# Patient Record
Sex: Male | Born: 1955
Health system: Southern US, Community
[De-identification: ages and names within clinical notes are randomized; demographics above are authoritative.]

## PROBLEM LIST (undated history)

## (undated) DIAGNOSIS — K05329 Chronic periodontitis, generalized, unspecified severity: Secondary | ICD-10-CM

## (undated) DIAGNOSIS — Z7289 Other problems related to lifestyle: Secondary | ICD-10-CM

## (undated) DIAGNOSIS — K089 Disorder of teeth and supporting structures, unspecified: Secondary | ICD-10-CM

## (undated) DIAGNOSIS — M545 Other chronic pain: Secondary | ICD-10-CM

## (undated) DIAGNOSIS — Z789 Other specified health status: Secondary | ICD-10-CM

## (undated) DIAGNOSIS — F1090 Alcohol use, unspecified, uncomplicated: Secondary | ICD-10-CM

## (undated) DIAGNOSIS — K769 Liver disease, unspecified: Secondary | ICD-10-CM

## (undated) DIAGNOSIS — F1410 Cocaine abuse, uncomplicated: Secondary | ICD-10-CM

## (undated) DIAGNOSIS — M1990 Unspecified osteoarthritis, unspecified site: Secondary | ICD-10-CM

## (undated) DIAGNOSIS — M2042 Other hammer toe(s) (acquired), left foot: Secondary | ICD-10-CM

## (undated) DIAGNOSIS — M4307 Spondylolysis, lumbosacral region: Secondary | ICD-10-CM

## (undated) DIAGNOSIS — K219 Gastro-esophageal reflux disease without esophagitis: Secondary | ICD-10-CM

## (undated) DIAGNOSIS — E785 Hyperlipidemia, unspecified: Secondary | ICD-10-CM

## (undated) DIAGNOSIS — F419 Anxiety disorder, unspecified: Secondary | ICD-10-CM

## (undated) DIAGNOSIS — K08109 Complete loss of teeth, unspecified cause, unspecified class: Secondary | ICD-10-CM

## (undated) DIAGNOSIS — G629 Polyneuropathy, unspecified: Secondary | ICD-10-CM

## (undated) DIAGNOSIS — F1210 Cannabis abuse, uncomplicated: Secondary | ICD-10-CM

## (undated) DIAGNOSIS — I700 Atherosclerosis of aorta: Secondary | ICD-10-CM

## (undated) DIAGNOSIS — Z720 Tobacco use: Secondary | ICD-10-CM

## (undated) DIAGNOSIS — M4722 Other spondylosis with radiculopathy, cervical region: Secondary | ICD-10-CM

## (undated) DIAGNOSIS — F1910 Other psychoactive substance abuse, uncomplicated: Secondary | ICD-10-CM

## (undated) DIAGNOSIS — M4712 Other spondylosis with myelopathy, cervical region: Secondary | ICD-10-CM

## (undated) DIAGNOSIS — G8929 Other chronic pain: Secondary | ICD-10-CM

## (undated) DIAGNOSIS — N529 Male erectile dysfunction, unspecified: Secondary | ICD-10-CM

## (undated) DIAGNOSIS — F32A Depression, unspecified: Secondary | ICD-10-CM

## (undated) HISTORY — DX: Liver disease, unspecified: K76.9

## (undated) HISTORY — DX: Hyperlipidemia, unspecified: E78.5

## (undated) HISTORY — DX: Tobacco use: Z72.0

## (undated) HISTORY — DX: Other problems related to lifestyle: Z72.89

## (undated) HISTORY — DX: Polyneuropathy, unspecified: G62.9

## (undated) HISTORY — PX: NO PAST SURGERIES: SHX2092

## (undated) HISTORY — DX: Anxiety disorder, unspecified: F41.9

## (undated) HISTORY — DX: Alcohol use, unspecified, uncomplicated: F10.90

## (undated) HISTORY — DX: Other specified health status: Z78.9

---

## 2002-01-12 ENCOUNTER — Emergency Department (HOSPITAL_COMMUNITY): Admission: EM | Admit: 2002-01-12 | Discharge: 2002-01-12 | Payer: Self-pay | Admitting: Emergency Medicine

## 2002-01-12 ENCOUNTER — Encounter: Payer: Self-pay | Admitting: Emergency Medicine

## 2003-11-16 ENCOUNTER — Emergency Department (HOSPITAL_COMMUNITY): Admission: EM | Admit: 2003-11-16 | Discharge: 2003-11-16 | Payer: Self-pay | Admitting: Emergency Medicine

## 2006-05-22 ENCOUNTER — Emergency Department (HOSPITAL_COMMUNITY): Admission: EM | Admit: 2006-05-22 | Discharge: 2006-05-22 | Payer: Self-pay | Admitting: Emergency Medicine

## 2007-07-08 ENCOUNTER — Emergency Department (HOSPITAL_COMMUNITY): Admission: EM | Admit: 2007-07-08 | Discharge: 2007-07-08 | Payer: Self-pay | Admitting: Emergency Medicine

## 2010-06-22 ENCOUNTER — Other Ambulatory Visit: Payer: Self-pay | Admitting: Emergency Medicine

## 2010-06-22 ENCOUNTER — Ambulatory Visit (HOSPITAL_COMMUNITY)
Admission: RE | Admit: 2010-06-22 | Discharge: 2010-06-22 | Disposition: A | Discharge status: Home / Self Care | Payer: Self-pay | Source: Ambulatory Visit | Attending: Emergency Medicine | Admitting: Emergency Medicine

## 2010-06-22 ENCOUNTER — Other Ambulatory Visit: Payer: Self-pay

## 2010-06-22 ENCOUNTER — Inpatient Hospital Stay (INDEPENDENT_AMBULATORY_CARE_PROVIDER_SITE_OTHER)
Admission: RE | Admit: 2010-06-22 | Discharge: 2010-06-22 | Disposition: A | Discharge status: Home / Self Care | Payer: Self-pay | Source: Ambulatory Visit | Attending: Emergency Medicine | Admitting: Emergency Medicine

## 2010-06-22 ENCOUNTER — Other Ambulatory Visit (HOSPITAL_COMMUNITY): Payer: Self-pay | Admitting: Emergency Medicine

## 2010-06-22 DIAGNOSIS — K2990 Gastroduodenitis, unspecified, without bleeding: Secondary | ICD-10-CM

## 2010-06-22 DIAGNOSIS — R1013 Epigastric pain: Secondary | ICD-10-CM | POA: Insufficient documentation

## 2010-06-22 LAB — POCT URINALYSIS DIPSTICK
Hgb urine dipstick: NEGATIVE
Ketones, ur: NEGATIVE mg/dL
Protein, ur: NEGATIVE mg/dL
Specific Gravity, Urine: 1.02 (ref 1.005–1.030)
Urobilinogen, UA: 0.2 mg/dL (ref 0.0–1.0)
pH: 5 (ref 5.0–8.0)

## 2010-06-22 LAB — LIPASE, BLOOD: Lipase: 28 U/L (ref 11–59)

## 2010-06-26 LAB — POCT I-STAT, CHEM 8
BUN: 9 mg/dL (ref 6–23)
Calcium, Ion: 1.1 mmol/L — ABNORMAL LOW (ref 1.12–1.32)
Chloride: 105 mEq/L (ref 96–112)
Creatinine, Ser: 1.4 mg/dL (ref 0.4–1.5)
Glucose, Bld: 117 mg/dL — ABNORMAL HIGH (ref 70–99)
HCT: 52 % (ref 39.0–52.0)
Potassium: 3.7 mEq/L (ref 3.5–5.1)

## 2010-11-11 ENCOUNTER — Emergency Department (HOSPITAL_COMMUNITY): Payer: Self-pay

## 2010-11-11 ENCOUNTER — Emergency Department (HOSPITAL_COMMUNITY)
Admission: EM | Admit: 2010-11-11 | Discharge: 2010-11-11 | Disposition: A | Discharge status: Home / Self Care | Payer: Self-pay | Attending: Emergency Medicine | Admitting: Emergency Medicine

## 2010-11-11 DIAGNOSIS — S40019A Contusion of unspecified shoulder, initial encounter: Secondary | ICD-10-CM | POA: Insufficient documentation

## 2010-11-11 DIAGNOSIS — S8010XA Contusion of unspecified lower leg, initial encounter: Secondary | ICD-10-CM | POA: Insufficient documentation

## 2010-11-11 DIAGNOSIS — Y92009 Unspecified place in unspecified non-institutional (private) residence as the place of occurrence of the external cause: Secondary | ICD-10-CM | POA: Insufficient documentation

## 2010-11-11 DIAGNOSIS — M549 Dorsalgia, unspecified: Secondary | ICD-10-CM | POA: Insufficient documentation

## 2010-11-11 DIAGNOSIS — W1789XA Other fall from one level to another, initial encounter: Secondary | ICD-10-CM | POA: Insufficient documentation

## 2010-11-11 DIAGNOSIS — M79609 Pain in unspecified limb: Secondary | ICD-10-CM | POA: Insufficient documentation

## 2010-11-11 DIAGNOSIS — M25519 Pain in unspecified shoulder: Secondary | ICD-10-CM | POA: Insufficient documentation

## 2015-02-16 ENCOUNTER — Emergency Department (HOSPITAL_COMMUNITY): Payer: Self-pay

## 2015-02-16 ENCOUNTER — Other Ambulatory Visit: Payer: Self-pay

## 2015-02-16 ENCOUNTER — Emergency Department (HOSPITAL_COMMUNITY)
Admission: EM | Admit: 2015-02-16 | Discharge: 2015-02-16 | Disposition: A | Discharge status: Home / Self Care | Payer: Self-pay | Attending: Emergency Medicine | Admitting: Emergency Medicine

## 2015-02-16 ENCOUNTER — Encounter (HOSPITAL_COMMUNITY): Payer: Self-pay | Admitting: *Deleted

## 2015-02-16 DIAGNOSIS — R200 Anesthesia of skin: Secondary | ICD-10-CM

## 2015-02-16 DIAGNOSIS — M5415 Radiculopathy, thoracolumbar region: Secondary | ICD-10-CM | POA: Insufficient documentation

## 2015-02-16 DIAGNOSIS — Z7982 Long term (current) use of aspirin: Secondary | ICD-10-CM | POA: Insufficient documentation

## 2015-02-16 DIAGNOSIS — G629 Polyneuropathy, unspecified: Secondary | ICD-10-CM | POA: Insufficient documentation

## 2015-02-16 DIAGNOSIS — G930 Cerebral cysts: Secondary | ICD-10-CM | POA: Insufficient documentation

## 2015-02-16 LAB — COMPREHENSIVE METABOLIC PANEL
ALBUMIN: 3.6 g/dL (ref 3.5–5.0)
ALK PHOS: 60 U/L (ref 38–126)
ALT: 67 U/L — ABNORMAL HIGH (ref 17–63)
ANION GAP: 10 (ref 5–15)
AST: 71 U/L — ABNORMAL HIGH (ref 15–41)
BUN: 10 mg/dL (ref 6–20)
CHLORIDE: 108 mmol/L (ref 101–111)
CO2: 23 mmol/L (ref 22–32)
Calcium: 8.8 mg/dL — ABNORMAL LOW (ref 8.9–10.3)
Creatinine, Ser: 1.01 mg/dL (ref 0.61–1.24)
GFR calc non Af Amer: >60 mL/min (ref >60)
GLUCOSE: 104 mg/dL — AB (ref 65–99)
POTASSIUM: 4.1 mmol/L (ref 3.5–5.1)
SODIUM: 141 mmol/L (ref 135–145)
Total Bilirubin: 0.5 mg/dL (ref 0.3–1.2)
Total Protein: 6.2 g/dL — ABNORMAL LOW (ref 6.5–8.1)

## 2015-02-16 LAB — CBC
HCT: 45.4 % (ref 39.0–52.0)
Hemoglobin: 15.6 g/dL (ref 13.0–17.0)
MCH: 31.2 pg (ref 26.0–34.0)
MCHC: 34.4 g/dL (ref 30.0–36.0)
MCV: 90.8 fL (ref 78.0–100.0)
PLATELETS: 199 10*3/uL (ref 150–400)
RBC: 5 MIL/uL (ref 4.22–5.81)
RDW: 15.4 % (ref 11.5–15.5)
WBC: 5.5 10*3/uL (ref 4.0–10.5)

## 2015-02-16 LAB — DIFFERENTIAL
BASOS PCT: 0 %
Basophils Absolute: 0 10*3/uL (ref 0.0–0.1)
EOS PCT: 2 %
Eosinophils Absolute: 0.1 10*3/uL (ref 0.0–0.7)
LYMPHS PCT: 23 %
Lymphs Abs: 1.3 10*3/uL (ref 0.7–4.0)
MONO ABS: 0.7 10*3/uL (ref 0.1–1.0)
Monocytes Relative: 13 %
NEUTROS PCT: 61 %
Neutro Abs: 3.4 10*3/uL (ref 1.7–7.7)

## 2015-02-16 LAB — APTT: aPTT: 25 seconds (ref 24–37)

## 2015-02-16 LAB — I-STAT TROPONIN, ED: Troponin i, poc: 0.01 ng/mL (ref 0.00–0.08)

## 2015-02-16 LAB — PROTIME-INR
INR: 1 (ref 0.00–1.49)
PROTHROMBIN TIME: 13.4 s (ref 11.6–15.2)

## 2015-02-16 MED ORDER — IBUPROFEN 800 MG PO TABS: 800.0000 mg | ORAL_TABLET | Freq: Three times a day (TID) | ORAL | Status: DC

## 2015-02-16 MED ORDER — LORAZEPAM 2 MG/ML IJ SOLN
1.0000 mg | Freq: Once | INTRAMUSCULAR | Status: AC
  Administered 2015-02-16: 1 mg via INTRAVENOUS
  Filled 2015-02-16: qty 1

## 2015-02-16 NOTE — ED Notes (Addendum)
Patient reports left sided weakness and numbness x1 year. Patient reports he decided to come to the ED today because he had someone to keep his children so he was able to get here. Patient ambulated to room, speech clear, resp e/u. Patient alert,oriented x4.

## 2015-02-16 NOTE — ED Notes (Signed)
Pt reports feeling like left arm and leg have been weaker x 1 YEAR. No other complaints, no acute distress noted at triage.

## 2015-02-16 NOTE — ED Notes (Signed)
Patient transported to MRI 

## 2015-02-16 NOTE — Discharge Instructions (Signed)

## 2015-02-16 NOTE — ED Provider Notes (Signed)
CSN: [id number]     Arrival date & time 02/16/15  7829 History   First MD Initiated Contact with Patient 02/16/15 1010     Chief Complaint  Patient presents with  . Weakness     (Consider location/radiation/quality/duration/timing/severity/associated sxs/prior Treatment) HPI   The patient is a 59 year old male, current smoker and daily EtOH user, otherwise denies any past medical history or daily medication, reports emergency department for evaluation of left-sided weakness and numbness which she has had for 1 year. He also complains of incontinence and intermittent numbness and paresthesias shooting from his left leg to his left hip and throughout his pelvis and groin. He also does admit to fecal incontinence. He has some low back pain which she says has been there for some time. He complains of leg weakness and pain which is exacerbated with walking. He is also complaining of pins and needle sensation to his left hand. He denies any trauma or strain to his back. He denies any IV drug use, fever, rash, lightheadedness, slurred speech, facial asymmetry.  History reviewed. No pertinent past medical history. History reviewed. No pertinent past surgical history. History reviewed. No pertinent family history. Social History  Substance Use Topics  . Smoking status: Unknown If Ever Smoked  . Smokeless tobacco: None  . Alcohol Use: No    Review of Systems  10 Systems reviewed and are negative for acute change except as noted in the HPI.     Allergies  Review of patient's allergies indicates no known allergies.  Home Medications   Prior to Admission medications   Medication Sig Start Date End Date Taking? Authorizing Kale Dols  aspirin 81 MG tablet Take 243-324 mg by mouth daily as needed for pain.   Yes Historical Lizzy Hamre, MD  Multiple Vitamin (MULTIVITAMIN WITH MINERALS) TABS tablet Take 1 tablet by mouth daily.   Yes Historical Jovi Alvizo, MD   BP 138/89 mmHg  Pulse 79  Temp(Src)  98 F (36.7 C)  Resp 16  Ht 5' 11.5" (1.816 m)  Wt 168 lb (76.204 kg)  BMI 23.11 kg/m2  SpO2 94% Physical Exam  Constitutional: He is oriented to person, place, and time. He appears well-developed and well-nourished. No distress.  Thin male, appears older than stated age, appears chronically ill but nontoxic-appearing  HENT:  Head: Normocephalic and atraumatic.  Nose: Nose normal.  Mouth/Throat: Oropharynx is clear and moist. No oropharyngeal exudate.  Eyes: Conjunctivae and EOM are normal. Pupils are equal, round, and reactive to light. Right eye exhibits no discharge. Left eye exhibits no discharge. No scleral icterus.  Neck: Normal range of motion. No JVD present. No tracheal deviation present. No thyromegaly present.  Cardiovascular: Normal rate, regular rhythm, normal heart sounds and intact distal pulses.  Exam reveals no gallop and no friction rub.   No murmur heard. 2+ pulses palpated in all extremities, femoral pulses palpated 2+, no bruits auscultated. No carotid bruit.  No pallor  Pulmonary/Chest: Effort normal and breath sounds normal. No respiratory distress. He has no wheezes. He has no rales. He exhibits no tenderness.  Clubbing, no cyanosis  Abdominal: Soft. Bowel sounds are normal. He exhibits no distension and no mass. There is no tenderness. There is no rebound and no guarding.  Musculoskeletal: Normal range of motion. He exhibits no edema or tenderness.  No tenderness. Patient along spinal processes from cervical to lumbar spine. No step-off palpated, no paraspinal tenderness. Normal range of motion range of motion of the back, left and right hip.  No tenderness to the left buttocks, SI joint, IT band.  Lymphadenopathy:    He has no cervical adenopathy.  Neurological: He is alert and oriented to person, place, and time. He has normal reflexes. No cranial nerve deficit. He exhibits normal muscle tone. Coordination normal.  Negative Romberg, normal tone, normal strength  and bilateral lower extremities with dorsiflexion and plantarflexion. Bilateral lower and upper extremity sensation intact to sharp/dull. Patient ambulated without difficulty. Speech is clear and goal oriented.  Cranial nerves II through XII grossly intact.  Skin: Skin is warm and dry. No rash noted. He is not diaphoretic. No erythema. No pallor.  Psychiatric: He has a normal mood and affect. His behavior is normal. Judgment and thought content normal.  Nursing note and vitals reviewed.   ED Course  Procedures (including critical care time) Labs Review Labs Reviewed  COMPREHENSIVE METABOLIC PANEL - Abnormal; Notable for the following:    Glucose, Bld 104 (*)    Calcium 8.8 (*)    Total Protein 6.2 (*)    AST 71 (*)    ALT 67 (*)    All other components within normal limits  PROTIME-INR  APTT  CBC  DIFFERENTIAL  I-STAT TROPOININ, ED    Imaging Review Ct Head Wo Contrast  02/16/2015   CLINICAL DATA:  LEFT leg numbness for 1 year. No known injury. Back pain.  EXAM: CT HEAD WITHOUT CONTRAST  TECHNIQUE: Contiguous axial images were obtained from the base of the skull through the vertex without intravenous contrast.  COMPARISON:  None.  FINDINGS: No acute intracranial hemorrhage. No CT evidence of acute infarction. No midline shift or mass effect. No hydrocephalus. Basilar cisterns are patent.  Fluid density lesion expands the sella turcica measuring 20 mm x 20 mm. This lesion extends superiorly into the suprasellar space slightly RIGHT of midline with the superior aspect of lesion measuring 21 mm x 15 mm on image 13. No evidence of edema or mass effect associated with lesion. No evidence of internal lipid or or calcification.  Paranasal sinuses and  mastoid air cells are clear.  IMPRESSION: 1. No acute scrotal findings. 2. 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 As lesion is indeterminate by CT imaging and no comparison available,  recommend MRI with contrast in nonemergent setting for further evaluation.   Electronically Signed   By: Genevive Bi M.D.   On: 02/16/2015 13:23   I have personally reviewed and evaluated these images and lab results as part of my medical decision-making.   EKG Interpretation   Date/Time:  Wednesday February 16 2015 09:43:39 EDT Ventricular Rate:  96 PR Interval:  142 QRS Duration: 86 QT Interval:  368 QTC Calculation: 464 R Axis:   34 Text Interpretation:  Normal sinus rhythm Minimal voltage criteria for  LVH, may be normal variant Borderline ECG Confirmed by Lincoln Brigham [id number])  on 02/16/2015 11:19:14 AM      MDM   Final diagnoses:  Numbness    Patient with complaint of weakness on his left side which has been unchanged for over a year, associated with left low back pain, some sciatica features, also has intermittent urinary incontinence without any sensation of his loss of bowel or bladder. He is also complaining of paresthesias and peripheral neuropathy symptoms. He does not appear to be healthy male, patient of his neuropathy symptoms are from an occult diabetes or more likely due to frequent alcohol use.  The patient's findings and  history were reviewed with Dr. Madilyn Hook. Given the chronic nature of his complaint, she was consulted to advise on need for MRI to evaluate for cauda equina.  After exam she advised to do head CT and lumbar spine MRI. Her findings were that his weakness and numbness were limited to his left side.  Stroke workup labs were initiated with his presentation.  The patient's lab work as large unremarkable, normal blood counts, normal chemistry. Mildly elevated AST/ALT. Patient has no gap, no course of hyperglycemia, normal kidney function.  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 His head CT is significant for a cystic lesion,  with recommended nonemergent MRI with contrast for follow-up and further evaluation. The patient will be given neurosurgery referral, which may need to [facility] for further management of his chronic back pain and numbness. 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  Patient was notified of all of his findings, return precautions given. Patient verbalized understanding.   Filed Vitals:   02/16/15 1611 02/16/15 1615 02/16/15 1630 02/16/15 1645  BP:  149/96 149/99 150/98  Pulse:  75 71 67  Temp: 98 F (36.7 C)     Resp:      Height:      Weight:      SpO2:  96% 100% 97%     Pt discharged with outpt neurosurgery and neurology follow up arranged with assistance from CM (much appreciated).  Pt given NSAID for back pain.  He has a initial follow-up appointment with internal medicine.      Danelle Berry, PA-C 02/18/15 0319  Tilden Fossa, MD 02/19/15 [phone]

## 2015-02-16 NOTE — ED Notes (Signed)
MRI called stating they were getting patient ready for his scan and patient stated that he was claustrophobic. PA Danelle Berry made aware, advised patient can get  of ativan.

## 2015-02-16 NOTE — Care Management Note (Signed)
Case Management Note  Patient Details  Name: Blaire Hodsdon MRN: [id number] Date of Birth: 03-17-1956  Subjective/Objective:                  59 yo male Pt reports feeling like left arm and leg have been weaker x 1 YEAR. //Home with mother.  Action/Plan: Follow for disposition needs.   Expected Discharge Date:       02/16/15           Expected Discharge Plan:  Home/Self Care  In-House Referral:  PCP / [facility]  Discharge planning Services  CM Consult, Follow-up appt scheduled, [facility] / [facility] (established/new)  Post Acute Care Choice:  NA Choice offered to:  NA  DME Arranged:  N/A DME Agency:     HH Arranged:  NA HH Agency:  NA  Status of Service:  Completed, signed off  Medicare Important Message Given:    Date Medicare IM Given:    Medicare IM give by:    Date Additional Medicare IM Given:    Additional Medicare Important Message give by:     If discussed at [place] of Stay Meetings, dates discussed:    Additional Comments: Baylyn Sickles J. Clydene Laming, RN, BSN, [place] [phone] ED CM consulted regarding PCP establishment and insurance enrollment. Pt presented to [facility] ED today with weakness. NCM met with pt at bedside; pt confirms not having access to f/u care with PCP or insurance coverage. Discussed with patient importance and benefits of establishing PCP, and not utilizing the ED for primary care needs. Pt verbalized understanding and is in agreement. Discussed other options, provided list of local  affordable PCPs.  Pt voiced interest in the [facility] and [place].  NCM advised that [facility]  Internal Medicine providers are seeing pts at [facility]. Pt verbalized understanding. NCM set up appointment with Sharon Seller, NP 03/03/15 at 3:30.  Fuller Mandril, RN 02/16/2015, 1:24 PM

## 2015-03-03 ENCOUNTER — Ambulatory Visit: Payer: Self-pay | Admitting: Family Medicine

## 2015-03-08 ENCOUNTER — Encounter: Payer: Self-pay | Admitting: Family Medicine

## 2015-03-08 ENCOUNTER — Ambulatory Visit (INDEPENDENT_AMBULATORY_CARE_PROVIDER_SITE_OTHER): Payer: Self-pay | Admitting: Family Medicine

## 2015-03-08 VITALS — BP 128/91 | HR 83 | Temp 98.0°F | Resp 16 | Ht 71.0 in | Wt 159.0 lb

## 2015-03-08 DIAGNOSIS — R32 Unspecified urinary incontinence: Secondary | ICD-10-CM | POA: Insufficient documentation

## 2015-03-08 DIAGNOSIS — Z0000 Encounter for general adult medical examination without abnormal findings: Secondary | ICD-10-CM

## 2015-03-08 DIAGNOSIS — R159 Full incontinence of feces: Secondary | ICD-10-CM

## 2015-03-08 DIAGNOSIS — R531 Weakness: Secondary | ICD-10-CM | POA: Insufficient documentation

## 2015-03-08 DIAGNOSIS — G629 Polyneuropathy, unspecified: Secondary | ICD-10-CM

## 2015-03-08 DIAGNOSIS — R748 Abnormal levels of other serum enzymes: Secondary | ICD-10-CM

## 2015-03-08 DIAGNOSIS — M6289 Other specified disorders of muscle: Secondary | ICD-10-CM

## 2015-03-08 DIAGNOSIS — G252 Other specified forms of tremor: Secondary | ICD-10-CM

## 2015-03-08 DIAGNOSIS — R200 Anesthesia of skin: Secondary | ICD-10-CM | POA: Insufficient documentation

## 2015-03-08 DIAGNOSIS — F1010 Alcohol abuse, uncomplicated: Secondary | ICD-10-CM

## 2015-03-08 DIAGNOSIS — Z23 Encounter for immunization: Secondary | ICD-10-CM

## 2015-03-08 DIAGNOSIS — R258 Other abnormal involuntary movements: Secondary | ICD-10-CM

## 2015-03-08 DIAGNOSIS — F17200 Nicotine dependence, unspecified, uncomplicated: Secondary | ICD-10-CM

## 2015-03-08 MED ORDER — VITAMIN B-1 100 MG PO TABS: 100.0000 mg | ORAL_TABLET | Freq: Every day | ORAL | Status: DC

## 2015-03-08 MED ORDER — GABAPENTIN 100 MG PO CAPS: 100.0000 mg | ORAL_CAPSULE | Freq: Three times a day (TID) | ORAL | Status: DC

## 2015-03-08 NOTE — Patient Instructions (Addendum)
[facility] has a walk in clinic on Tuesdays from 9-12. The [place] is [address] [place]. If you cannot make it on Tuesday. Please Call and schedule an appointment with Rebeca Alert at [phone].   Sent medications to [facility] as discussed.   Sent referral to neurology. Recommend that he continue to apply for insurance/charity care. Will call with laboratory results. Peripheral Neuropathy Peripheral neuropathy is a type of nerve damage. It affects nerves that carry signals between the spinal cord and other parts of the body. These are called peripheral nerves. With peripheral neuropathy, one nerve or a group of nerves may be damaged.  CAUSES  Many things can damage peripheral nerves. For some people with peripheral neuropathy, the cause is unknown. Some causes include:  Diabetes. This is the most common cause of peripheral neuropathy.  Injury to a nerve.  Pressure or stress on a nerve that lasts a long time.  Too little vitamin B. Alcoholism can lead to this.  Infections.  Autoimmune diseases, such as multiple sclerosis and systemic lupus erythematosus.  Inherited nerve diseases.  Some medicines, such as cancer drugs.  Toxic substances, such as lead and mercury.  Too little blood flowing to the legs.  Kidney disease.  Thyroid disease. SIGNS AND SYMPTOMS  Different people have different symptoms. The symptoms you have will depend on which of your nerves is damaged. Common symptoms include:  Loss of feeling (numbness) in the feet and hands.  Tingling in the feet and hands.  Pain that burns.  Very sensitive skin.  Weakness.  Not being able to move a part of the body (paralysis).  Muscle twitching.  Clumsiness or poor coordination.  Loss of balance.  Not being able to control your bladder.  Feeling dizzy.  Sexual problems. DIAGNOSIS  Peripheral neuropathy is a symptom, not a  disease. Finding the cause of peripheral neuropathy can be hard. To figure that out, your health care provider will take a medical history and do a physical exam. A neurological exam will also be done. This involves checking things affected by your brain, spinal cord, and nerves (nervous system). For example, your health care provider will check your reflexes, how you move, and what you can feel.  Other types of tests may also be ordered, such as:  Blood tests.  A test of the fluid in your spinal cord.  Imaging tests, such as CT scans or an MRI.  Electromyography (EMG). This test checks the nerves that control muscles.  Nerve conduction velocity tests. These tests check how fast messages pass through your nerves.  Nerve biopsy. A small piece of nerve is removed. It is then checked under a microscope. TREATMENT   Medicine is often used to treat peripheral neuropathy. Medicines may include:  Pain-relieving medicines. Prescription or over-the-counter medicine may be suggested.  Antiseizure medicine. This may be used for pain.  Antidepressants. These also may help ease pain from neuropathy.  Lidocaine. This is a numbing medicine. You might wear a patch or be given a shot.  Mexiletine. This medicine is typically used to help control irregular heart rhythms.  Surgery. Surgery may be needed to relieve pressure on a nerve or to destroy a nerve that is causing pain.  Physical therapy to help movement.  Assistive devices to help movement. HOME CARE INSTRUCTIONS   Only take over-the-counter or prescription medicines as directed by your health care provider. Follow the instructions carefully for any  given medicines. Do not take any other medicines without first getting approval from your health care provider.  If you have diabetes, work closely with your health care provider to keep your blood sugar under control.  If you have numbness in your feet:  Check every day for signs of injury  or infection. Watch for redness, warmth, and swelling.  Wear padded socks and comfortable shoes. These help protect your feet.  Do not do things that put pressure on your damaged nerve.  Do not smoke. Smoking keeps blood from getting to damaged nerves.  Avoid or limit alcohol. Too much alcohol can cause a lack of B vitamins. These vitamins are needed for healthy nerves.  Develop a good support system. Coping with peripheral neuropathy can be stressful. Talk to a mental health specialist or join a support group if you are struggling.  Follow up with your health care provider as directed. SEEK MEDICAL CARE IF:   You have new signs or symptoms of peripheral neuropathy.  You are struggling emotionally from dealing with peripheral neuropathy.  You have a fever. SEEK IMMEDIATE MEDICAL CARE IF:   You have an injury or infection that is not healing.  You feel very dizzy or begin vomiting.  You have chest pain.  You have trouble breathing.   This information is not intended to replace advice given to you by your health care provider. Make sure you discuss any questions you have with your health care provider.   Document Released: 04/27/2002 Document Revised: 01/17/2011 Document Reviewed: 01/12/2013 Elsevier Interactive Patient Education 2016 [employer] Alcohol Use Disorder Alcohol use disorder is a mental disorder. It is not a one-time incident of heavy drinking. Alcohol use disorder is the excessive and uncontrollable use of alcohol over time that leads to problems with functioning in one or more areas of daily living. People with this disorder risk harming themselves and others when they drink to excess. Alcohol use disorder also can cause other mental disorders, such as mood and anxiety disorders, and serious physical problems. People with alcohol use disorder often misuse other drugs.  Alcohol use disorder is common and widespread. Some people with this disorder drink alcohol to  cope with or escape from negative life events. Others drink to relieve chronic pain or symptoms of mental illness. People with a family history of alcohol use disorder are at higher risk of losing control and using alcohol to excess.  Drinking too much alcohol can cause injury, accidents, and health problems. One drink can be too much when you are:  Working.  Pregnant or breastfeeding.  Taking medicines. Ask your doctor.  Driving or planning to drive. SYMPTOMS  Signs and symptoms of alcohol use disorder may include the following:   Consumption ofalcohol inlarger amounts or over a longer period of time than intended.  Multiple unsuccessful attempts to cutdown or control alcohol use.   A great deal of time spent obtaining alcohol, using alcohol, or recovering from the effects of alcohol (hangover).  A strong desire or urge to use alcohol (cravings).   Continued use of alcohol despite problems at work, school, or home because of alcohol use.   Continued use of alcohol despite problems in relationships because of alcohol use.  Continued use of alcohol in situations when it is physically hazardous, such as driving a car.  Continued use of alcohol despite awareness of a physical or psychological problem that is likely related to alcohol use. Physical problems related to alcohol use can involve the  brain, heart, liver, stomach, and intestines. Psychological problems related to alcohol use include intoxication, depression, anxiety, psychosis, delirium, and dementia.   The need for increased amounts of alcohol to achieve the same desired effect, or a decreased effect from the consumption of the same amount of alcohol (tolerance).  Withdrawal symptoms upon reducing or stopping alcohol use, or alcohol use to reduce or avoid withdrawal symptoms. Withdrawal symptoms include:  Racing heart.  Hand tremor.  Difficulty  sleeping.  Nausea.  Vomiting.  Hallucinations.  Restlessness.  Seizures. DIAGNOSIS Alcohol use disorder is diagnosed through an assessment by your health care provider. Your health care provider may start by asking three or four questions to screen for excessive or problematic alcohol use. To confirm a diagnosis of alcohol use disorder, at least two symptoms must be present within a 35-month period. The severity of alcohol use disorder depends on the number of symptoms:  Mild--two or three.  Moderate--four or five.  Severe--six or more. Your health care provider may perform a physical exam or use results from lab tests to see if you have physical problems resulting from alcohol use. Your health care provider may refer you to a mental health professional for evaluation. TREATMENT  Some people with alcohol use disorder are able to reduce their alcohol use to low-risk levels. Some people with alcohol use disorder need to quit drinking alcohol. When necessary, mental health professionals with specialized training in substance use treatment can help. Your health care provider can help you decide how severe your alcohol use disorder is and what type of treatment you need. The following forms of treatment are available:   Detoxification. Detoxification involves the use of prescription medicines to prevent alcohol withdrawal symptoms in the first week after quitting. This is important for people with a history of symptoms of withdrawal and for heavy drinkers who are likely to have withdrawal symptoms. Alcohol withdrawal can be dangerous and, in severe cases, cause death. Detoxification is usually provided in a hospital or in-patient substance use treatment facility.  Counseling or talk therapy. Talk therapy is provided by substance use treatment counselors. It addresses the reasons people use alcohol and ways to keep them from drinking again. The goals of talk therapy are to help people with alcohol  use disorder find healthy activities and ways to cope with life stress, to identify and avoid triggers for alcohol use, and to handle cravings, which can cause relapse.  Medicines.Different medicines can help treat alcohol use disorder through the following actions:  Decrease alcohol cravings.  Decrease the positive reward response felt from alcohol use.  Produce an uncomfortable physical reaction when alcohol is used (aversion therapy).  Support groups. Support groups are run by people who have quit drinking. They provide emotional support, advice, and guidance. These forms of treatment are often combined. Some people with alcohol use disorder benefit from intensive combination treatment provided by specialized substance use treatment centers. Both inpatient and outpatient treatment programs are available.   This information is not intended to replace advice given to you by your health care provider. Make sure you discuss any questions you have with your health care provider.   Document Released: 06/14/2004 Document Revised: 05/28/2014 Document Reviewed: 08/14/2012 Elsevier Interactive Patient Education 2016 Elsevier Inc. Tobacco Use Disorder Tobacco use disorder (TUD) is a mental disorder. It is the long-term use of tobacco in spite of related health problems or difficulty with normal life activities. Tobacco is most commonly smoked as cigarettes and less commonly as cigars or  pipes. Smokeless chewing tobacco and snuff are also popular. People with TUD get a feeling of extreme pleasure (euphoria) from using tobacco and have a desire to use it again and again. Repeated use of tobacco can cause problems. The addictive effects of tobacco are due mainly tothe ingredient nicotine. Nicotine also causes a rush of adrenaline (epinephrine) in the body. This leads to increased blood pressure, heart rate, and breathing rate. These changes may cause problems for people with high blood pressure, weak  hearts, or lung disease. High doses of nicotine in children and pets can lead to seizures and death.  Tobacco contains a number of other unsafe chemicals. These chemicals are especially harmful when inhaled as smoke and can damage almost every organ in the body. Smokers live shorter lives than nonsmokers and are at risk of dying from a number of diseases and cancers. Tobacco smoke can also cause health problems for nonsmokers (due to inhaling secondhand smoke). Smoking is also a fire hazard.  TUD usually starts in the late teenage years and is most common in young adults between the ages of 49 and 25 years. People who start smoking earlier in life are more likely to continue smoking as adults. TUD is somewhat more common in men than women. People with TUD are at higher risk for using alcohol and other drugs of abuse. RISK FACTORS Risk factors for TUD include:   Having family members with the disorder.  Being around people who use tobacco.  Having an existing mental health issue such as schizophrenia, depression, bipolar disorder, ADHD, or posttraumatic stress disorder (PTSD). SIGNS AND SYMPTOMS  People with tobacco use disorder have two or more of the following signs and symptoms within 12 months:   Use of more tobacco over a longer period than intended.   Not able to cut down or control tobacco use.   A lot of time spent obtaining or using tobacco.   Strong desire or urge to use tobacco (craving). Cravings may last for 6 months or longer after quitting.  Use of tobacco even when use leads to major problems at work, school, or home.   Use of tobacco even when use leads to relationship problems.   Giving up or cutting down on important life activities because of tobacco use.   Repeatedly using tobacco in situations where it puts you or others in physical danger, like smoking in bed.   Use of tobacco even when it is known that a physical or mental problem is likely related to  tobacco use.   Physical problems are numerous and may include chronic bronchitis, emphysema, lung and other cancers, gum disease, high blood pressure, heart disease, and stroke.   Mental problems caused by tobacco may include difficulty sleeping and anxiety.  Need to use greater amounts of tobacco to get the same effect. This means you have developed a tolerance.   Withdrawal symptoms as a result of stopping or rapidly cutting back use. These symptoms may last a month or more after quitting and include the following:   Depressed, anxious, or irritable mood.   Difficulty concentrating.   Increased appetite.  Restlessness or trouble sleeping.   Use of tobacco to avoid withdrawal symptoms. DIAGNOSIS  Tobacco use disorder is diagnosed by your health care provider. A diagnosis may be made by:  Your health care provider asking questions about your tobacco use and any problems it may be causing.  A physical exam.  Lab tests.  You may be referred to a  mental health professional or addiction specialist. The severity of tobacco use disorder depends on the number of signs and symptoms you have:   Mild--Two or three symptoms.  Moderate--Four or five symptoms.   Severe--Six or more symptoms.  TREATMENT  Many people with tobacco use disorder are unable to quit on their own and need help. Treatment options include the following:  Nicotine replacement therapy (NRT). NRT provides nicotine without the other harmful chemicals in tobacco. NRT gradually lowers the dosage of nicotine in the body and reduces withdrawal symptoms. NRT is available in over-the-counter forms (gum, lozenges, and skin patches) as well as prescription forms (mouth inhaler and nasal spray).  Medicines.This may include:  Antidepressant medicine that may reduce nicotine cravings.  A medicine that acts on nicotine receptors in the brain to reduce cravings and withdrawal symptoms. It may also block the effects  of tobacco in people with TUD who relapse.  Counseling or talk therapy. A form of talk therapy called behavioral therapy is commonly used to treat people with TUD. Behavioral therapy looks at triggers for tobacco use, how to avoid them, and how to cope with cravings. It is most effective in person or by phone but is also available in self-help forms (books and Internet websites).  Support groups. These provide emotional support, advice, and guidance for quitting tobacco. The most effective treatment for TUD is usually a combination of medicine, talk therapy, and support groups. HOME CARE INSTRUCTIONS  Keep all follow-up visits as directed by your health care provider. This is important.  Take medicines only as directed by your health care provider.  Check with your health care provider before starting new prescription or over-the-counter medicines. SEEK MEDICAL CARE IF:  You are not able to take your medicines as prescribed.  Treatment is not helping your TUD and your symptoms get worse. SEEK IMMEDIATE MEDICAL CARE IF:  You have serious thoughts about hurting yourself or others.  You have trouble breathing, chest pain, sudden weakness, or sudden numbness in part of your body.   This information is not intended to replace advice given to you by your health care provider. Make sure you discuss any questions you have with your health care provider.   Document Released: 01/11/2004 Document Revised: 05/28/2014 Document Reviewed: 07/03/2013 Elsevier Interactive Patient Education [employer]

## 2015-03-08 NOTE — Progress Notes (Signed)
Subjective:    Patient ID: Leonard Ramos, male    DOB: 1955-12-09, 59 y.o.   MRN: [id number]  HPI  [name], a 59 year old male presents to establish care. [name] states that he has never had a primary provider. Patient is currently complaining of left sided numbness and weakness. He was evaluated in the emergency department on 02/16/2015. He has had numbness and weakness for 1 year. [name] is also complaining of burning pain to left side. He states that he is experiencing incontinence of urine and stool. Patient states that he has weakness to upper and lower extremities, which is exacerbated by increased activity. He states that he was prescribed Ibuprofen in the emergency department without relief. He describes pain as burning, with pins and needle sensations to left upper and lower extremities. Patient reports fatigue, denies fever, headache, rash, slurred speech, memory loss, or IV drug use.  [name] reports everyday alcohol use. He states that he drinks two 40 ounce beers per day. He states that he has been drinking every day for many years. He feels guilty about his drinking. He also requires an early morning "eye opener" several times per week. His family has been trying to encourage him to quit. He has not attempted any treatment programs in the past. He states that alcohol intake has increased since being out of work.   Patient also smoke 1 pack of cigarettes per day. He reports that he has tried to quit. He is not interested in smoking cessation at this time.  History reviewed. No pertinent past medical history. Immunization History  Administered Date(s) Administered  . Influenza,inj,Quad PF,36+ Mos 03/08/2015  . Pneumococcal Polysaccharide-23 03/08/2015  .soc Review of Systems  Constitutional: Negative for fatigue.  HENT: Negative.   Eyes: Negative.  Negative for photophobia, pain and redness.  Respiratory: Negative.  Negative for shortness of breath.    Cardiovascular: Negative for chest pain, palpitations and leg swelling.  Gastrointestinal: Negative.   Endocrine: Negative.  Negative for cold intolerance, heat intolerance, polydipsia, polyphagia and polyuria.  Genitourinary: Negative.   Musculoskeletal: Positive for gait problem (left leg "gives out").  Allergic/Immunologic: Negative.  Negative for immunocompromised state.  Neurological: Positive for dizziness, tremors, weakness and numbness. Negative for seizures, syncope, facial asymmetry, speech difficulty and headaches.  Hematological: Negative.   Psychiatric/Behavioral: Positive for suicidal ideas (thoughts of hurting himself in the past, no plan) and decreased concentration. Negative for sleep disturbance. The patient is not nervous/anxious.        Situational depression due to job loss. He reports that he has not worked since June       Objective:   Physical Exam  Constitutional: He appears well-developed.  Non-toxic appearance. He does not have a sickly appearance. No distress.  HENT:  Head: Normocephalic and atraumatic.  Right Ear: Hearing, tympanic membrane, external ear and ear canal normal.  Left Ear: Hearing, tympanic membrane, external ear and ear canal normal.  Mouth/Throat: Uvula is midline and oropharynx is clear and moist. Abnormal dentition. Dental caries (partially edentulous) present.  Eyes: Conjunctivae, EOM and lids are normal. Pupils are equal, round, and reactive to light.  Neck: Trachea normal, normal range of motion and full passive range of motion without pain. Neck supple.  Cardiovascular: Normal rate, regular rhythm and normal pulses.   Pulmonary/Chest: Effort normal and breath sounds normal.  Abdominal: Soft. Normal appearance. There is tenderness in the right upper quadrant.  Musculoskeletal:       Left  shoulder: He exhibits decreased range of motion and decreased strength. He exhibits no spasm and normal pulse.       Left elbow: He exhibits decreased  range of motion. He exhibits no swelling.       Left wrist: He exhibits decreased range of motion. He exhibits no swelling.       Left knee: He exhibits decreased range of motion. He exhibits no swelling and no erythema.  Lymphadenopathy:       Head (right side): No submental and no submandibular adenopathy present.       Head (left side): No submental and no submandibular adenopathy present.  Neurological: He is alert. He is not disoriented. He displays tremor (Bilateral resting tremor). No cranial nerve deficit or sensory deficit. He exhibits abnormal muscle tone (upper extremities). Coordination abnormal.  Skin: Skin is warm, dry and intact.  Psychiatric: He exhibits a depressed mood.      BP 128/91 mmHg  Pulse 83  Temp(Src) 98 F (36.7 C) (Oral)  Resp 16  Ht 5\' 11"  (1.803 m)  Wt 159 lb (72.122 kg)  BMI 22.19 kg/m2 Assessment & Plan:  1. Left sided numbness [name] reports that he has been experiencing left side numbness for greater than 1 year. He was recently evaluated in the emergency department on 02/16/2015. Reviewed CT of head, no acute intercranial hemorrhage noted. However, a lesion was visualized, patient has some new neurological findings including numbness, weakness, imbalance and incontinence. Will send a referral to neurology   - Ambulatory referral to Neurology  2. Left-sided weakness Refer to #1 3. Urinary incontinence, unspecified incontinence type - Ambulatory referral to Neurology - Urinalysis, Complete  4. Bowel incontinence Refer to #1 - Ambulatory referral to Neurology  5. ETOH abuse [facility]. Patient call walk-in on Tuesday, 01/13/2015 from 9am to 12.  - thiamine (VITAMIN B-1) 100 MG tablet; Take 1 tablet (100 mg total) by mouth daily.  Dispense: 30 tablet; Refill: 11 - Ethanol  6. Elevated liver enzymes - Hepatitis panel, acute  7. Routine health maintenance  - TSH - Lipid Panel - COMPLETE METABOLIC PANEL  WITH GFR  8. Neuropathy (HCC) Patient describes numbness, tingling, and periodic burning pain to left side. Will start a trial of neurontin and follow up in office in 1 month.  - gabapentin (NEURONTIN) 100 MG capsule; Take 1 capsule (100 mg total) by mouth 3 (three) times daily.  Dispense: 90 capsule; Refill: 0  9. Fine tremor Will follow up in neurology. Encouraged patient to begin an alcohol treatment program. Referred to [facility].   10. Tobacco dependence Smoking cessation instruction/counseling given:  counseled patient on the dangers of tobacco use, advised patient to stop smoking, and reviewed strategies to maximize success  11. Immunization due  - Pneumococcal polysaccharide vaccine 23-valent greater than or equal to 2yo subcutaneous/IM

## 2015-03-09 ENCOUNTER — Encounter: Payer: Self-pay | Admitting: Family Medicine

## 2015-03-09 DIAGNOSIS — F1010 Alcohol abuse, uncomplicated: Secondary | ICD-10-CM | POA: Insufficient documentation

## 2015-03-09 DIAGNOSIS — R159 Full incontinence of feces: Secondary | ICD-10-CM | POA: Insufficient documentation

## 2015-03-09 LAB — LIPID PANEL
CHOL/HDL RATIO: 2.3 ratio (ref <=5.0)
Cholesterol: 201 mg/dL — ABNORMAL HIGH (ref 125–200)
HDL: 89 mg/dL (ref >=40)
LDL CALC: 99 mg/dL (ref <130)
TRIGLYCERIDES: 67 mg/dL (ref <150)
VLDL: 13 mg/dL (ref <30)

## 2015-03-09 LAB — COMPLETE METABOLIC PANEL WITH GFR
ALT: 36 U/L (ref 9–46)
AST: 48 U/L — ABNORMAL HIGH (ref 10–35)
Albumin: 4.7 g/dL (ref 3.6–5.1)
Alkaline Phosphatase: 56 U/L (ref 40–115)
BUN: 14 mg/dL (ref 7–25)
CALCIUM: 9.6 mg/dL (ref 8.6–10.3)
CO2: 24 mmol/L (ref 20–31)
CREATININE: 1.25 mg/dL (ref 0.70–1.33)
Chloride: 106 mmol/L (ref 98–110)
GFR, EST AFRICAN AMERICAN: 73 mL/min (ref >=60)
GFR, Est Non African American: 64 mL/min (ref >=60)
Glucose, Bld: 102 mg/dL — ABNORMAL HIGH (ref 65–99)
POTASSIUM: 5 mmol/L (ref 3.5–5.3)
Sodium: 141 mmol/L (ref 135–146)
Total Bilirubin: 0.4 mg/dL (ref 0.2–1.2)
Total Protein: 7.6 g/dL (ref 6.1–8.1)

## 2015-03-09 LAB — URINALYSIS, COMPLETE
Bacteria, UA: NONE SEEN [HPF]
Bilirubin Urine: NEGATIVE
Casts: NONE SEEN [LPF]
Crystals: NONE SEEN [HPF]
GLUCOSE, UA: NEGATIVE
Hgb urine dipstick: NEGATIVE
LEUKOCYTES UA: NEGATIVE
NITRITE: NEGATIVE
PH: 5 (ref 5.0–8.0)
Protein, ur: NEGATIVE
RBC / HPF: NONE SEEN RBC/HPF (ref <=2)
SPECIFIC GRAVITY, URINE: 1.015 (ref 1.001–1.035)
Squamous Epithelial / LPF: NONE SEEN [HPF] (ref <=5)
WBC, UA: NONE SEEN WBC/HPF (ref <=5)
YEAST: NONE SEEN [HPF]

## 2015-03-09 LAB — ETHANOL: Alcohol, Ethyl (B): <10 mg/dL (ref 0–10)

## 2015-03-09 LAB — HEPATITIS PANEL, ACUTE
HCV AB: NEGATIVE
HEP B S AG: NEGATIVE
Hep A IgM: NONREACTIVE
Hep B C IgM: NONREACTIVE

## 2015-03-09 LAB — TSH: TSH: 1.097 u[IU]/mL (ref 0.350–4.500)

## 2015-04-08 ENCOUNTER — Encounter: Payer: Self-pay | Admitting: Family Medicine

## 2015-04-09 NOTE — Progress Notes (Signed)
This encounter was created in error - please disregard.

## 2015-04-11 ENCOUNTER — Ambulatory Visit: Payer: Self-pay | Admitting: Neurology

## 2015-05-05 ENCOUNTER — Other Ambulatory Visit: Payer: Self-pay

## 2015-05-05 DIAGNOSIS — F1010 Alcohol abuse, uncomplicated: Secondary | ICD-10-CM

## 2015-05-05 DIAGNOSIS — G629 Polyneuropathy, unspecified: Secondary | ICD-10-CM

## 2015-05-05 MED ORDER — VITAMIN B-1 100 MG PO TABS: 100.0000 mg | ORAL_TABLET | Freq: Every day | ORAL | Status: DC

## 2015-05-05 MED ORDER — GABAPENTIN 100 MG PO CAPS: 100.0000 mg | ORAL_CAPSULE | Freq: Three times a day (TID) | ORAL | Status: DC

## 2015-06-04 ENCOUNTER — Emergency Department (HOSPITAL_COMMUNITY)
Admission: EM | Admit: 2015-06-04 | Discharge: 2015-06-04 | Disposition: A | Discharge status: Home / Self Care | Payer: Self-pay | Attending: Emergency Medicine | Admitting: Emergency Medicine

## 2015-06-04 ENCOUNTER — Encounter (HOSPITAL_COMMUNITY): Payer: Self-pay | Admitting: *Deleted

## 2015-06-04 DIAGNOSIS — F17210 Nicotine dependence, cigarettes, uncomplicated: Secondary | ICD-10-CM | POA: Insufficient documentation

## 2015-06-04 DIAGNOSIS — H5789 Other specified disorders of eye and adnexa: Secondary | ICD-10-CM

## 2015-06-04 DIAGNOSIS — Z79899 Other long term (current) drug therapy: Secondary | ICD-10-CM | POA: Insufficient documentation

## 2015-06-04 DIAGNOSIS — Z7982 Long term (current) use of aspirin: Secondary | ICD-10-CM | POA: Insufficient documentation

## 2015-06-04 DIAGNOSIS — H02846 Edema of left eye, unspecified eyelid: Secondary | ICD-10-CM | POA: Insufficient documentation

## 2015-06-04 MED ORDER — CETIRIZINE HCL 10 MG PO TABS: 10.0000 mg | ORAL_TABLET | Freq: Every day | ORAL | Status: DC

## 2015-06-04 NOTE — ED Provider Notes (Signed)
CSN: [id number]     Arrival date & time 06/04/15  0913 History  By signing my name below, I, Elon SpannerGarrett Cook, attest that this documentation has been prepared under the direction and in the presence of General MillsBenjamin Dedria Endres, PA-C. Electronically Signed: Elon SpannerGarrett Cook ED Scribe. 06/04/2015. 9:18 AM.    Chief Complaint  Patient presents with  . Facial Swelling   The history is provided by the patient. No language interpreter was used.   HPI Comments: Leonard Ramos is a 60 y.o. Ramos who presents to the Emergency Department complaining of non-painful, non-draining, minimally itching swelling around the left eye onset last night without injury or known insect bite.  He does not use any corrective eyewear.  Patient is not followed by ophthalmologist.  Nothing makes the problem better or worse, no interventions tried. He denies vision changes, dizziness, headache, n/v.  NKA.    History reviewed. No pertinent past medical history. History reviewed. No pertinent past surgical history. History reviewed. No pertinent family history. Social History  Substance Use Topics  . Smoking status: Current Every Day Smoker -- 1.00 packs/day    Types: Cigarettes  . Smokeless tobacco: Never Used  . Alcohol Use: 24.0 oz/week    40 Standard drinks or equivalent per week    Review of Systems  A complete 10 system review of systems was obtained and all systems are negative except as noted in the HPI and PMH.   Allergies  Review of patient's allergies indicates no known allergies.  Home Medications   Prior to Admission medications   Medication Sig Start Date End Date Taking? Authorizing Provider  aspirin 81 MG tablet Take 243-324 mg by mouth daily as needed for pain.    Historical Provider, MD  cetirizine (ZYRTEC ALLERGY) 10 MG tablet Take 1 tablet (10 mg total) by mouth daily. 06/04/15   Joycie PeekBenjamin Teddrick Mallari, PA-C  gabapentin (NEURONTIN) 100 MG capsule Take 1 capsule (100 mg total) by mouth 3 (three) times daily.  05/05/15   Massie MaroonLachina M Hollis, FNP  ibuprofen (ADVIL,MOTRIN) 800 MG tablet Take 1 tablet (800 mg total) by mouth 3 (three) times daily. 02/16/15   Danelle BerryLeisa Tapia, PA-C  Multiple Vitamin (MULTIVITAMIN WITH MINERALS) TABS tablet Take 1 tablet by mouth daily.    Historical Provider, MD  thiamine (VITAMIN B-1) 100 MG tablet Take 1 tablet (100 mg total) by mouth daily. 05/05/15   Massie MaroonLachina M Hollis, FNP   BP 143/87 mmHg  Pulse 80  Temp(Src) 97.9 F (36.6 C) (Oral)  Resp 20  SpO2 98% Physical Exam  Constitutional: He is oriented to person, place, and time. He appears well-developed and well-nourished. No distress.  Awake, alert, nontoxic appearance.  HENT:  Head: Normocephalic and atraumatic.  Eyes: Conjunctivae and EOM are normal. Pupils are equal, round, and reactive to light. Right eye exhibits no discharge. Left eye exhibits no discharge.  Very mild, left infraorbital edema. No erythema, tenderness, fluctuance, drainage.  Neck: Neck supple. No tracheal deviation present.  Cardiovascular: Normal rate.   Pulmonary/Chest: Effort normal. No respiratory distress. He exhibits no tenderness.  Abdominal: Soft. There is no tenderness. There is no rebound.  Musculoskeletal: Normal range of motion. He exhibits no tenderness.  Baseline ROM, no obvious new focal weakness.  Neurological: He is alert and oriented to person, place, and time.  Mental status and motor strength appears baseline for patient and situation.  Skin: Skin is warm and dry. No rash noted.  Psychiatric: He has a normal mood and affect. His behavior is  normal.  Nursing note and vitals reviewed.   Visual Acuity  Right Eye Distance: 20/50 Left Eye Distance: 20/50 Bilateral Distance: 20/40  Right Eye Near:   Left Eye Near:    Bilateral Near:     ED Course  Procedures (including critical care time)  DIAGNOSTIC STUDIES: Oxygen Saturation is 98% on RA, normal by my interpretation.    COORDINATION OF CARE:  9:23 AM Discussed plan  to prescribe anti-histamine.  Return precautions advised.  Patient acknowledges and agrees with plan.    Labs Review Labs Reviewed - No data to display  Imaging Review No results found. I have personally reviewed and evaluated these images and lab results as part of my medical decision-making.   EKG Interpretation None       Visual Acuity  Right Eye Distance: 20/50 Left Eye Distance: 20/50 Bilateral Distance: 20/40  Right Eye Near:   Left Eye Near:    Bilateral Near:     MDM  Leonard Ramos is a 60 y.o. Ramos presents for evaluation of left infraorbital swelling. Edema is very mild on exam, nontender. He reports it is mildly itchy. There is no drainage. No concern for ocular infection, or other ocular emergency. Suspect allergy mediated. Will initiate antihistamine and encourage follow-up with PCP next week. No evidence of other acute or emergent pathology. Final diagnoses:  Eye swelling, left   Filed Vitals:   06/04/15 0920  BP: 143/87  Pulse: 80  Temp: 97.9 F (36.6 C)  TempSrc: Oral  Resp: 20  SpO2: 98%   Meds given in ED:  Medications - No data to display  New Prescriptions   CETIRIZINE (ZYRTEC ALLERGY) 10 MG TABLET    Take 1 tablet (10 mg total) by mouth daily.     I personally performed the services described in this documentation, which was scribed in my presence. The recorded information has been reviewed and is accurate.   Joycie Peek, PA-C 06/04/15 5621  Raeford Razor, MD 06/15/15 [phone]

## 2015-06-04 NOTE — Discharge Instructions (Signed)
There does not appear to be an emergent cause for her symptoms at this time. Your symptoms are likely due to allergies. We will start on an antihistamine, and allergy medication. Please follow-up with your doctor as needed. Return to ED for any new or worsening symptoms as we discussed.

## 2015-06-04 NOTE — ED Notes (Signed)
Pt reports swelling to LT eye since last night.  Pt does not wear glasses or contacts.

## 2015-06-04 NOTE — ED Notes (Signed)
Declined W/C at D/C and was escorted to lobby by RN. 

## 2015-06-04 NOTE — ED Notes (Signed)
See PA assessment 

## 2015-06-08 ENCOUNTER — Emergency Department (HOSPITAL_COMMUNITY)
Admission: EM | Admit: 2015-06-08 | Discharge: 2015-06-08 | Disposition: A | Discharge status: Home / Self Care | Payer: Self-pay | Attending: Emergency Medicine | Admitting: Emergency Medicine

## 2015-06-08 ENCOUNTER — Encounter (HOSPITAL_COMMUNITY): Payer: Self-pay | Admitting: Emergency Medicine

## 2015-06-08 DIAGNOSIS — H02846 Edema of left eye, unspecified eyelid: Secondary | ICD-10-CM | POA: Insufficient documentation

## 2015-06-08 DIAGNOSIS — R220 Localized swelling, mass and lump, head: Secondary | ICD-10-CM | POA: Insufficient documentation

## 2015-06-08 DIAGNOSIS — F17210 Nicotine dependence, cigarettes, uncomplicated: Secondary | ICD-10-CM | POA: Insufficient documentation

## 2015-06-08 DIAGNOSIS — Z791 Long term (current) use of non-steroidal anti-inflammatories (NSAID): Secondary | ICD-10-CM | POA: Insufficient documentation

## 2015-06-08 DIAGNOSIS — Z7982 Long term (current) use of aspirin: Secondary | ICD-10-CM | POA: Insufficient documentation

## 2015-06-08 DIAGNOSIS — L299 Pruritus, unspecified: Secondary | ICD-10-CM | POA: Insufficient documentation

## 2015-06-08 DIAGNOSIS — Z79899 Other long term (current) drug therapy: Secondary | ICD-10-CM | POA: Insufficient documentation

## 2015-06-08 NOTE — Discharge Instructions (Signed)
Schedule an appointment with [facility] as soon as possible. Continue taking medications prescribed at last visit. Return to ED with any new, worsening or concerning symptoms.

## 2015-06-08 NOTE — ED Provider Notes (Signed)
CSN: [id number]     Arrival date & time 06/08/15  7829 History  By signing my name below, I, Leonard Ramos, attest that this documentation has been prepared under the direction and in the presence of Leonard Heimlich, PA-C Electronically Signed: Charline Bills, ED Scribe 06/08/2015 at 10:34 AM.   No chief complaint on file.  The history is provided by the patient. No language interpreter was used.   HPI Comments: Leonard Ramos is a 60 y.o. male who presents to the Emergency Department complaining of a persistent, non-draining, non-tender swelling under left eye first noticed 5 days ago. Pt reports associated left infraorbital itching. Symptoms are present upon waking but resolve as the day progresses. Pt was seen in the ED 4 days ago for the same and advised to try Zyrtec which has not provided relief. He denies any change in his symptoms. He did not follow up with his PCP as suggested at last visit. He denies changes in vision, blurred/double vision, eye pain, eye redness, eye drainage, headache, dizziness, nausea, vomiting or any other complaints. He does note that he has had a mass growing on the left cheek for the past year. He has not had this evaluated by a medical provider. It has slowly been growing in size and is slightly tender. He denies drainage or erythema from the mass. Pt does not currently have a PCP but has been seen by [facility] last year.  History reviewed. No pertinent past medical history. History reviewed. No pertinent past surgical history. No family history on file. Social History  Substance Use Topics  . Smoking status: Current Every Day Smoker -- 1.00 packs/day    Types: Cigarettes  . Smokeless tobacco: Never Used  . Alcohol Use: 24.0 oz/week    40 Standard drinks or equivalent per week    Review of Systems  HENT: Positive for facial swelling.   Eyes: Positive for itching. Negative for photophobia, pain, discharge, redness and visual disturbance.   All other systems reviewed and are negative.  Allergies  Review of patient's allergies indicates no known allergies.  Home Medications   Prior to Admission medications   Medication Sig Start Date End Date Taking? Authorizing Provider  aspirin 81 MG tablet Take 243-324 mg by mouth daily as needed for pain.    Historical Provider, MD  cetirizine (ZYRTEC ALLERGY) 10 MG tablet Take 1 tablet (10 mg total) by mouth daily. 06/04/15   Joycie Peek, PA-C  gabapentin (NEURONTIN) 100 MG capsule Take 1 capsule (100 mg total) by mouth 3 (three) times daily. 05/05/15   Massie Maroon, FNP  ibuprofen (ADVIL,MOTRIN) 800 MG tablet Take 1 tablet (800 mg total) by mouth 3 (three) times daily. 02/16/15   Danelle Berry, PA-C  Multiple Vitamin (MULTIVITAMIN WITH MINERALS) TABS tablet Take 1 tablet by mouth daily.    Historical Provider, MD  thiamine (VITAMIN B-1) 100 MG tablet Take 1 tablet (100 mg total) by mouth daily. 05/05/15   Massie Maroon, FNP   BP 131/88 mmHg  Pulse 101  Temp(Src) 97.9 F (36.6 C) (Oral)  Resp 18  Ht  (1.803 m)  Wt 168 lb (76.204 kg)  BMI 23.44 kg/m2  SpO2 96% Physical Exam  Constitutional: He appears well-developed and well-nourished. No distress.  HENT:  Head: Normocephalic and atraumatic. Head is without right periorbital erythema and without left periorbital erythema.    Right Ear: External ear normal.  Left Ear: External ear normal.  2-3 cm cystic appearing mass  noted to left face as indicated in diagram. Mass is slightly fluctuant with no overlying erythema or drainage. Mass is slightly TTP.  Eyes: Conjunctivae, EOM and lids are normal. Pupils are equal, round, and reactive to light. Right eye exhibits no discharge and no hordeolum. Left eye exhibits no discharge and no hordeolum. No scleral icterus.  Very mild left infraorbital swelling. No overlying erythema, fluctuance, induration, drainage, tenderness or other signs of superficial skin infection. Full  painless EOM intact. PERRL. No conjunctival injection. No swelling or erythema of lids. Visual acuity unchanged since previous visit.     Visual Acuity  Right Eye Distance: 20/40 without corrective lens Left Eye Distance: 20/40 without corrective lens Bilateral Distance: 20/40 without corrective lens      Neck: Normal range of motion.  Cardiovascular: Normal rate.   Pulmonary/Chest: Effort normal.  Musculoskeletal: Normal range of motion.  Moves all extremities spontaneously. Walks with a steady gait.   Neurological: He is alert. Coordination normal.  Skin: Skin is warm and dry.  Psychiatric: He has a normal mood and affect. His behavior is normal.  Nursing note and vitals reviewed.  ED Course  Procedures (including critical care time) DIAGNOSTIC STUDIES: Oxygen Saturation is 96% on RA, normal by my interpretation.    COORDINATION OF CARE: 10:30 AM-Discussed treatment plan which includes f/u with [facility] with pt at bedside and pt agreed to plan.   Labs Review Labs Reviewed - No data to display  Imaging Review No results found. I have personally reviewed and evaluated these images and lab results as part of my medical decision-making.   EKG Interpretation None      MDM   Final diagnoses:  Swelling, mass, or lump on face   60 year old male presenting with left infraorbital pruritus and swelling x 5 days. No acute changes in vision. Also noted to have mass to left cheek x 1 year. Seen by ED 4 days ago for same without change. VSS. Very mild swelling to left infraorbital area without signs of infection. No painful EOM. PERRL. Large cystic mass to left cheek without signs of infection. Pt will need to be seen by PCP or possibly dermatology for evaluation of this mass. No signs of infection or need for urgent I&D. This may be contributing to infraorbital symptoms. At this time there does not appear to be any evidence of an acute emergency medical condition  and the patient appears stable for discharge with appropriate outpatient follow up. Encouraged to continue zyrtec for symptom control. Diagnosis was discussed with patient who verbalizes understanding and is agreeable to discharge. Return precautions given in discharge paperwork and discussed with pt at bedside. Pt is stable for discharge.   I personally performed the services described in this documentation, which was scribed in my presence. The recorded information has been reviewed and is accurate.    Leonard Heimlich, PA-C 06/08/15 1119  Blane Ohara, MD 06/08/15 [phone]

## 2015-06-08 NOTE — ED Notes (Signed)
Pt to ED for c/o swelling under left eye. Pt has large abscess-type lesion below left eye. States has been there ~ 1 year and has NOT seen a doctor for it.

## 2015-07-04 ENCOUNTER — Ambulatory Visit: Payer: Self-pay | Admitting: Family Medicine

## 2015-09-05 ENCOUNTER — Encounter: Payer: Self-pay | Admitting: Family Medicine

## 2015-09-05 ENCOUNTER — Ambulatory Visit (INDEPENDENT_AMBULATORY_CARE_PROVIDER_SITE_OTHER): Payer: Self-pay | Admitting: Family Medicine

## 2015-09-05 VITALS — BP 158/96 | HR 88 | Ht 71.0 in | Wt 162.0 lb

## 2015-09-05 DIAGNOSIS — G629 Polyneuropathy, unspecified: Secondary | ICD-10-CM

## 2015-09-05 DIAGNOSIS — F1010 Alcohol abuse, uncomplicated: Secondary | ICD-10-CM

## 2015-09-05 DIAGNOSIS — F17200 Nicotine dependence, unspecified, uncomplicated: Secondary | ICD-10-CM

## 2015-09-05 DIAGNOSIS — R634 Abnormal weight loss: Secondary | ICD-10-CM

## 2015-09-05 DIAGNOSIS — K029 Dental caries, unspecified: Secondary | ICD-10-CM

## 2015-09-05 LAB — COMPLETE METABOLIC PANEL WITH GFR
ALBUMIN: 4.4 g/dL (ref 3.6–5.1)
ALT: 34 U/L (ref 9–46)
AST: 57 U/L — AB (ref 10–35)
Alkaline Phosphatase: 59 U/L (ref 40–115)
BUN: 12 mg/dL (ref 7–25)
CALCIUM: 9.2 mg/dL (ref 8.6–10.3)
CHLORIDE: 104 mmol/L (ref 98–110)
CO2: 22 mmol/L (ref 20–31)
CREATININE: 0.99 mg/dL (ref 0.70–1.33)
GFR, Est African American: >89 mL/min (ref >=60)
GFR, Est Non African American: 84 mL/min (ref >=60)
GLUCOSE: 83 mg/dL (ref 65–99)
Potassium: 4.2 mmol/L (ref 3.5–5.3)
Sodium: 141 mmol/L (ref 135–146)
Total Bilirubin: 0.3 mg/dL (ref 0.2–1.2)
Total Protein: 7.1 g/dL (ref 6.1–8.1)

## 2015-09-05 LAB — HIV ANTIBODY (ROUTINE TESTING W REFLEX): HIV 1&2 Ab, 4th Generation: NONREACTIVE

## 2015-09-05 LAB — CBC WITH DIFFERENTIAL/PLATELET
BASOS PCT: 1 %
Basophils Absolute: 66 cells/uL (ref 0–200)
EOS ABS: 0 {cells}/uL — AB (ref 15–500)
Eosinophils Relative: 0 %
HEMATOCRIT: 46.5 % (ref 38.5–50.0)
Hemoglobin: 15.4 g/dL (ref 13.2–17.1)
LYMPHS ABS: 1782 {cells}/uL (ref 850–3900)
Lymphocytes Relative: 27 %
MCH: 31.1 pg (ref 27.0–33.0)
MCHC: 33.1 g/dL (ref 32.0–36.0)
MCV: 93.9 fL (ref 80.0–100.0)
MONO ABS: 726 {cells}/uL (ref 200–950)
MPV: 10.3 fL (ref 7.5–12.5)
Monocytes Relative: 11 %
NEUTROS ABS: 4026 {cells}/uL (ref 1500–7800)
Neutrophils Relative %: 61 %
Platelets: 265 10*3/uL (ref 140–400)
RBC: 4.95 MIL/uL (ref 4.20–5.80)
RDW: 14.8 % (ref 11.0–15.0)
WBC: 6.6 10*3/uL (ref 3.8–10.8)

## 2015-09-05 MED ORDER — FOLIC ACID 1 MG PO TABS: 1.0000 mg | ORAL_TABLET | Freq: Every day | ORAL | Status: DC

## 2015-09-05 MED ORDER — VITAMIN B-1 100 MG PO TABS: 100.0000 mg | ORAL_TABLET | Freq: Every day | ORAL | Status: DC

## 2015-09-05 MED ORDER — GABAPENTIN 300 MG PO CAPS: 300.0000 mg | ORAL_CAPSULE | Freq: Three times a day (TID) | ORAL | Status: DC

## 2015-09-05 MED FILL — FOLIC ACID 1 MG TABLET: 1 | 30 days supply | Qty: 30 | Fill #0

## 2015-09-05 MED FILL — GABAPENTIN 300 MG CAPSULE: 300 | 30 days supply | Qty: 90 | Fill #0

## 2015-09-05 NOTE — Patient Instructions (Addendum)
[address] [place], [place], [phone]. ADS Triage is M-F 9am-11:30 am   Alcohol Abuse and Nutrition Alcohol abuse is any pattern of alcohol consumption that harms your health, relationships, or work. Alcohol abuse can affect how your body breaks down and absorbs nutrients from food by causing your liver to work abnormally. Additionally, many people who abuse alcohol do not eat enough carbohydrates, protein, fat, vitamins, and minerals. This can cause poor nutrition (malnutrition) and a lack of nutrients (nutrient deficiencies), which can lead to further complications. Nutrients that are commonly lacking (deficient) among people who abuse alcohol include:  Vitamins.  Vitamin A. This is stored in your liver. It is important for your vision, metabolism, and ability to fight off infections (immunity).  B vitamins. These include vitamins such as folate, thiamin, and niacin. These are important in new cell growth and maintenance.  Vitamin C. This plays an important role in iron absorption, wound healing, and immunity.  Vitamin D. This is produced by your liver, but you can also get vitamin D from food. Vitamin D is necessary for your body to absorb and use calcium.  Minerals.  Calcium. This is important for your bones and your heart and blood vessel (cardiovascular) function.  Iron. This is important for blood, muscle, and nervous system functioning.  Magnesium. This plays an important role in muscle and nerve function, and it helps to control blood sugar and blood pressure.  Zinc. This is important for the normal function of your nervous system and digestive system (gastrointestinal tract). Nutrition is an essential component of therapy for alcohol abuse. Your health care provider or dietitian will work with you to design a plan that can help restore nutrients to your body and prevent potential complications. WHAT IS MY PLAN? Your dietitian may develop a specific diet plan  that is based on your condition and any other complications you may have. A diet plan will commonly include:  A balanced diet.  Grains: 6-8 oz per day.  Vegetables: 2-3 cups per day.  Fruits: 1-2 cups per day.  Meat and other protein: 5-6 oz per day.  Dairy: 2-3 cups per day.  Vitamin and mineral supplements. WHAT DO I NEED TO KNOW ABOUT ALCOHOL AND NUTRITION?  Consume foods that are high in antioxidants, such as grapes, berries, nuts, green tea, and dark green and orange vegetables. This can help to counteract some of the stress that is placed on your liver by consuming alcohol.  Avoid food and drinks that are high in fat and sugar. Foods such as sugared soft drinks, salty snack foods, and candy contain empty calories. This means that they lack important nutrients such as protein, fiber, and vitamins.  Eat frequent meals and snacks. Try to eat 5-6 small meals each day.  Eat a variety of fresh fruits and vegetables each day. This will help you get plenty of water, fiber, and vitamins in your diet.  Drink plenty of water and other clear fluids. Try to drink at least 48-64 oz (1.5-2 L) of water per day.  If you are a vegetarian, eat a variety of protein-rich foods. Pair whole grains with plant-based proteins at meals and snacks to obtain the greatest nutrient benefit from your food. For example, eat rice with beans, put peanut butter on whole-grain toast, or eat oatmeal with sunflower seeds.  Soak beans and whole grains overnight before cooking. This can help your body to absorb the nutrients more easily.  Include foods fortified with vitamins and minerals in  your diet. Commonly fortified foods include milk, orange juice, cereal, and bread.  If you are malnourished, your dietitian may recommend a high-protein, high-calorie diet. This may include:  2,000-3,000 calories (kilocalories) per day.  70-100 grams of protein per day.  Your health care provider may recommend a complete  nutritional supplement beverage. This can help to restore calories, protein, and vitamins to your body. Depending on your condition, you may be advised to consume this instead of or in addition to meals.  Limit your intake of caffeine. Replace drinks like coffee and black tea with decaffeinated coffee and herbal tea.  Eat a variety of foods that are high in omega fatty acids. These include fish, nuts and seeds, and soybeans. These foods may help your liver to recover and may also stabilize your mood.  Certain medicines may cause changes in your appetite, taste, and weight. Work with your health care provider and dietitian to make any adjustments to your medicines and diet plan.  Include other healthy lifestyle choices in your daily routine.  Be physically active.  Get enough sleep.  Spend time doing activities that you enjoy.  If you are unable to take in enough food and calories by mouth, your health care provider may recommend a feeding tube. This is a tube that passes through your nose and throat, directly into your stomach. Nutritional supplement beverages can be given to you through the feeding tube to help you get the nutrients you need.  Take vitamin or mineral supplements as recommended by your health care provider. WHAT FOODS CAN I EAT? Grains Enriched pasta. Enriched rice. Fortified whole-grain bread. Fortified whole-grain cereal. Barley. Brown rice. Quinoa. Millet. Vegetables All fresh, frozen, and canned vegetables. Spinach. Kale. Artichoke. Carrots. Winter squash and pumpkin. Sweet potatoes. Broccoli. Cabbage. Cucumbers. Tomatoes. Sweet peppers. Green beans. Peas. Corn. Fruits All fresh and frozen fruits. Berries. Grapes. Mango. Papaya. Guava. Cherries. Apples. Bananas. Peaches. Plums. Pineapple. Watermelon. Cantaloupe. Oranges. Avocado. Meats and Other Protein Sources Beef liver. Lean beef. Pork. Fresh and canned chicken. Fresh fish. Oysters. Sardines. Canned tuna. Shrimp.  Eggs with yolks. Nuts and seeds. Peanut butter. Beans and lentils. Soybeans. Tofu. Dairy Whole, low-fat, and nonfat milk. Whole, low-fat, and nonfat yogurt. Cottage cheese. Sour cream. Hard and soft cheeses. Beverages Water. Herbal tea. Decaffeinated coffee. Decaffeinated green tea. 100% fruit juice. 100% vegetable juice. Instant breakfast shakes. Condiments Ketchup. Mayonnaise. Mustard. Salad dressing. Barbecue sauce. Sweets and Desserts Sugar-free ice cream. Sugar-free pudding. Sugar-free gelatin. Fats and Oils Butter. Vegetable oil, flaxseed oil, olive oil, and walnut oil. Other Complete nutrition shakes. Protein bars. Sugar-free gum. The items listed above may not be a complete list of recommended foods or beverages. Contact your dietitian for more options. WHAT FOODS ARE NOT RECOMMENDED? Grains Sugar-sweetened breakfast cereals. Flavored instant oatmeal. Fried breads. Vegetables Breaded or deep-fried vegetables. Fruits Dried fruit with added sugar. Candied fruit. Canned fruit in syrup. Meats and Other Protein Sources Breaded or deep-fried meats. Dairy Flavored milks. Fried cheese curds or fried cheese sticks. Beverages Alcohol. Sugar-sweetened soft drinks. Sugar-sweetened tea. Caffeinated coffee and tea. Condiments Sugar. Honey. Agave nectar. Molasses. Sweets and Desserts Chocolate. Cake. Cookies. Candy. Other Potato chips. Pretzels. Salted nuts. Candied nuts. The items listed above may not be a complete list of foods and beverages to avoid. Contact your dietitian for more information.   This information is not intended to replace advice given to you by your health care provider. Make sure you discuss any questions you have with your health care  provider.   Document Released: 03/01/2005 Document Revised: 05/28/2014 Document Reviewed: 12/08/2013 Elsevier Interactive Patient Education 2016 [employer]. Alcohol Use Disorder Alcohol use disorder is a mental disorder. It is  not a one-time incident of heavy drinking. Alcohol use disorder is the excessive and uncontrollable use of alcohol over time that leads to problems with functioning in one or more areas of daily living. People with this disorder risk harming themselves and others when they drink to excess. Alcohol use disorder also can cause other mental disorders, such as mood and anxiety disorders, and serious physical problems. People with alcohol use disorder often misuse other drugs.  Alcohol use disorder is common and widespread. Some people with this disorder drink alcohol to cope with or escape from negative life events. Others drink to relieve chronic pain or symptoms of mental illness. People with a family history of alcohol use disorder are at higher risk of losing control and using alcohol to excess.  Drinking too much alcohol can cause injury, accidents, and health problems. One drink can be too much when you are:  Working.  Pregnant or breastfeeding.  Taking medicines. Ask your doctor.  Driving or planning to drive. SYMPTOMS  Signs and symptoms of alcohol use disorder may include the following:   Consumption ofalcohol inlarger amounts or over a longer period of time than intended.  Multiple unsuccessful attempts to cutdown or control alcohol use.   A great deal of time spent obtaining alcohol, using alcohol, or recovering from the effects of alcohol (hangover).  A strong desire or urge to use alcohol (cravings).   Continued use of alcohol despite problems at work, school, or home because of alcohol use.   Continued use of alcohol despite problems in relationships because of alcohol use.  Continued use of alcohol in situations when it is physically hazardous, such as driving a car.  Continued use of alcohol despite awareness of a physical or psychological problem that is likely related to alcohol use. Physical problems related to alcohol use can involve the brain, heart, liver, stomach,  and intestines. Psychological problems related to alcohol use include intoxication, depression, anxiety, psychosis, delirium, and dementia.   The need for increased amounts of alcohol to achieve the same desired effect, or a decreased effect from the consumption of the same amount of alcohol (tolerance).  Withdrawal symptoms upon reducing or stopping alcohol use, or alcohol use to reduce or avoid withdrawal symptoms. Withdrawal symptoms include:  Racing heart.  Hand tremor.  Difficulty sleeping.  Nausea.  Vomiting.  Hallucinations.  Restlessness.  Seizures. DIAGNOSIS Alcohol use disorder is diagnosed through an assessment by your health care provider. Your health care provider may start by asking three or four questions to screen for excessive or problematic alcohol use. To confirm a diagnosis of alcohol use disorder, at least two symptoms must be present within a 68-month period. The severity of alcohol use disorder depends on the number of symptoms:  Mild--two or three.  Moderate--four or five.  Severe--six or more. Your health care provider may perform a physical exam or use results from lab tests to see if you have physical problems resulting from alcohol use. Your health care provider may refer you to a mental health professional for evaluation. TREATMENT  Some people with alcohol use disorder are able to reduce their alcohol use to low-risk levels. Some people with alcohol use disorder need to quit drinking alcohol. When necessary, mental health professionals with specialized training in substance use treatment can help. Your health  care provider can help you decide how severe your alcohol use disorder is and what type of treatment you need. The following forms of treatment are available:   Detoxification. Detoxification involves the use of prescription medicines to prevent alcohol withdrawal symptoms in the first week after quitting. This is important for people with a  history of symptoms of withdrawal and for heavy drinkers who are likely to have withdrawal symptoms. Alcohol withdrawal can be dangerous and, in severe cases, cause death. Detoxification is usually provided in a hospital or in-patient substance use treatment facility.  Counseling or talk therapy. Talk therapy is provided by substance use treatment counselors. It addresses the reasons people use alcohol and ways to keep them from drinking again. The goals of talk therapy are to help people with alcohol use disorder find healthy activities and ways to cope with life stress, to identify and avoid triggers for alcohol use, and to handle cravings, which can cause relapse.  Medicines.Different medicines can help treat alcohol use disorder through the following actions:  Decrease alcohol cravings.  Decrease the positive reward response felt from alcohol use.  Produce an uncomfortable physical reaction when alcohol is used (aversion therapy).  Support groups. Support groups are run by people who have quit drinking. They provide emotional support, advice, and guidance. These forms of treatment are often combined. Some people with alcohol use disorder benefit from intensive combination treatment provided by specialized substance use treatment centers. Both inpatient and outpatient treatment programs are available.   This information is not intended to replace advice given to you by your health care provider. Make sure you discuss any questions you have with your health care provider.   Document Released: 06/14/2004 Document Revised: 05/28/2014 Document Reviewed: 08/14/2012 Elsevier Interactive Patient Education [employer]2016 Elsevier Inc.

## 2015-09-05 NOTE — Progress Notes (Signed)
Subjective:    Patient ID: Leonard Ramos, male    DOB: [birth year], 60 y.o.   MRN: [id number]  HPI  Leonard Ramos, a 60 year old male presents accompanied by children for follow-up. He has missed his last several appointments. He states that it was due to lack of transportation. He has continued to have neuropathy and daily alcohol use.  Patient is currently complaining of left sided numbness and weakness. He was previously  evaluated in the emergency department on 02/16/2015. He has had numbness and weakness for greater 1 year. Leonard Ramos is also complaining of burning pain to left side. Patient states that he has weakness to upper and lower extremities, which is exacerbated by increased activity. He states that he was prescribed Ibuprofen in the emergency department without relief. He describes pain as burning, with pins and needle sensations to left upper and lower extremities. Patient reports fatigue, denies fever, headache, rash, slurred speech, memory loss, or IV drug use.  Leonard Ramos reports everyday alcohol use. He states that he drinks two 40 ounce beers per day. He states that he has been drinking every day for many years. He feels guilty about his drinking. He also requires an early morning "eye opener" several times per week. His family has been trying to encourage him to quit. He has not attempted any treatment programs in the past. He states that alcohol intake has increased since being out of work.   Patient also smoke 1 pack of cigarettes per day. He reports that he has tried to quit. He is not interested in smoking cessation at this time.  Past Medical History  Diagnosis Date  . Alcohol use (HCC)   . Neuropathy (HCC)    Immunization History  Administered Date(s) Administered  . Influenza,inj,Quad PF,36+ Mos 03/08/2015  . Pneumococcal Polysaccharide-23 03/08/2015   Social History   Social History  . Marital Status: Single    Spouse Name: N/A  . Number of Children: N/A   . Years of Education: N/A   Occupational History  . Not on file.   Social History Main Topics  . Smoking status: Current Every Day Smoker -- 1.00 packs/day    Types: Cigarettes  . Smokeless tobacco: Never Used  . Alcohol Use: 24.0 oz/week    40 Standard drinks or equivalent per week  . Drug Use: No  . Sexual Activity:    Partners: Female   Other Topics Concern  . Not on file   Social History Narrative   Review of Systems  Constitutional: Negative for fatigue.  HENT: Negative.   Eyes: Negative.  Negative for photophobia, pain and redness.  Respiratory: Negative.  Negative for shortness of breath.   Cardiovascular: Negative for chest pain, palpitations and leg swelling.  Gastrointestinal: Negative.   Endocrine: Negative.  Negative for cold intolerance, heat intolerance, polydipsia, polyphagia and polyuria.  Genitourinary: Negative.   Musculoskeletal: Positive for gait problem (left leg "gives out").  Allergic/Immunologic: Negative.  Negative for immunocompromised state.  Neurological: Positive for dizziness, tremors, weakness and numbness. Negative for seizures, syncope, facial asymmetry, speech difficulty and headaches.  Hematological: Negative.   Psychiatric/Behavioral: Positive for decreased concentration. Negative for sleep disturbance. The patient is not nervous/anxious.        Objective:   Physical Exam  Constitutional: He appears well-developed.  Non-toxic appearance. He does not have a sickly appearance. No distress.  HENT:  Head: Normocephalic and atraumatic.  Right Ear: Hearing, tympanic membrane, external ear and ear canal normal.  Left Ear: Hearing, tympanic membrane, external ear and ear canal normal.  Mouth/Throat: Uvula is midline and oropharynx is clear and moist. Abnormal dentition. Dental caries (partially edentulous) present.  Eyes: Conjunctivae, EOM and lids are normal. Pupils are equal, round, and reactive to light.  Neck: Trachea normal, normal  range of motion and full passive range of motion without pain. Neck supple.  Cardiovascular: Normal rate, regular rhythm and normal pulses.   Pulmonary/Chest: Effort normal and breath sounds normal.  Abdominal: Soft. Normal appearance. There is tenderness in the right upper quadrant.  Musculoskeletal:       Left shoulder: He exhibits decreased range of motion and decreased strength. He exhibits no spasm and normal pulse.       Left elbow: He exhibits decreased range of motion. He exhibits no swelling.       Left wrist: He exhibits decreased range of motion. He exhibits no swelling.       Left knee: He exhibits decreased range of motion. He exhibits no swelling and no erythema.  Lymphadenopathy:       Head (right side): No submental and no submandibular adenopathy present.       Head (left side): No submental and no submandibular adenopathy present.  Neurological: He is alert. He is not disoriented. He displays tremor (Bilateral resting tremor). No cranial nerve deficit or sensory deficit. He exhibits abnormal muscle tone (upper extremities). Coordination abnormal.  Skin: Skin is warm, dry and intact.      BP 158/96 mmHg  Pulse 88  Ht  (1.803 m)  Wt 162 lb (73.483 kg)  BMI 22.60 kg/m2 Assessment & Plan:   1. ETOH abuse Leonard Ramos continues to drink alcohol daily. Patient is not interested in Alcohol and Drug Services at this time. Patient given information. [address] [place], [place], [phone]. ADS Triage is M-F 9am-11:30 am.  - thiamine (VITAMIN B-1) 100 MG tablet; Take 1 tablet (100 mg total) by mouth daily.  Dispense: 30 tablet; Refill: 6 - folic acid (FOLVITE) 1 MG tablet; Take 1 tablet (1 mg total) by mouth daily.  Dispense: 30 tablet; Refill: 6  2. Neuropathy [facility]) Leonard Ramos reports that he has been experiencing left side numbness for greater than 1 year. He was recently evaluated in the emergency department on 02/16/2015. Reviewed CT of head, no acute  intercranial hemorrhage noted. However, a lesion was visualized, patient has some new neurological findings including numbness, weakness, imbalance and incontinence.  I will increase gabapentin and we will follow up in office in 1 month. Patient was previously referred to neurology for further evaluation, he as been unable to follow due to lack of payer source. He is in the process of applying for Medicaid.   - gabapentin (NEURONTIN) 300 MG capsule; Take 1 capsule (300 mg total) by mouth 3 (three) times daily.  Dispense: 90 capsule; Refill: 0  3. Weight loss - CBC with Differential - HIV antibody (with reflex) - COMPLETE METABOLIC PANEL WITH GFR  4. Tobacco dependence Smoking cessation instruction/counseling given:  counseled patient on the dangers of tobacco use, advised patient to stop smoking, and reviewed strategies to maximize success   5. Dental caries  - Ambulatory referral to Dentistry  RTC: 1 month for neuropathy and ETOH use     Kerrick Miler M, FNP    Massie Maroon, FNP

## 2015-09-06 ENCOUNTER — Encounter: Payer: Self-pay | Admitting: Family Medicine

## 2015-09-06 DIAGNOSIS — G629 Polyneuropathy, unspecified: Secondary | ICD-10-CM | POA: Insufficient documentation

## 2015-09-06 DIAGNOSIS — R634 Abnormal weight loss: Secondary | ICD-10-CM | POA: Insufficient documentation

## 2015-09-06 DIAGNOSIS — F17200 Nicotine dependence, unspecified, uncomplicated: Secondary | ICD-10-CM | POA: Insufficient documentation

## 2015-09-14 ENCOUNTER — Emergency Department (HOSPITAL_COMMUNITY)
Admission: EM | Admit: 2015-09-14 | Discharge: 2015-09-14 | Disposition: A | Discharge status: Home / Self Care | Payer: Self-pay | Attending: Emergency Medicine | Admitting: Emergency Medicine

## 2015-09-14 DIAGNOSIS — W1839XA Other fall on same level, initial encounter: Secondary | ICD-10-CM | POA: Insufficient documentation

## 2015-09-14 DIAGNOSIS — F1010 Alcohol abuse, uncomplicated: Secondary | ICD-10-CM | POA: Insufficient documentation

## 2015-09-14 DIAGNOSIS — Y998 Other external cause status: Secondary | ICD-10-CM | POA: Insufficient documentation

## 2015-09-14 DIAGNOSIS — Z043 Encounter for examination and observation following other accident: Secondary | ICD-10-CM | POA: Insufficient documentation

## 2015-09-14 DIAGNOSIS — Z7982 Long term (current) use of aspirin: Secondary | ICD-10-CM | POA: Insufficient documentation

## 2015-09-14 DIAGNOSIS — W19XXXA Unspecified fall, initial encounter: Secondary | ICD-10-CM

## 2015-09-14 DIAGNOSIS — Z79899 Other long term (current) drug therapy: Secondary | ICD-10-CM | POA: Insufficient documentation

## 2015-09-14 DIAGNOSIS — G629 Polyneuropathy, unspecified: Secondary | ICD-10-CM | POA: Insufficient documentation

## 2015-09-14 DIAGNOSIS — Y9289 Other specified places as the place of occurrence of the external cause: Secondary | ICD-10-CM | POA: Insufficient documentation

## 2015-09-14 DIAGNOSIS — R4182 Altered mental status, unspecified: Secondary | ICD-10-CM | POA: Insufficient documentation

## 2015-09-14 DIAGNOSIS — F17210 Nicotine dependence, cigarettes, uncomplicated: Secondary | ICD-10-CM | POA: Insufficient documentation

## 2015-09-14 DIAGNOSIS — Y9301 Activity, walking, marching and hiking: Secondary | ICD-10-CM | POA: Insufficient documentation

## 2015-09-14 NOTE — ED Notes (Signed)
[facility] Expected date:  Expected time:  Means of arrival:  Comments: EMS- 60yo M, ETOH intoxication/fall

## 2015-09-14 NOTE — ED Notes (Signed)
[facility]. Local construction workers called EMS when patient was seen falling and acting disoriented while walking in his neighborhood. Patient smells of alcohol and is unable to answer questions appropriately. Patient has recently been diagnosed with neuropathy and daughter tells EMS this has complicated his ambulation. He arrives in a C collar. Pt able to tell me his name, says year is 2016, says Trump is president, last drink was light night, says he still feels drunk, endorses that he was walking around outside because it was a beautiful day and tripped. Says he 'is fine' and 'can I go.'

## 2015-09-14 NOTE — Discharge Instructions (Signed)
Eat regular meals and drink fluids. Stop drinking alcohol. Consider AA meetings

## 2015-09-14 NOTE — ED Provider Notes (Signed)
CSN: [id number]     Arrival date & time 09/14/15  1149 History   First MD Initiated Contact with Patient 09/14/15 1251     Chief Complaint  Patient presents with  . Fall  . Alcohol Intoxication  . Altered Mental Status     (Consider location/radiation/quality/duration/timing/severity/associated sxs/prior Treatment) HPI...Marland Kitchen.Marland Kitchen.Patient admits to being intoxicated last night.   He apparently fell in the yard early this morning and EMS was called. He has no specific complaints. He is alert, not confused, ambulatory, no neurological deficits. He admits to having a drinking problem. He has no specific somatic complaints  Past Medical History  Diagnosis Date  . Alcohol use (HCC)   . Neuropathy (HCC)    No past surgical history on file. No family history on file. Social History  Substance Use Topics  . Smoking status: Current Every Day Smoker -- 1.00 packs/day    Types: Cigarettes  . Smokeless tobacco: Never Used  . Alcohol Use: 24.0 oz/week    40 Standard drinks or equivalent per week    Review of Systems  All other systems reviewed and are negative.     Allergies  Review of patient's allergies indicates no known allergies.  Home Medications   Prior to Admission medications   Medication Sig Start Date End Date Taking? Authorizing Provider  aspirin 81 MG tablet Take 243-324 mg by mouth daily as needed for pain.    Historical Provider, MD  cetirizine (ZYRTEC ALLERGY) 10 MG tablet Take 1 tablet (10 mg total) by mouth daily. 06/04/15   Joycie PeekBenjamin Cartner, PA-C  folic acid (FOLVITE) 1 MG tablet Take 1 tablet (1 mg total) by mouth daily. 09/05/15   Massie MaroonLachina M Hollis, FNP  gabapentin (NEURONTIN) 300 MG capsule Take 1 capsule (300 mg total) by mouth 3 (three) times daily. 09/05/15   Massie MaroonLachina M Hollis, FNP  ibuprofen (ADVIL,MOTRIN) 800 MG tablet Take 1 tablet (800 mg total) by mouth 3 (three) times daily. 02/16/15   Danelle BerryLeisa Tapia, PA-C  Multiple Vitamin (MULTIVITAMIN WITH MINERALS) TABS tablet  Take 1 tablet by mouth daily.    Historical Provider, MD  thiamine (VITAMIN B-1) 100 MG tablet Take 1 tablet (100 mg total) by mouth daily. 09/05/15   Massie MaroonLachina M Hollis, FNP   BP 99/67 mmHg  Pulse 99  Temp(Src) 98.2 F (36.8 C) (Oral)  Resp 18  SpO2 93% Physical Exam  Constitutional: He is oriented to person, place, and time.  Ambulatory without neurological deficits  HENT:  Head: Normocephalic and atraumatic.  Eyes: Conjunctivae and EOM are normal. Pupils are equal, round, and reactive to light.  Neck: Normal range of motion. Neck supple.  Cardiovascular: Normal rate and regular rhythm.   Pulmonary/Chest: Effort normal and breath sounds normal.  Abdominal: Soft. Bowel sounds are normal.  Musculoskeletal: Normal range of motion.  Neurological: He is alert and oriented to person, place, and time.  Skin: Skin is warm and dry.  Psychiatric: He has a normal mood and affect. His behavior is normal.  Nursing note and vitals reviewed.   ED Course  Procedures (including critical care time) Labs Review Labs Reviewed - No data to display  Imaging Review No results found. I have personally reviewed and evaluated these images and lab results as part of my medical decision-making.   EKG Interpretation None      MDM   Final diagnoses:  Fall, initial encounter  ETOH abuse    I discussed his drinking problem. He is alert and oriented 3 without neurological  deficits. He does not when any testing done.    Donnetta Hutching, MD 09/14/15 1329

## 2015-10-06 ENCOUNTER — Ambulatory Visit (INDEPENDENT_AMBULATORY_CARE_PROVIDER_SITE_OTHER): Payer: Self-pay | Admitting: Family Medicine

## 2015-10-06 VITALS — BP 121/94 | HR 91 | Temp 98.0°F | Resp 14 | Ht 71.0 in | Wt 160.0 lb

## 2015-10-06 DIAGNOSIS — F411 Generalized anxiety disorder: Secondary | ICD-10-CM

## 2015-10-06 DIAGNOSIS — F17200 Nicotine dependence, unspecified, uncomplicated: Secondary | ICD-10-CM

## 2015-10-06 DIAGNOSIS — G629 Polyneuropathy, unspecified: Secondary | ICD-10-CM

## 2015-10-06 DIAGNOSIS — F1010 Alcohol abuse, uncomplicated: Secondary | ICD-10-CM

## 2015-10-06 DIAGNOSIS — F32A Depression, unspecified: Secondary | ICD-10-CM

## 2015-10-06 DIAGNOSIS — F329 Major depressive disorder, single episode, unspecified: Secondary | ICD-10-CM

## 2015-10-06 MED ORDER — BUSPIRONE HCL 5 MG PO TABS: 5.0000 mg | ORAL_TABLET | Freq: Three times a day (TID) | ORAL | Status: DC

## 2015-10-06 MED ORDER — GABAPENTIN 300 MG PO CAPS: 300.0000 mg | ORAL_CAPSULE | Freq: Three times a day (TID) | ORAL | Status: DC

## 2015-10-06 NOTE — Patient Instructions (Addendum)
[facility] [address]  [place], [place] Walk-in clinic M-F 8am -3pmMajor Depressive Disorder Major depressive disorder is a mental illness. It also may be called clinical depression or unipolar depression. Major depressive disorder usually causes feelings of sadness, hopelessness, or helplessness. Some people with this disorder do not feel particularly sad but lose interest in doing things they used to enjoy (anhedonia). Major depressive disorder also can cause physical symptoms. It can interfere with work, school, relationships, and other normal everyday activities. The disorder varies in severity but is longer lasting and more serious than the sadness we all feel from time to time in our lives. Major depressive disorder often is triggered by stressful life events or major life changes. Examples of these triggers include divorce, loss of your job or home, a move, and the death of a family member or close friend. Sometimes this disorder occurs for no obvious reason at all. People who have family members with major depressive disorder or bipolar disorder are at higher risk for developing this disorder, with or without life stressors. Major depressive disorder can occur at any age. It may occur just once in your life (single episode major depressive disorder). It may occur multiple times (recurrent major depressive disorder). SYMPTOMS People with major depressive disorder have either anhedonia or depressed mood on nearly a daily basis for at least 2 weeks or longer. Symptoms of depressed mood include:  Feelings of sadness (blue or down in the dumps) or emptiness.  Feelings of hopelessness or helplessness.  Tearfulness or episodes of crying (may be observed by others).  Irritability (children and adolescents). In addition to depressed mood or anhedonia or both, people with this disorder have at least four of the following symptoms:  Difficulty sleeping or sleeping too much.    Significant change (increase or decrease) in appetite or weight.   Lack of energy or motivation.  Feelings of guilt and worthlessness.   Difficulty concentrating, remembering, or making decisions.  Unusually slow movement (psychomotor retardation) or restlessness (as observed by others).   Recurrent wishes for death, recurrent thoughts of self-harm (suicide), or a suicide attempt. People with major depressive disorder commonly have persistent negative thoughts about themselves, other people, and the world. People with severe major depressive disorder may experiencedistorted beliefs or perceptions about the world (psychotic delusions). They also may see or hear things that are not real (psychotic hallucinations). DIAGNOSIS Major depressive disorder is diagnosed through an assessment by your health care provider. Your health care provider will ask aboutaspects of your daily life, such as mood,sleep, and appetite, to see if you have the diagnostic symptoms of major depressive disorder. Your health care provider may ask about your medical history and use of alcohol or drugs, including prescription medicines. Your health care provider also may do a physical exam and blood work. This is because certain medical conditions and the use of certain substances can cause major depressive disorder-like symptoms (secondary depression). Your health care provider also may refer you to a mental health specialist for further evaluation and treatment. TREATMENT It is important to recognize the symptoms of major depressive disorder and seek treatment. The following treatments can be prescribed for this disorder:   Medicine. Antidepressant medicines usually are prescribed. Antidepressant medicines are thought to correct chemical imbalances in the brain that are commonly associated with major depressive disorder. Other types of medicine may be added if the symptoms do not respond to antidepressant medicines  alone or if psychotic delusions or hallucinations occur.  Talk therapy. Talk therapy can be helpful in treating major depressive disorder by providing support, education, and guidance. Certain types of talk therapy also can help with negative thinking (cognitive behavioral therapy) and with relationship issues that trigger this disorder (interpersonal therapy). A mental health specialist can help determine which treatment is best for you. Most people with major depressive disorder do well with a combination of medicine and talk therapy. Treatments involving electrical stimulation of the brain can be used in situations with extremely severe symptoms or when medicine and talk therapy do not work over time. These treatments include electroconvulsive therapy, transcranial magnetic stimulation, and vagal nerve stimulation.   This information is not intended to replace advice given to you by your health care provider. Make sure you discuss any questions you have with your health care provider.   Document Released: 09/01/2012 Document Revised: 05/28/2014 Document Reviewed: 09/01/2012 Elsevier Interactive Patient Education 2016 [employer] Generalized Anxiety Disorder Generalized anxiety disorder (GAD) is a mental disorder. It interferes with life functions, including relationships, work, and school. GAD is different from normal anxiety, which everyone experiences at some point in their lives in response to specific life events and activities. Normal anxiety actually helps [place] prepare for and get through these life events and activities. Normal anxiety goes away after the event or activity is over.  GAD causes anxiety that is not necessarily related to specific events or activities. It also causes excess anxiety in proportion to specific events or activities. The anxiety associated with GAD is also difficult to control. GAD can vary from mild to severe. People with severe GAD can have intense waves of anxiety  with physical symptoms (panic attacks).  SYMPTOMS The anxiety and worry associated with GAD are difficult to control. This anxiety and worry are related to many life events and activities and also occur more days than not for 6 months or longer. People with GAD also have three or more of the following symptoms (one or more in children):  Restlessness.   Fatigue.  Difficulty concentrating.   Irritability.  Muscle tension.  Difficulty sleeping or unsatisfying sleep. DIAGNOSIS GAD is diagnosed through an assessment by your health care provider. Your health care provider will ask you questions aboutyour mood,physical symptoms, and events in your life. Your health care provider may ask you about your medical history and use of alcohol or drugs, including prescription medicines. Your health care provider may also do a physical exam and blood tests. Certain medical conditions and the use of certain substances can cause symptoms similar to those associated with GAD. Your health care provider may refer you to a mental health specialist for further evaluation. TREATMENT The following therapies are usually used to treat GAD:   Medication. Antidepressant medication usually is prescribed for long-term daily control. Antianxiety medicines may be added in severe cases, especially when panic attacks occur.   Talk therapy (psychotherapy). Certain types of talk therapy can be helpful in treating GAD by providing support, education, and guidance. A form of talk therapy called cognitive behavioral therapy can teach you healthy ways to think about and react to daily life events and activities.  Stress managementtechniques. These include yoga, meditation, and exercise and can be very helpful when they are practiced regularly. A mental health specialist can help determine which treatment is best for you. Some people see improvement with one therapy. However, other people require a combination of therapies.    This information is not intended to replace advice given to you by  your health care provider. Make sure you discuss any questions you have with your health care provider.   Document Released: 09/01/2012 Document Revised: 05/28/2014 Document Reviewed: 09/01/2012 Elsevier Interactive Patient Education [employer]

## 2015-10-06 NOTE — Progress Notes (Signed)
Subjective:    Patient ID: Leonard Ramos, male    DOB: [birth year], 60 y.o.   MRN: [id number]  HPI  [name], a 60 year old male presents accompanied by daughter Karel Jarvis for follow-up of ETOH abuse and neuropathy.  He has continued to have neuropathy and daily alcohol use.  Patient is currently complaining of left sided numbness and weakness.  He has had numbness and weakness for greater 1 year. [name] is also complaining of burning pain to left side. Patient states that he has weakness to upper and lower extremities, which is exacerbated by increased activity. He states that he was prescribed Ibuprofen in the emergency department without relief. He describes pain as burning, with pins and needle sensations to left upper and lower extremities. Patient denies fever, headache, rash, slurred speech, memory loss, or IV drug use.  [name] reports everyday alcohol use. He states that he drinks two 40 ounce beers per day. He states that he has been drinking every day for many years. He feels guilty about his drinking. He also requires an early morning "eye opener" several times per week. His family has been trying to encourage him to quit. He has not attempted any treatment programs in the past. He states that alcohol intake has increased since being out of work.   Patient is also complaining of anxiety and depression  He has the following symptoms: difficulty concentrating, fatigue, feelings of losing control, racing thoughts, anhedonia. Onset of symptoms was approximately several months ago, and clinical course has worsened.  He denies current suicidal and homicidal ideation.  Patient also smoke 1 pack of cigarettes per day. He reports that he has tried to quit. He is not interested in smoking cessation at this time.  Past Medical History  Diagnosis Date  . Alcohol use (HCC)   . Neuropathy (HCC)    Immunization History  Administered Date(s) Administered  . Influenza,inj,Quad PF,36+  Mos 03/08/2015  . Pneumococcal Polysaccharide-23 03/08/2015   Social History   Social History  . Marital Status: Single    Spouse Name: N/A  . Number of Children: N/A  . Years of Education: N/A   Occupational History  . Not on file.   Social History Main Topics  . Smoking status: Current Every Day Smoker -- 1.00 packs/day    Types: Cigarettes  . Smokeless tobacco: Never Used  . Alcohol Use: 24.0 oz/week    40 Standard drinks or equivalent per week  . Drug Use: No  . Sexual Activity:    Partners: Female   Other Topics Concern  . Not on file   Social History Narrative   Review of Systems  Constitutional: Negative for fatigue.  HENT: Negative.   Eyes: Negative.  Negative for photophobia, pain and redness.  Respiratory: Negative.  Negative for shortness of breath.   Cardiovascular: Negative for chest pain, palpitations and leg swelling.  Gastrointestinal: Negative.   Endocrine: Negative.  Negative for cold intolerance, heat intolerance, polydipsia, polyphagia and polyuria.  Genitourinary: Negative.   Musculoskeletal: Positive for gait problem (left leg "gives out").  Skin: Negative.   Allergic/Immunologic: Negative.  Negative for immunocompromised state.  Neurological: Positive for dizziness, tremors, weakness and numbness. Negative for seizures, syncope, facial asymmetry, speech difficulty and headaches.  Hematological: Negative.   Psychiatric/Behavioral: Positive for decreased concentration. Negative for sleep disturbance. The patient is nervous/anxious.        Objective:   Physical Exam  Constitutional: He appears well-developed.  Non-toxic appearance. He does  not have a sickly appearance. No distress.  HENT:  Head: Normocephalic and atraumatic.  Right Ear: Hearing, tympanic membrane, external ear and ear canal normal.  Left Ear: Hearing, tympanic membrane, external ear and ear canal normal.  Mouth/Throat: Uvula is midline and oropharynx is clear and moist.  Abnormal dentition. Dental caries (partially edentulous) present.  Eyes: Conjunctivae, EOM and lids are normal. Pupils are equal, round, and reactive to light.  Neck: Trachea normal, normal range of motion and full passive range of motion without pain. Neck supple.  Cardiovascular: Normal rate, regular rhythm and normal pulses.   Pulmonary/Chest: Effort normal and breath sounds normal.  Abdominal: Soft. Normal appearance. There is tenderness in the right upper quadrant.  Musculoskeletal:       Left shoulder: He exhibits decreased strength. He exhibits no spasm and normal pulse.       Left elbow: He exhibits no swelling.       Left wrist: He exhibits decreased range of motion. He exhibits no swelling.       Left knee: He exhibits no swelling and no erythema.  Lymphadenopathy:       Head (right side): No submental and no submandibular adenopathy present.       Head (left side): No submental and no submandibular adenopathy present.  Neurological: He is alert. He is not disoriented. He displays tremor (Bilateral resting tremor). No cranial nerve deficit or sensory deficit. He exhibits abnormal muscle tone (upper extremities). Coordination abnormal.  Skin: Skin is warm, dry and intact.  Psychiatric: His mood appears anxious. He exhibits a depressed mood (tearful).      BP 121/94 mmHg  Pulse 91  Temp(Src) 98 F (36.7 C) (Oral)  Resp 14  Ht  (1.803 m)  Wt 160 lb (72.576 kg)  BMI 22.33 kg/m2  SpO2 97% Assessment & Plan:   1. ETOH abuse Mr. [name] continues to drink alcohol daily. Patient is not interested in Alcohol and Drug Services at this time. Patient given information. [address] [place], [place], [phone]. [facility] Triage is M-F 9am-11:30 am.  - thiamine (VITAMIN B-1) 100 MG tablet; Take 1 tablet (100 mg total) by mouth daily.  Dispense: 30 tablet; Refill: 6 - folic acid (FOLVITE) 1 MG tablet; Take 1 tablet (1 mg total) by mouth daily.  Dispense: 30 tablet; Refill:  6  2. Neuropathy [facility]) [name] reports that he has been experiencing left side numbness for greater than 1 year. He was recently evaluated in the emergency department on 02/16/2015. Reviewed CT of head, no acute intercranial hemorrhage noted. However, a lesion was visualized, patient has some new neurological findings including numbness, weakness, imbalance and incontinence.  I will increase gabapentin and we will follow up in office in 1 month. Patient was previously referred to neurology for further evaluation, he as been unable to follow due to lack of payer source. He is in the process of applying for Medicaid.   - gabapentin (NEURONTIN) 300 MG capsule; Take 1 capsule (300 mg total) by mouth 3 (three) times daily.  Dispense: 90 capsule; Refill: 2  3. Generalized anxiety disorder GAD 7 : Generalized Anxiety Score 10/07/2015  Nervous, Anxious, on Edge 3  Control/stop worrying 2  Worry too much - different things 2  Trouble relaxing 2  Restless 2  Easily annoyed or irritable 2  Afraid - awful might happen 2  Total GAD 7 Score 15  Anxiety Difficulty Somewhat difficult     - busPIRone (BUSPAR) 5 MG tablet;  Take 1 tablet (5 mg total) by mouth 3 (three) times daily.  Dispense: 60 tablet; Refill: 1  4. Depression [facility] screen [facility] 2[facility]9 10/06/2015 10/06/2015 03/08/2015 03/08/2015  Decreased Interest 0 2 1 0  Down, Depressed, Hopeless 0 2 - 0  PHQ - 2 Score 0 4 1 0  Altered sleeping - 1 - -  Tired, decreased energy - 1 - -  Change in appetite - 2 - -  Feeling bad or failure about yourself  - 2 - -  Trouble concentrating - 1 - -  PHQ-9 Score - 11 - -  Difficult doing work/chores - Somewhat difficult - -    - busPIRone (BUSPAR) 5 MG tablet; Take 1 tablet (5 mg total) by mouth 3 (three) times daily.  Dispense: 60 tablet; Refill: 1  5. Tobacco dependence Smoking cessation instruction/counseling given:  counseled patient on the dangers of tobacco use, advised patient to stop smoking,  and reviewed strategies to maximize success     RTC: 1 month for neuropathy, ETOH abuse, and depression   Karriem Muench M, FNP        Massie MaroonHollis,Javarius Tsosie M, FNP

## 2015-10-07 ENCOUNTER — Encounter: Payer: Self-pay | Admitting: Family Medicine

## 2015-10-07 DIAGNOSIS — F32A Depression, unspecified: Secondary | ICD-10-CM | POA: Insufficient documentation

## 2015-10-07 DIAGNOSIS — F329 Major depressive disorder, single episode, unspecified: Secondary | ICD-10-CM | POA: Insufficient documentation

## 2015-10-07 DIAGNOSIS — F411 Generalized anxiety disorder: Secondary | ICD-10-CM | POA: Insufficient documentation

## 2015-11-07 ENCOUNTER — Ambulatory Visit: Payer: Self-pay | Admitting: Family Medicine

## 2015-12-07 ENCOUNTER — Encounter: Payer: Self-pay | Admitting: Family Medicine

## 2015-12-07 ENCOUNTER — Ambulatory Visit (INDEPENDENT_AMBULATORY_CARE_PROVIDER_SITE_OTHER): Payer: Self-pay | Admitting: Family Medicine

## 2015-12-07 VITALS — BP 122/87 | HR 94 | Temp 98.6°F | Resp 14 | Ht 71.0 in | Wt 158.0 lb

## 2015-12-07 DIAGNOSIS — R200 Anesthesia of skin: Secondary | ICD-10-CM

## 2015-12-07 DIAGNOSIS — M6289 Other specified disorders of muscle: Secondary | ICD-10-CM

## 2015-12-07 DIAGNOSIS — F17200 Nicotine dependence, unspecified, uncomplicated: Secondary | ICD-10-CM

## 2015-12-07 DIAGNOSIS — F1010 Alcohol abuse, uncomplicated: Secondary | ICD-10-CM

## 2015-12-07 DIAGNOSIS — R531 Weakness: Secondary | ICD-10-CM

## 2015-12-07 DIAGNOSIS — G629 Polyneuropathy, unspecified: Secondary | ICD-10-CM

## 2015-12-07 MED ORDER — GABAPENTIN 300 MG PO CAPS: 300.0000 mg | ORAL_CAPSULE | Freq: Three times a day (TID) | ORAL | Status: DC

## 2015-12-07 MED FILL — GABAPENTIN 300 MG CAPSULE: 300 | 30 days supply | Qty: 90 | Fill #0

## 2015-12-07 NOTE — Patient Instructions (Signed)
[facility] except for gabapentin. I have sent that in. Check with social services about medicaid. We would like to send you to a neurologist. Continue to work on cutting down on alcohol and tobacco. Try to get your Buspar for anxiety.

## 2015-12-07 NOTE — Progress Notes (Signed)
Patient ID: Leonard Ramos, male   DOB: 1955/09/30, 59 y.o.   MRN: [id number]   Leonard Ramos, is a 60 y.o. male  [id number]  [id number]  DOB - Oct 18, 1955  CC:  Chief Complaint  Patient presents with  . Follow-up    neuropathy , feeling worse          HPI: Leonard Ramos is a 60 y.o. male here for  follow-up of numbness and weakness of his left side. This has been present for some time. He was seen by Mrs. Hart Rochester about a month ago and was prescribed gabapentin. He was asked to follow-up in one month. He was also prescribed Buspar for anxiety but has not filled that. He is unclear as to how much the gabapentin is helping but would like a refill. He continues to take folic acid, thiamine and MVIs. He continues to drink at least 2 large beers daily and smokes a pack of cigarettes every two days.  He is not interested in quitting. He has been provide with information about [facility]. He does continue to smoke marijuana.   No Known Allergies Past Medical History  Diagnosis Date  . Alcohol use (HCC)   . Neuropathy [facility])    Current Outpatient Prescriptions on File Prior to Visit  Medication Sig Dispense Refill  . aspirin 81 MG tablet Take 243-324 mg by mouth daily as needed for pain.    . busPIRone (BUSPAR) 5 MG tablet Take 1 tablet (5 mg total) by mouth 3 (three) times daily. 60 tablet 1  . cetirizine (ZYRTEC ALLERGY) 10 MG tablet Take 1 tablet (10 mg total) by mouth daily. 30 tablet 1  . folic acid (FOLVITE) 1 MG tablet Take 1 tablet (1 mg total) by mouth daily. 30 tablet 6  . ibuprofen (ADVIL,MOTRIN) 800 MG tablet Take 1 tablet (800 mg total) by mouth 3 (three) times daily. 60 tablet 0  . Multiple Vitamin (MULTIVITAMIN WITH MINERALS) TABS tablet Take 1 tablet by mouth daily.    Marland Kitchen thiamine (VITAMIN B-1) 100 MG tablet Take 1 tablet (100 mg total) by mouth daily. 30 tablet 6   No current facility-administered medications on file prior to visit.   History  reviewed. No pertinent family history. Social History   Social History  . Marital Status: Single    Spouse Name: N/A  . Number of Children: N/A  . Years of Education: N/A   Occupational History  . Not on file.   Social History Main Topics  . Smoking status: Current Every Day Smoker -- 0.50 packs/day    Types: Cigarettes  . Smokeless tobacco: Never Used  . Alcohol Use: 24.0 oz/week    40 Standard drinks or equivalent per week     Comment: 1-2 dsily  . Drug Use: No  . Sexual Activity:    Partners: Female   Other Topics Concern  . Not on file   Social History Narrative    Review of Systems: Constitutional: Negative for weight change, appetite change. Positive for fatigue Skin: Negative for ulcerations. Eyes: Negative pain, visual disturbance Respiratory: Negative for cough, shortness of breath,   Cardiovascular: Negative for chest pain, palpitations, pedal edema  Abdominal:Negative for nausea, vomiting, diarhea,constipation Genitourinary: Negative for frequency, urgency, polyuria Neurological: Positive for numbness, tingling, tremors, weakness on left. Positive for gait disturbance Psychiatric/Behavioral: Negative for depression, anxiety, confusion   Objective:   Filed Vitals:   12/07/15 0808  BP: 122/87  Pulse: 94  Temp: 98.6 F (37 C)  Resp: 14    Physical Exam: Constitutional: Patient appears well-developed and well-nourished. No distress. HENT: Normocephalic, atraumatic. Oropharynx is clear and moist. Abnormal dentition Eyes: Conjunctivae and EOM are normal. PERRLA, no scleral icterus. Neck: Normal ROM. Neck supple. No lymphadenopathy, No thyromegaly. CVS: RRR, S1/S2 +, no murmurs, no gallops, no rubs Pulmonary: Effort and breath sounds normal, no stridor, rhonchi, wheezes, rales.  Abdominal: Soft. Normoactive BS,, no distension, tenderness, rebound or guarding.  Musculoskeletal: Positive for decreased ROM and strength in left upper extremity. No edema and  no tenderness.  Neuro: Alert.Normal muscle tone coordination. Non-focal Skin: Skin is warm and dry. No rash noted. Not diaphoretic. No erythema. No pallor. Psychiatric: Normal mood and affect. Behavior, judgment, thought content normal.  Lab Results  Component Value Date   WBC 6.6 09/05/2015   HGB 15.4 09/05/2015   HCT 46.5 09/05/2015   MCV 93.9 09/05/2015   PLT 265 09/05/2015   Lab Results  Component Value Date   CREATININE 0.99 09/05/2015   BUN 12 09/05/2015   NA 141 09/05/2015   K 4.2 09/05/2015   CL 104 09/05/2015   CO2 22 09/05/2015    No results found for: HGBA1C Lipid Panel     Component Value Date/Time   CHOL 201* 03/08/2015 1423   TRIG 67 03/08/2015 1423   HDL 89 03/08/2015 1423   CHOLHDL 2.3 03/08/2015 1423   VLDL 13 03/08/2015 1423   LDLCALC 99 03/08/2015 1423       Assessment and plan:   1. Neuropathy (HCC)  - gabapentin (NEURONTIN) 300 MG capsule; Take 1 capsule (300 mg total) by mouth 3 (three) times daily.  Dispense: 90 capsule; Refill: 2  2. ETOH abuse -urged to decrease use  3. Tobacco dependence -urged to work on stopping.  4. Left sided numbness  - gabapentin (NEURONTIN) 300 MG capsule; Take 1 capsule (300 mg total) by mouth 3 (three) times daily.  Dispense: 90 capsule; Refill: 2  5. Left-sided weakness  - gabapentin (NEURONTIN) 300 MG capsule; Take 1 capsule (300 mg total) by mouth 3 (three) times daily.  Dispense: 90 capsule; Refill: 2  6. Anxiety -encouraged to pick up and start Buspar.  7. Lack of financial resources. -Have encouraged him to follow-up with social services about medicaid.   Return in about 2 months (around 02/07/2016).  The patient was given clear instructions to go to ER or return to medical center if symptoms don't improve, worsen or new problems develop. The patient verbalized understanding       12/07/2015, 8:50 AM

## 2016-03-08 ENCOUNTER — Ambulatory Visit (INDEPENDENT_AMBULATORY_CARE_PROVIDER_SITE_OTHER): Payer: Self-pay | Admitting: Family Medicine

## 2016-03-08 ENCOUNTER — Encounter: Payer: Self-pay | Admitting: Family Medicine

## 2016-03-08 VITALS — BP 111/87 | HR 98 | Temp 97.8°F | Resp 16 | Ht 71.0 in | Wt 158.0 lb

## 2016-03-08 DIAGNOSIS — F32A Depression, unspecified: Secondary | ICD-10-CM

## 2016-03-08 DIAGNOSIS — Z23 Encounter for immunization: Secondary | ICD-10-CM

## 2016-03-08 DIAGNOSIS — F411 Generalized anxiety disorder: Secondary | ICD-10-CM

## 2016-03-08 DIAGNOSIS — F329 Major depressive disorder, single episode, unspecified: Secondary | ICD-10-CM

## 2016-03-08 DIAGNOSIS — F1010 Alcohol abuse, uncomplicated: Secondary | ICD-10-CM

## 2016-03-08 DIAGNOSIS — Z0000 Encounter for general adult medical examination without abnormal findings: Secondary | ICD-10-CM

## 2016-03-08 LAB — POCT URINALYSIS DIP (DEVICE)
Bilirubin Urine: NEGATIVE
Glucose, UA: NEGATIVE mg/dL
HGB URINE DIPSTICK: NEGATIVE
Ketones, ur: NEGATIVE mg/dL
LEUKOCYTES UA: NEGATIVE
NITRITE: NEGATIVE
PH: 5 (ref 5.0–8.0)
PROTEIN: NEGATIVE mg/dL
Specific Gravity, Urine: 1.025 (ref 1.005–1.030)
UROBILINOGEN UA: 0.2 mg/dL (ref 0.0–1.0)

## 2016-03-08 LAB — CBC WITH DIFFERENTIAL/PLATELET
BASOS ABS: 0 {cells}/uL (ref 0–200)
Basophils Relative: 0 %
EOS ABS: 55 {cells}/uL (ref 15–500)
Eosinophils Relative: 1 %
HEMATOCRIT: 51.7 % — AB (ref 38.5–50.0)
HEMOGLOBIN: 16.9 g/dL (ref 13.2–17.1)
LYMPHS ABS: 1485 {cells}/uL (ref 850–3900)
Lymphocytes Relative: 27 %
MCH: 30.7 pg (ref 27.0–33.0)
MCHC: 32.7 g/dL (ref 32.0–36.0)
MCV: 93.8 fL (ref 80.0–100.0)
MONO ABS: 605 {cells}/uL (ref 200–950)
MPV: 9.6 fL (ref 7.5–12.5)
Monocytes Relative: 11 %
NEUTROS PCT: 61 %
Neutro Abs: 3355 cells/uL (ref 1500–7800)
Platelets: 216 10*3/uL (ref 140–400)
RBC: 5.51 MIL/uL (ref 4.20–5.80)
RDW: 14.6 % (ref 11.0–15.0)
WBC: 5.5 10*3/uL (ref 3.8–10.8)

## 2016-03-08 LAB — COMPLETE METABOLIC PANEL WITH GFR
ALBUMIN: 4.1 g/dL (ref 3.6–5.1)
ALT: 14 U/L (ref 9–46)
AST: 21 U/L (ref 10–35)
Alkaline Phosphatase: 67 U/L (ref 40–115)
BUN: 13 mg/dL (ref 7–25)
CHLORIDE: 105 mmol/L (ref 98–110)
CO2: 23 mmol/L (ref 20–31)
Calcium: 9.5 mg/dL (ref 8.6–10.3)
Creat: 0.97 mg/dL (ref 0.70–1.33)
GFR, Est African American: >89 mL/min (ref >=60)
GFR, Est Non African American: 86 mL/min (ref >=60)
GLUCOSE: 84 mg/dL (ref 65–99)
POTASSIUM: 4.7 mmol/L (ref 3.5–5.3)
SODIUM: 139 mmol/L (ref 135–146)
Total Bilirubin: 0.4 mg/dL (ref 0.2–1.2)
Total Protein: 7 g/dL (ref 6.1–8.1)

## 2016-03-08 LAB — LIPID PANEL
Cholesterol: 192 mg/dL (ref 125–200)
HDL: 54 mg/dL (ref >=40)
LDL CALC: 114 mg/dL (ref <130)
Total CHOL/HDL Ratio: 3.6 Ratio (ref <=5.0)
Triglycerides: 119 mg/dL (ref <150)
VLDL: 24 mg/dL (ref <30)

## 2016-03-08 MED ORDER — BUSPIRONE HCL 5 MG PO TABS: 5.0000 mg | ORAL_TABLET | Freq: Two times a day (BID) | ORAL | 1 refills | Status: DC

## 2016-03-08 MED ORDER — FOLIC ACID 1 MG PO TABS: 1.0000 mg | ORAL_TABLET | Freq: Every day | ORAL | 6 refills | Status: DC

## 2016-03-08 NOTE — Progress Notes (Signed)
Leonard Ramos, is a 60 y.o. male  [id number]  [id number]  DOB - 1955/08/28  CC:  Chief Complaint  Patient presents with  . Anxiety  . Numbness    right side numbness pain        HPI: Leonard Ramos is a 60 y.o. male here for follow-up. His last visit was with me about 3 months. His health issues are numbness and perceived weakness on the left side. He has been prescribed gabapentin but does not feel this is helping. Today he complains of pain in his right side at the level of the waist. He denies any other symptoms related to that. He has a history of tobacco use and still smokes about 1/2 pack of cigarettes daily and is not ready to quit. He admits to drinking 2 beers a day and occ smoking marijuana.  He has been prescribed Buspar for anxiety but has not filled or started this.   Health Maintenance: Accepts  Tdap and influenza today. We will do prostate and colon cancer screening today.   No Known Allergies Past Medical History:  Diagnosis Date  . Alcohol use   . Neuropathy (HCC)    Current Outpatient Prescriptions on File Prior to Visit  Medication Sig Dispense Refill  . gabapentin (NEURONTIN) 300 MG capsule Take 1 capsule (300 mg total) by mouth 3 (three) times daily. 90 capsule 2  . thiamine (VITAMIN B-1) 100 MG tablet Take 1 tablet (100 mg total) by mouth daily. 30 tablet 6  . aspirin 81 MG tablet Take 243-324 mg by mouth daily as needed for pain.    . cetirizine (ZYRTEC ALLERGY) 10 MG tablet Take 1 tablet (10 mg total) by mouth daily. (Patient not taking: Reported on 03/08/2016) 30 tablet 1  . ibuprofen (ADVIL,MOTRIN) 800 MG tablet Take 1 tablet (800 mg total) by mouth 3 (three) times daily. (Patient not taking: Reported on 03/08/2016) 60 tablet 0  . Multiple Vitamin (MULTIVITAMIN WITH MINERALS) TABS tablet Take 1 tablet by mouth daily.     No current facility-administered medications on file prior to visit.    History reviewed. No pertinent family  history. Social History   Social History  . Marital status: Single    Spouse name: N/A  . Number of children: N/A  . Years of education: N/A   Occupational History  . Not on file.   Social History Main Topics  . Smoking status: Current Every Day Smoker    Packs/day: 0.50    Types: Cigarettes  . Smokeless tobacco: Never Used  . Alcohol use 24.0 oz/week    40 Standard drinks or equivalent per week     Comment: 1-2 dsily  . Drug use: No  . Sexual activity: Yes    Partners: Female   Other Topics Concern  . Not on file   Social History Narrative  . No narrative on file    Review of Systems: Constitutional: Negative Skin: Negative HENT: Negative  Eyes: Negative  Neck: Negative Respiratory: Negative Cardiovascular: Negative Gastrointestinal: + for heartburn Genitourinary: Negative  Musculoskeletal: Negative   Neurological: Negative for Hematological: Negative  Psychiatric/Behavioral: + for depression and anxiety   Objective:   Vitals:   03/08/16 0828  BP: 111/87  Pulse: 98  Resp: 16  Temp: 97.8 F (36.6 C)    Physical Exam: Constitutional: Patient appears well-developed and well-nourished. No distress. HENT: Normocephalic, atraumatic, External right and left ear normal. Oropharynx is clear and moist. Teeth in poor repair Eyes: Conjunctivae and EOM  are normal. PERRLA, no scleral icterus. Neck: Normal ROM. Neck supple. No lymphadenopathy, No thyromegaly. CVS: RRR, S1/S2 +, no murmurs, no gallops, no rubs Pulmonary: Effort and breath sounds normal, no stridor, rhonchi, wheezes, rales.  Abdominal: Soft. Normoactive BS,, no distension, tenderness, rebound or guarding.  Musculoskeletal: Normal range of motion. No edema and no tenderness.  Neuro: Alert.Normal muscle tone coordination. Non-focal Skin: Skin is warm and dry. No rash noted. Not diaphoretic. No erythema. No pallor. Psychiatric: Normal mood and affect. Behavior, judgment, thought content  normal.  Lab Results  Component Value Date   WBC 5.5 03/08/2016   HGB 16.9 03/08/2016   HCT 51.7 (H) 03/08/2016   MCV 93.8 03/08/2016   PLT 216 03/08/2016   Lab Results  Component Value Date   CREATININE 0.97 03/08/2016   BUN 13 03/08/2016   NA 139 03/08/2016   K 4.7 03/08/2016   CL 105 03/08/2016   CO2 23 03/08/2016    No results found for: HGBA1C Lipid Panel     Component Value Date/Time   CHOL 192 03/08/2016 0903   TRIG 119 03/08/2016 0903   HDL 54 03/08/2016 0903   CHOLHDL 3.6 03/08/2016 0903   VLDL 24 03/08/2016 0903   LDLCALC 114 03/08/2016 0903       Assessment and plan:   1. Healthcare maintenance  - Flu Vaccine QUAD 36+ mos PF IM (Fluarix & Fluzone Quad PF) - COMPLETE METABOLIC PANEL WITH GFR - CBC with Differential - Lipid panel - PSA - POC Hemoccult Bld/Stl (3-Cd Home Screen); Future  2. Generalized anxiety disorder  - busPIRone (BUSPAR) 5 MG tablet; Take 1 tablet (5 mg total) by mouth 2 (two) times daily.  Dispense: 60 tablet; Refill: 1  3. Depression, unspecified depression type  - busPIRone (BUSPAR) 5 MG tablet; Take 1 tablet (5 mg total) by mouth 2 (two) times daily.  Dispense: 60 tablet; Refill: 1  4. ETOH abuse  - folic acid (FOLVITE) 1 MG tablet; Take 1 tablet (1 mg total) by mouth daily.  Dispense: 30 tablet; Refill: 6   Return in about 6 months (around 09/06/2016).  The patient was given clear instructions to go to ER or return to medical center if symptoms don't improve, worsen or new problems develop. The patient verbalized understanding.    Henrietta HooverLinda C Bernhardt FNP  03/09/2016, 11:09 AM

## 2016-03-09 LAB — PSA: PSA: 0.1 ng/mL (ref <=4.0)

## 2016-03-26 MED FILL — busPIRone HCL 5 MG TABS: 5 | 30 days supply | Qty: 60 | Fill #0

## 2016-03-26 MED FILL — FOLIC ACID 1 MG TABLET: 1 | 30 days supply | Qty: 30 | Fill #0

## 2016-05-25 ENCOUNTER — Encounter: Payer: Self-pay | Admitting: Family Medicine

## 2016-05-25 ENCOUNTER — Ambulatory Visit (INDEPENDENT_AMBULATORY_CARE_PROVIDER_SITE_OTHER): Payer: Self-pay | Admitting: Family Medicine

## 2016-05-25 VITALS — BP 126/94 | HR 87 | Temp 97.6°F | Resp 16 | Ht 71.0 in | Wt 168.0 lb

## 2016-05-25 DIAGNOSIS — F411 Generalized anxiety disorder: Secondary | ICD-10-CM

## 2016-05-25 DIAGNOSIS — F32A Depression, unspecified: Secondary | ICD-10-CM

## 2016-05-25 DIAGNOSIS — R221 Localized swelling, mass and lump, neck: Secondary | ICD-10-CM

## 2016-05-25 DIAGNOSIS — G629 Polyneuropathy, unspecified: Secondary | ICD-10-CM

## 2016-05-25 DIAGNOSIS — R200 Anesthesia of skin: Secondary | ICD-10-CM

## 2016-05-25 DIAGNOSIS — F329 Major depressive disorder, single episode, unspecified: Secondary | ICD-10-CM

## 2016-05-25 DIAGNOSIS — R531 Weakness: Secondary | ICD-10-CM

## 2016-05-25 DIAGNOSIS — F1010 Alcohol abuse, uncomplicated: Secondary | ICD-10-CM

## 2016-05-25 LAB — TSH: TSH: 1.48 mIU/L (ref 0.40–4.50)

## 2016-05-25 MED ORDER — FLUOXETINE HCL 20 MG PO TABS: 20.0000 mg | ORAL_TABLET | Freq: Every day | ORAL | 3 refills | Status: DC

## 2016-05-25 MED ORDER — GABAPENTIN 300 MG PO CAPS: 300.0000 mg | ORAL_CAPSULE | Freq: Three times a day (TID) | ORAL | 2 refills | Status: DC

## 2016-05-25 MED ORDER — BUSPIRONE HCL 10 MG PO TABS
10.0000 mg | ORAL_TABLET | Freq: Two times a day (BID) | ORAL | 5 refills | Status: DC
Start: 2016-05-25 — End: 2016-08-23

## 2016-05-25 NOTE — Addendum Note (Signed)
Addended by: Massie MaroonHOLLIS, Kyandra Mcclaine M on: 05/25/2016 04:05 PM   Modules accepted: Orders

## 2016-05-25 NOTE — Patient Instructions (Addendum)
Please report to the emergency department if symptoms of depression worsen.  [facility] [address] [place], [place], [phone]. ADS Triage is M-F 9am-11:30 am.  [facility] [address]  [place], [place]  [place] in clinic M-F 8 am-3 pm  Chemical Dependency Chemical dependency is an addiction to drugs or alcohol. It is characterized by the repeated behavior of seeking out and using drugs and alcohol despite harmful consequences to the health and safety of ones self and others.  RISK FACTORS There are certain situations or behaviors that increase a person's risk for chemical dependency. These include:  A family history of chemical dependency.  A history of mental health issues, including depression and anxiety.  A home environment where drugs and alcohol are easily available to you.  Drug or alcohol use at a young age. SYMPTOMS  The following symptoms can indicate chemical dependency:  Inability to limit the use of drugs or alcohol.  Nausea, sweating, shakiness, and anxiety that occurs when alcohol or drugs are not being used.  An increase in amount of drugs or alcohol that is necessary to get drunk or high. People who experience these symptoms can assess their use of drugs and alcohol by asking themselves the following questions:  Have you been told by friends or family that they are worried about your use of alcohol or drugs?  Do friends and family ever tell you about things you did while drinking alcohol or using drugs that you do not remember?  Do you lie about using alcohol or drugs or about the amounts you use?  Do you have difficulty completing daily tasks unless you use alcohol or drugs?  Is the level of your work or school performance lower because of your drug or alcohol use?  Do you get sick from using drugs or alcohol but keep using anyway?  Do you feel uncomfortable in social situations unless you use alcohol or  drugs?  Do you use drugs or alcohol to help forget problems? An answer of yes to any of these questions may indicate chemical dependency. Professional evaluation is suggested. This information is not intended to replace advice given to you by your health care provider. Make sure you discuss any questions you have with your health care provider. Document Released: 05/01/2001 Document Revised: 07/30/2011 Document Reviewed: 01/12/2015 Elsevier Interactive Patient Education  2017 [employer] Inc[employer]  Persistent Depressive Disorder Persistent depressive disorder (PDD) is a mental health condition. PDD causes symptoms of low-level depression for 2 years or longer. It may also be called long-term (chronic) depression or dysthymia. PDD may include episodes of more severe depression that last for about 2 weeks (major depressive disorder or MDD). PDD can affect the way you think, feel, and sleep. This condition may also affect your relationships. You may be more likely to get sick if you have PDD. Symptoms of PDD occur for most of the day and may include:  Feeling tired (fatigue).  Low energy.  Eating too much or too little.  Sleeping too much or too little.  Feeling restless or agitated.  Feeling hopeless.  Feeling worthless or guilty.  Feeling worried or nervous (anxiety).  Trouble concentrating or making decisions.  Low self-esteem.  A negative way of looking at things (outlook).  Not being able to have fun or feel pleasure.  Avoiding interacting with people.  Getting angry or annoyed easily (irritability).  Acting aggressive or angry. Follow these instructions at home: Activity  Go back to  your normal activities as told by your doctor.  Exercise regularly as told by your doctor. General instructions  Take over-the-counter and prescription medicines only as told by your doctor.  Do not drink alcohol. Or, limit how much alcohol you drink to no more than 1 drink a day for  nonpregnant women and 2 drinks a day for men. One drink equals 12 oz of beer, 5 oz of wine, or 1 oz of hard liquor. Alcohol can affect any antidepressant medicines you are taking. Talk with your doctor about your alcohol use.  Eat a healthy diet and get plenty of sleep.  Find activities that you enjoy each day.  Consider joining a support group. Your doctor may be able to suggest a support group.  Keep all follow-up visits as told by your doctor. This is important. Where to find more information: [employer]ness  www.nami.org [employer]  http://www.maynard.net/www.nimh.nih.gov National Suicide Prevention Lifeline  769-585-58851-800-273-TALK 339-587-1787(1-(980)108-7844). This is free, 24-hour help. Contact a doctor if:  Your symptoms get worse.  You have new symptoms.  You have trouble sleeping or doing your daily activities. Get help right away if:  You self-harm.  You have serious thoughts about hurting yourself or others.  You see, hear, taste, smell, or feel things that are not there (hallucinate). This information is not intended to replace advice given to you by your health care provider. Make sure you discuss any questions you have with your health care provider. Document Released: 04/18/2015 Document Revised: 12/30/2015 Document Reviewed: 12/30/2015 Elsevier Interactive Patient Education  2017 [employer]

## 2016-05-25 NOTE — Progress Notes (Signed)
Subjective:    Patient ID: Leonard Ramos, male    DOB: [birth year], 61 y.o.   MRN: [id number]  HPI  Leonard Ramos, a 61 year old male presents  for a follow-up of ETOH abuse and neuropathy.  He has continued to have neuropathy and daily alcohol use.  Patient is currently complaining of left sided numbness and weakness.  He has had numbness and weakness for greater 1 year. Mr. Carey BullocksJarrell is also complaining of burning pain to left side. Patient states that he has weakness to upper and lower extremities, which is exacerbated by increased activity. He describes pain as burning, with pins and needle sensations to left upper and lower extremities. Patient denies fever, headache, rash, slurred speech, memory loss, or IV drug use.  Leonard Ramos reports everyday alcohol use. He states that he drinks one-two 40 ounce beers per day. He states that he has been drinking every day for many years. He feels guilty about his drinking. He also requires an early morning "eye opener" several times per week. His family has been trying to encourage him to quit. He has not attempted any treatment programs in the past. He states that alcohol intake has increased since being out of work.   Patient is also complaining of anxiety and depression  He has the following symptoms: difficulty concentrating, fatigue, feelings of losing control, racing thoughts, anhedonia. Onset of symptoms was approximately several months ago, and clinical course has worsened.  He endorses current suicidal and homicidal ideation. He does not have a plan  Patient also smoke 1 pack of cigarettes per day. He reports that he has tried to quit. He is not interested in smoking cessation at this time.  Past Medical History:  Diagnosis Date  . Alcohol use   . Neuropathy (HCC)    Immunization History  Administered Date(s) Administered  . Influenza,inj,Quad PF,36+ Mos 03/08/2015, 03/08/2016  . Pneumococcal Polysaccharide-23 03/08/2015  . Tdap  03/08/2016   Social History   Social History  . Marital status: Single    Spouse name: N/A  . Number of children: N/A  . Years of education: N/A   Occupational History  . Not on file.   Social History Main Topics  . Smoking status: Current Every Day Smoker    Packs/day: 0.50    Types: Cigarettes  . Smokeless tobacco: Never Used  . Alcohol use 24.0 oz/week    40 Standard drinks or equivalent per week     Comment: 1-2 dsily  . Drug use: No  . Sexual activity: Yes    Partners: Female   Other Topics Concern  . Not on file   Social History Narrative  . No narrative on file   Review of Systems  Constitutional: Negative for fatigue.  HENT: Negative.   Eyes: Negative.  Negative for photophobia, pain and redness.  Respiratory: Negative.  Negative for shortness of breath.   Cardiovascular: Negative for chest pain, palpitations and leg swelling.  Gastrointestinal: Negative.   Endocrine: Negative.  Negative for cold intolerance, heat intolerance, polydipsia, polyphagia and polyuria.  Genitourinary: Negative.   Musculoskeletal: Positive for gait problem.  Skin: Negative.   Allergic/Immunologic: Negative.  Negative for immunocompromised state.  Neurological: Positive for dizziness, tremors, weakness and numbness. Negative for seizures, syncope, facial asymmetry, speech difficulty and headaches.  Hematological: Negative.   Psychiatric/Behavioral: Positive for decreased concentration and depression. Negative for sleep disturbance. The patient is nervous/anxious.        Objective:   Physical Exam  Constitutional: He appears well-developed.  Non-toxic appearance. He does not have a sickly appearance. No distress.  HENT:  Head: Normocephalic and atraumatic.  Right Ear: Hearing, tympanic membrane, external ear and ear canal normal.  Left Ear: Hearing, tympanic membrane, external ear and ear canal normal.  Mouth/Throat: Uvula is midline and oropharynx is clear and moist. Abnormal  dentition. Dental caries (partially edentulous) present.  Eyes: Conjunctivae, EOM and lids are normal. Pupils are equal, round, and reactive to light.  Neck: Trachea normal, normal range of motion and full passive range of motion without pain. Neck supple. Thyromegaly present.  Cardiovascular: Normal rate, regular rhythm and normal pulses.   Pulmonary/Chest: Effort normal and breath sounds normal.  Abdominal: Soft. Normal appearance. There is tenderness in the right upper quadrant.  Musculoskeletal:       Left shoulder: He exhibits decreased strength. He exhibits no spasm and normal pulse.       Left elbow: He exhibits no swelling.       Left wrist: He exhibits decreased range of motion. He exhibits no swelling.       Left knee: He exhibits no swelling and no erythema.  Lymphadenopathy:       Head (right side): No submental and no submandibular adenopathy present.       Head (left side): No submental and no submandibular adenopathy present.  Neurological: He is alert. He is not disoriented. He displays tremor (Bilateral resting tremor). No cranial nerve deficit or sensory deficit. He exhibits abnormal muscle tone (upper extremities). Coordination abnormal.  Skin: Skin is warm, dry and intact.  Psychiatric: His mood appears anxious. He exhibits a depressed mood (tearful).      BP (!) 126/94 (BP Location: Right Arm, Patient Position: Sitting, Cuff Size: Normal)   Pulse 87   Temp 97.6 F (36.4 C) (Oral)   Resp 16   Ht 5\' 11"  (1.803 m)   Wt 168 lb (76.2 kg)   SpO2 97%   BMI 23.43 kg/m  Assessment & Plan:  1. ETOH abuse Mr. Stoffers continues to drink alcohol daily. Patient is not interested in Alcohol and Drug Services at this time. Patient given information. [address] [place], [place], [phone]. ADS Triage is M-F 9am-11:30 am.  - Ethanol  2. Neuropathy [facility]) Mr. Flight reports that he has been experiencing  numbness for greater than 1 year. He was recently evaluated  in the emergency department on 02/16/2015. Reviewed CT of head, no acute intercranial hemorrhage noted. However, a lesion was visualized, patient has some new neurological findings including numbness, weakness, imbalance and incontinence.  I will continue gabapentin. Patient was previously referred to neurology for further evaluation, he as been unable to follow due to lack of payer source. He is in the process of applying for Medicaid.   - gabapentin (NEURONTIN) 300 MG capsule; Take 1 capsule (300 mg total) by mouth 3 (three) times daily.  Dispense: 90 capsule; Refill: 2  3. Generalized anxiety disorder I will increase Buspar 10 mg and add Prozac 20 mg to medication regimen.  - busPIRone (BUSPAR) 10 MG tablet; Take 1 tablet (10 mg total) by mouth 2 (two) times daily.  Dispense: 60 tablet; Refill: 5 - FLUoxetine (PROZAC) 20 MG tablet; Take 1 tablet (20 mg total) by mouth daily.  Dispense: 30 tablet; Refill: 3 GAD 7 : Generalized Anxiety Score 10/07/2015  Nervous, Anxious, on Edge 3  Control/stop worrying 2  Worry too much - different things 2  Trouble relaxing 2  Restless  2  Easily annoyed or irritable 2  Afraid - awful might happen 2  Total GAD 7 Score 15  Anxiety Difficulty Somewhat difficult    4. Depression, unspecified depression type - busPIRone (BUSPAR) 10 MG tablet; Take 1 tablet (10 mg total) by mouth 2 (two) times daily.  Dispense: 60 tablet; Refill: 5 - FLUoxetine (PROZAC) 20 MG tablet; Take 1 tablet (20 mg total) by mouth daily.  Dispense: 30 tablet; Refill: 3 Depression screen [facility] 2/9 05/25/2016 03/08/2016 12/07/2015 10/06/2015 10/06/2015  Decreased Interest 3 1 1  0 2  Down, Depressed, Hopeless 3 0 0 0 2  PHQ - 2 Score 6 1 1  0 4  Altered sleeping 3 - - - 1  Tired, decreased energy 3 - - - 1  Change in appetite 3 - - - 2  Feeling bad or failure about yourself  3 - - - 2  Trouble concentrating 3 - - - 1  Moving slowly or fidgety/restless 3 - - - -  Suicidal thoughts 3 - - - -   PHQ-9 Score 27 - - - 11  Difficult doing work/chores Very difficult - - - Somewhat difficult   5. Left sided numbness - gabapentin (NEURONTIN) 300 MG capsule; Take 1 capsule (300 mg total) by mouth 3 (three) times daily.  Dispense: 90 capsule; Refill: 2  6. Left-sided weakness - gabapentin (NEURONTIN) 300 MG capsule; Take 1 capsule (300 mg total) by mouth 3 (three) times daily.  Dispense: 90 capsule; Refill: 2  7. Neck fullness - TSH  RTC: 3 months for neuropathy, ETOH abuse, and depression   Trilby Way M, FNP

## 2016-05-28 LAB — ETHANOL, CLINICAL
ALCOHOL, ETHYL (B): NOT DETECTED mg/dL
Alcohol, Ethyl (B): NOT DETECTED g/dL(%)

## 2016-06-08 ENCOUNTER — Ambulatory Visit: Payer: Self-pay | Admitting: Family Medicine

## 2016-08-23 ENCOUNTER — Ambulatory Visit (INDEPENDENT_AMBULATORY_CARE_PROVIDER_SITE_OTHER): Payer: Self-pay | Admitting: Family Medicine

## 2016-08-23 ENCOUNTER — Encounter: Payer: Self-pay | Admitting: Family Medicine

## 2016-08-23 VITALS — BP 131/82 | HR 106 | Temp 97.7°F | Resp 16 | Ht 71.0 in | Wt 165.0 lb

## 2016-08-23 DIAGNOSIS — F329 Major depressive disorder, single episode, unspecified: Secondary | ICD-10-CM

## 2016-08-23 DIAGNOSIS — G629 Polyneuropathy, unspecified: Secondary | ICD-10-CM

## 2016-08-23 DIAGNOSIS — R531 Weakness: Secondary | ICD-10-CM

## 2016-08-23 DIAGNOSIS — F1010 Alcohol abuse, uncomplicated: Secondary | ICD-10-CM

## 2016-08-23 DIAGNOSIS — R200 Anesthesia of skin: Secondary | ICD-10-CM

## 2016-08-23 DIAGNOSIS — F32A Depression, unspecified: Secondary | ICD-10-CM

## 2016-08-23 DIAGNOSIS — F411 Generalized anxiety disorder: Secondary | ICD-10-CM

## 2016-08-23 DIAGNOSIS — R45851 Suicidal ideations: Secondary | ICD-10-CM

## 2016-08-23 MED ORDER — BUSPIRONE HCL 10 MG PO TABS: 10.0000 mg | ORAL_TABLET | Freq: Two times a day (BID) | ORAL | 5 refills | Status: DC

## 2016-08-23 MED ORDER — GABAPENTIN 300 MG PO CAPS: 300.0000 mg | ORAL_CAPSULE | Freq: Three times a day (TID) | ORAL | 3 refills | Status: DC

## 2016-08-23 MED ORDER — FLUOXETINE HCL 20 MG PO TABS: 20.0000 mg | ORAL_TABLET | Freq: Every day | ORAL | 5 refills | Status: DC

## 2016-08-23 NOTE — Progress Notes (Signed)
Subjective:    Patient ID: Leonard Ramos, male    DOB: 05-May-1956, 61 y.o.   MRN: [id number]  HPI  Leonard Ramos, a 61 year old male presents accompanied by girlfriend  for a follow-up of ETOH abuse and neuropathy.  He has continued to have neuropathy and daily alcohol use.  Patient is currently complaining of left sided numbness and weakness.  He has had numbness and weakness for greater 1 year. Leonard Ramos is also complaining of burning pain to left side. Patient states that he has weakness to upper and lower extremities, which is exacerbated by increased activity. He describes pain as burning, with pins and needle sensations to left upper and lower extremities. Patient denies fever, headache, rash, slurred speech, memory loss, or IV drug use.   Leonard Ramos reports everyday alcohol use. He states that he drinks two 40 ounce beers per day. He states that he has been drinking every day for many years. He feels guilty about his drinking. He also requires an early morning "eye opener" several times per week. His family has been trying to encourage him to quit. He has not attempted any treatment programs in the past. He states that alcohol intake has increased since being out of work.   Patient is also complaining of anxiety and depression  He has the following symptoms: difficulty concentrating, fatigue, feelings of losing control, racing thoughts, anhedonia. Onset of symptoms was approximately several months ago, and clinical course has worsened.  He endorses current suicidal and homicidal ideation. He does not have a plan today. He is requesting help for suicidal and homicidal evaluation. He has been taking Buspar and Paxil consistently without relief.   Patient also smoke 1 pack of cigarettes per day. He reports that he has tried to quit. He is not interested in smoking cessation at this time.  Past Medical History:  Diagnosis Date  . Alcohol use   . Neuropathy (HCC)    Immunization  History  Administered Date(s) Administered  . Influenza,inj,Quad PF,36+ Mos 03/08/2015, 03/08/2016  . Pneumococcal Polysaccharide-23 03/08/2015  . Tdap 03/08/2016   Social History   Social History  . Marital status: Single    Spouse name: N/A  . Number of children: N/A  . Years of education: N/A   Occupational History  . Not on file.   Social History Main Topics  . Smoking status: Current Every Day Smoker    Packs/day: 0.50    Types: Cigarettes  . Smokeless tobacco: Never Used  . Alcohol use 24.0 oz/week    40 Standard drinks or equivalent per week     Comment: 1-2 dsily  . Drug use: No  . Sexual activity: Yes    Partners: Female   Other Topics Concern  . Not on file   Social History Narrative  . No narrative on file   Review of Systems  Constitutional: Negative for fatigue.  HENT: Negative.   Eyes: Negative.  Negative for photophobia, pain and redness.  Respiratory: Negative.  Negative for shortness of breath.   Cardiovascular: Negative for chest pain, palpitations and leg swelling.  Gastrointestinal: Negative.   Endocrine: Negative.  Negative for cold intolerance, heat intolerance, polydipsia, polyphagia and polyuria.  Genitourinary: Negative.   Musculoskeletal: Positive for myalgias.  Skin: Negative.   Allergic/Immunologic: Negative.  Negative for immunocompromised state.  Neurological: Positive for dizziness, tremors, weakness and numbness. Negative for seizures, syncope, facial asymmetry, speech difficulty and headaches.  Hematological: Negative.   Psychiatric/Behavioral: Positive for decreased  concentration. Negative for sleep disturbance. The patient is nervous/anxious.        Worsening depression       Objective:   Physical Exam  Constitutional: He appears well-developed.  Non-toxic appearance. He does not have a sickly appearance. No distress.  HENT:  Head: Normocephalic and atraumatic.  Right Ear: Hearing, tympanic membrane, external ear and ear  canal normal.  Left Ear: Hearing, tympanic membrane, external ear and ear canal normal.  Mouth/Throat: Uvula is midline and oropharynx is clear and moist. Abnormal dentition. Dental caries (partially edentulous) present.  Eyes: Conjunctivae, EOM and lids are normal. Pupils are equal, round, and reactive to light.  Neck: Trachea normal, normal range of motion and full passive range of motion without pain. Neck supple. Thyromegaly present.  Cardiovascular: Normal rate, regular rhythm and normal pulses.   Pulmonary/Chest: Effort normal and breath sounds normal.  Abdominal: Soft. Normal appearance. There is tenderness in the right upper quadrant.  Musculoskeletal:       Left shoulder: He exhibits decreased strength. He exhibits no spasm and normal pulse.       Left elbow: He exhibits no swelling.       Left wrist: He exhibits decreased range of motion. He exhibits no swelling.       Left knee: He exhibits no swelling and no erythema.  Lymphadenopathy:       Head (right side): No submental and no submandibular adenopathy present.       Head (left side): No submental and no submandibular adenopathy present.  Neurological: He is alert. He is not disoriented. He displays tremor (Bilateral resting tremor). No cranial nerve deficit or sensory deficit. He exhibits abnormal muscle tone (upper extremities). Coordination abnormal.  Skin: Skin is warm, dry and intact.  Psychiatric: His mood appears anxious. He exhibits a depressed mood (tearful).      BP 131/82 (BP Location: Left Arm, Patient Position: Sitting, Cuff Size: Large)   Pulse (!) 106   Temp 97.7 F (36.5 C) (Oral)   Resp 16   Ht  (1.803 m)   Wt 165 lb (74.8 kg)   SpO2 98%   BMI 23.01 kg/m  Assessment & Plan:  1. ETOH abuse ETOH abuse Called security for an escort to [facility] for a psych assessment.   Leonard Ramos continues to drink alcohol daily. His last alcoholic beverage was around 8am.Patient is not interested in  Alcohol and Drug Services at this time. Patient given information. [address] [place], [place], [phone]. ADS Triage is M-F 9am-11:30 am.    2. Verbalizes suicidal thoughts Notified behavorial health and [facility] Emergency department. Called Security to give patient an escort to [facility]. Patient refused to go after security arrived. He denied a plan and left office accompanied by girlfriend.   3. Neuropathy [facility]) Leonard Ramos reports that he has been experiencing  numbness for greater than 1 year.    I will continue gabapentin. Patient was previously referred to neurology for further evaluation, he as been unable to follow due to lack of payer source.   - gabapentin (NEURONTIN) 300 MG capsule; Take 1 capsule (300 mg total) by mouth 3 (three) times daily.  Dispense: 90 capsule; Refill: 3  4. Left sided numbness - gabapentin (NEURONTIN) 300 MG capsule; Take 1 capsule (300 mg total) by mouth 3 (three) times daily.  Dispense: 90 capsule; Refill: 3  5. Left-sided weakness - gabapentin (NEURONTIN) 300 MG capsule; Take 1 capsule (300 mg total) by mouth  3 (three) times daily.  Dispense: 90 capsule; Refill: 3  6. Generalized anxiety disorder GAD 7 : Generalized Anxiety Score 08/23/2016 10/07/2015  Nervous, Anxious, on Edge 3 3  Control/stop worrying 3 2  Worry too much - different things 3 2  Trouble relaxing 2 2  Restless 2 2  Easily annoyed or irritable 2 2  Afraid - awful might happen 2 2  Total GAD 7 Score 17 15  Anxiety Difficulty Not difficult at all Somewhat difficult    - FLUoxetine (PROZAC) 20 MG tablet; Take 1 tablet (20 mg total) by mouth daily.  Dispense: 30 tablet; Refill: 5 - busPIRone (BUSPAR) 10 MG tablet; Take 1 tablet (10 mg total) by mouth 2 (two) times daily.  Dispense: 60 tablet; Refill: 5  7. Depression, unspecified depression type - FLUoxetine (PROZAC) 20 MG tablet; Take 1 tablet (20 mg total) by mouth daily.  Dispense: 30 tablet; Refill:  5 - busPIRone (BUSPAR) 10 MG tablet; Take 1 tablet (10 mg total) by mouth 2 (two) times daily.  Dispense: 60 tablet; Refill: 5  Depression screen [facility] 2/9 08/23/2016 08/23/2016 08/23/2016 05/25/2016 03/08/2016  Decreased Interest Down, Depressed, Hopeless 2 - 3 3 0  PHQ - 2 Score Altered sleeping 3 - 3 3 -  Tired, decreased energy 3 - 3 3 -  Change in appetite 0 - 3 3 -  Feeling bad or failure about yourself  3 - 3 3 -  Trouble concentrating 3 - 0 3 -  Moving slowly or fidgety/restless 0 - 3 3 -  Suicidal thoughts 3 - 3 3 -  PHQ-9 Score 20 - 24 27 -  Difficult doing work/chores - - Very difficult Very difficult -     RTC: RECOMMEND THAT PATIENT FOLLOW UP IN BEHAVORIAL HEALTH AS DISCUSSED.     Nolon Nations  MSN, FNP-C [facility] [address] [place], [place] [address] [phone]

## 2016-08-23 NOTE — Patient Instructions (Addendum)
Please report to the emergency department if symptoms of depression worsen.  [facility] [address] [place], [place], [phone]. ADS Triage is M-F 9am-11:30 am.  [facility] [address]  [place], [place]  Walk in clinic M-F 8 am-3 pm   Patient to [facility] for psychological evaluation and alcohol detox. Patient changed his mind. He says that he is not having suicidal thoughts.    Helping Someone Who is Suicidal Suicide is when someone takes his or her own life. Someone who is thinking about suicide needs immediate help. Although you might not know what to say or do to help, start by letting that person know you care. Listen to him or her. Then talk about how to get help. Help is available through therapy, medicine, and other treatments. What are signs that someone is suicidal? Common signs include:  Signs of depression, such as:  Rage.  Irritability.  Shame.  Excessive worry.  Loss of interest in things the person once enjoyed.  Changes in social behaviors and relationships, including:  Isolating oneself.  Withdrawing from friends and family.  Giving away possessions.  Saying good-bye.  Acting aggressively.  Sleeping more or less than usual.  Having trouble managing school or work.  Talking about feeling hopeless or being a burden.  Engaging in risky behaviors, such as drinking more alcohol or using more drugs. What are the risk factors for suicide? Risk factors for suicide include:  Other suicides in the family.  A history of suicide attempts.  Depression or other mental health issues.  Being in jail or facing jail time.  Having had close friends who have committed suicide.  Alcohol or drug abuse, especially combined with a mental illness. What should I do if someone is suicidal? If you believe a person is in immediate danger of committing suicide, call your local emergency services (911 in  the [place] for help. If a person says he or she wants to commit suicide, take the threat seriously. Help the person get help right away by:  Calling your local emergency services.  Calling a suicide prevention hotline.  Contacting a crisis center or a local suicide prevention center. These are often located at hospitals, clinics, [employer], social service providers, or health departments. If a person confides in you that he or she is considering suicide:  Listen to the person's thoughts and concerns with compassion.  Let the person know you will stay with him or her.  Ask if the person is having thoughts of hurting himself or herself.  Offer to help the person get to a doctor or mental health professional.  Remove all weapons and medicines from the person's living space.  Do not promise to keep his or her thoughts of suicide a secret. This information is not intended to replace advice given to you by your health care provider. Make sure you discuss any questions you have with your health care provider. Document Released: 11/11/2002 Document Revised: 10/13/2015 Document Reviewed: 10/15/2013 Elsevier Interactive Patient Education  2017 Elsevier Inc.  Borderline Personality Disorder Borderline personality disorder (BPD) is a mental health disorder. People with this condition:  Have unstable moods and relationships.  Have trouble controlling emotions.  Often engage in impulsive or reckless behavior.  Often fear being abandoned by friends or family. People with BPD often have other mental health issues, such as depression, anxiety disorder, substance abuse disorder, or an eating disorder. People with the condition may develop suicidal thoughts  or behaviors. CAUSES The exact cause of this condition is not known. Possible contributing factors include:  Genetic factors. These are traits that are passed down from one generation to the next. Many people with BPD  have a family history of the disorder.  Physical factors. The part of the brain that controls emotion may be different in people who have this condition.  Social factors. Traumatic experiences involving other people may play a role in the development of BPD. This may include child abuse or neglect. RISK FACTORS This condition may be more likely to develop in people who:  Have a parent or close relative with the condition.  Experienced physical or sexual abuse as a child.  Experienced neglect or were separated from their parents as a child.  Have unstable family relationships. SYMPTOMS Symptoms of BPD usually start during the teen years or in early adulthood. Symptoms of this condition include:  Extreme overreactions to the possibility of being abandoned by family or friends. This may include explosive responses to seemingly minor events, such as a change of plans.  Volatile relationships with friends and relatives. This may include extreme swings from feelings of love to intense anger.  Distorted or unstable self-image, which can affect mood, relationships, and future goals or plans.  Reckless or impulsive behaviors, such as shopping sprees, risky sexual behavior, substance abuse, or overeating.  Destructive behaviors, such as cutting or other forms of self-harm or thoughts of suicide.  Extreme mood swings that can last for hours or days.  Constant boredom.  Problems controlling anger. This can result in shame or guilt.  Paranoid thoughts.  Feeling disassociated or cut off from oneself.  Losing touch with reality. DIAGNOSIS A diagnosis of BPD requires the presence of at least five common symptoms. Information about the person's symptoms is gathered from family, friends, and medical and legal professionals. A health care provider who specializes in physical and psychological causes of mental conditions (psychiatrist) will do a psychiatric evaluation. TREATMENT This condition  is usually treated by mental health professionals, such as psychologists, psychiatrists, and clinical social workers. More than one type of treatment may be needed. Treatment may include therapy such as:  Psychotherapy. This may also be called talk therapy or counseling.  Cognitive behavioral therapy. This helps the person to recognize and change unhealthy feelings, thoughts, and behaviors. It helps him or her find new, more positive thoughts and actions to replace the old ones.  Dialectical behavioral therapy. Through this type of treatment, a person learns to understand his or her feelings and to regulate them. This may be one-on-one treatment or part of group therapy.  Schema-focused therapy. This form of therapy helps a person with a distorted self-image to see him or herself differently. This may be one-on-one treatment or part of group therapy.  Family therapy. This treatment includes family members. Medicine may be used to help control emotions and behavior and to treat anxiety and depression. In some cases, ahospital stay may be necessary to protect those engaging in dangerous behaviors or those with suicidal thoughts or behaviors. HOME CARE INSTRUCTIONS People with BPD should:  Take over-the-counter and prescription medicines only as told by their health care provider. Medicines can affect different people in different ways. People with BPD should talk to their health care provider about the possible side effects of their medicines.  Maintain a daily routine. The routine should include set times to eat and sleep.  Do regular physical activity to reduce stress. They should ask a  health care provider to recommend activities.  Communicate with close friends and relatives about what triggers symptoms.  Find comforting people and environments.  Keep all follow-up visits as told by their health care provider. This is important. SEEK MEDICAL CARE IF:  Symptoms do not improve or get  worse.  New symptoms develop. SEEK IMMEDIATE MEDICAL CARE IF:  Serious thoughts occur about self-harm or about hurting others. FOR MORE INFORMATION  National Alliance on Mental Illness: www.nami.org  [employer] of Mental Health: http://www.maynard.net/  Borderline Personality Resource Center: http://bpdresourcecenter.org This information is not intended to replace advice given to you by your health care provider. Make sure you discuss any questions you have with your health care provider. Document Released: 08/22/2010 Document Revised: 08/29/2015 Document Reviewed: 02/28/2015 Elsevier Interactive Patient Education  2017 Elsevier Inc.  Alcohol Use Disorder Alcohol use disorder is when your drinking disrupts your daily life. When you have this condition, you drink too much alcohol and you cannot control your drinking. Alcohol use disorder can cause serious problems with your physical health. It can affect your brain, heart, liver, pancreas, immune system, stomach, and intestines. Alcohol use disorder can increase your risk for certain cancers and cause problems with your mental health, such as depression, anxiety, psychosis, delirium, and dementia. People with this disorder risk hurting themselves and others. What are the causes? This condition is caused by drinking too much alcohol over time. It is not caused by drinking too much alcohol only one or two times. Some people with this condition drink alcohol to cope with or escape from negative life events. Others drink to relieve pain or symptoms of mental illness. What increases the risk? You are more likely to develop this condition if:  You have a family history of alcohol use disorder.  Your culture encourages drinking to the point of intoxication, or makes alcohol easy to get.  You had a mood or conduct disorder in childhood.  You have been a victim of abuse.  You are an adolescent and:  You have poor grades or  difficulties in school.  Your caregivers do not talk to you about saying no to alcohol, or supervise your activities.  You are impulsive or you have trouble with self-control. What are the signs or symptoms? Symptoms of this condition include:  Drinkingmore than you want to.  Drinking for longer than you want to.  Trying several times to drink less or to control your drinking.  Spending a lot of time getting alcohol, drinking, or recovering from drinking.  Craving alcohol.  Having problems at work, at school, or at home due to drinking.  Having problems in relationships due to drinking.  Drinking when it is dangerous to drink, such as before driving a car.  Continuing to drink even though you know you might have a physical or mental problem related to drinking.  Needing more and more alcohol to get the same effect you want from the alcohol (building up tolerance).  Having symptoms of withdrawal when you stop drinking. Symptoms of withdrawal include:  Fatigue.  Nightmares.  Trouble sleeping.  Depression.  Anxiety.  Fever.  Seizures.  Severe confusion.  Feeling or seeing things that are not there (hallucinations).  Tremors.  Rapid heart rate.  Rapid breathing.  High blood pressure.  Drinking to avoid symptoms of withdrawal. How is this diagnosed? This condition is diagnosed with an assessment. Your health care provider may start the assessment by asking three or four questions about your drinking. Your  health care provider may perform a physical exam or do lab tests to see if you have physical problems resulting from alcohol use. She or he may refer you to a mental health professional for evaluation. How is this treated? Some people with alcohol use disorder are able to reduce their alcohol use to low-risk levels. Others need to completely quit drinking alcohol. When necessary, mental health professionals with specialized training in substance use treatment  can help. Your health care provider can help you decide how severe your alcohol use disorder is and what type of treatment you need. The following forms of treatment are available:  Detoxification. Detoxification involves quitting drinking and using prescription medicines within the first week to help lessen withdrawal symptoms. This treatment is important for people who have had withdrawal symptoms before and for heavy drinkers who are likely to have withdrawal symptoms. Alcohol withdrawal can be dangerous, and in severe cases, it can cause death. Detoxification may be provided in a home, community, or primary care setting, or in a hospital or substance use treatment facility.  Counseling. This treatment is also called talk therapy. It is provided by substance use treatment counselors. A counselor can address the reasons you use alcohol and suggest ways to keep you from drinking again or to prevent problem drinking. The goals of talk therapy are to:  Find healthy activities and ways for you to cope with stress.  Identify and avoid the things that trigger your alcohol use.  Help you learn how to handle cravings.  Medicines.Medicines can help treat alcohol use disorder by:  Decreasing alcohol cravings.  Decreasing the positive feeling you have when you drink alcohol.  Causing an uncomfortable physical reaction when you drink alcohol (aversion therapy).  Support groups. Support groups are led by people who have quit drinking. They provide emotional support, advice, and guidance. These forms of treatment are often combined. Some people with this condition benefit from a combination of treatments provided by specialized substance use treatment centers. Follow these instructions at home:  Take over-the-counter and prescription medicines only as told by your health care provider.  Check with your health care provider before starting any new medicines.  Ask friends and family members not to  offer you alcohol.  Avoid situations where alcohol is served, including gatherings where others are drinking alcohol.  Create a plan for what to do when you are tempted to use alcohol.  Find hobbies or activities that you enjoy that do not include alcohol.  Keep all follow-up visits as told by your health care provider. This is important. How is this prevented?  If you drink, limit alcohol intake to no more than 1 drink a day for nonpregnant women and 2 drinks a day for men. One drink equals 12 oz of beer, 5 oz of wine, or 1 oz of hard liquor.  If you have a mental health condition, get treatment and support.  Do not give alcohol to adolescents.  If you are an adolescent:  Do not drink alcohol.  Do not be afraid to say no if someone offers you alcohol. Speak up about why you do not want to drink. You can be a positive role model for your friends and set a good example for those around you by not drinking alcohol.  If your friends drink, spend time with others who do not drink alcohol. Make new friends who do not use alcohol.  Find healthy ways to manage stress and emotions, such as meditation or deep  breathing, exercise, spending time in nature, listening to music, or talking with a trusted friend or family member. Contact a health care provider if:  You are not able to take your medicines as told.  Your symptoms get worse.  You return to drinking alcohol (relapse) and your symptoms get worse. Get help right away if:  You have thoughts about hurting yourself or others. If you ever feel like you may hurt yourself or others, or have thoughts about taking your own life, get help right away. You can go to your nearest emergency department or call:  Your local emergency services (911 in the [place]  A suicide crisis helpline, such as the National Suicide Prevention Lifeline at 418-227-6844. This is open 24 hours a day. Summary  Alcohol use disorder is when your drinking  disrupts your daily life. When you have this condition, you drink too much alcohol and you cannot control your drinking.  Treatment may include detoxification, counseling, medicine, and support groups.  Ask friends and family members not to offer you alcohol. Avoid situations where alcohol is served.  Get help right away if you have thoughts about hurting yourself or others. This information is not intended to replace advice given to you by your health care provider. Make sure you discuss any questions you have with your health care provider. Document Released: 06/14/2004 Document Revised: 02/02/2016 Document Reviewed: 02/02/2016 Elsevier Interactive Patient Education  2017 [employer].

## 2016-11-23 ENCOUNTER — Ambulatory Visit: Payer: Self-pay | Admitting: Family Medicine

## 2016-11-28 ENCOUNTER — Ambulatory Visit: Payer: Self-pay | Admitting: Family Medicine

## 2016-12-03 ENCOUNTER — Ambulatory Visit (INDEPENDENT_AMBULATORY_CARE_PROVIDER_SITE_OTHER): Payer: Self-pay | Admitting: Family Medicine

## 2016-12-03 ENCOUNTER — Encounter: Payer: Self-pay | Admitting: Family Medicine

## 2016-12-03 VITALS — BP 128/86 | HR 88 | Temp 98.0°F | Ht 71.0 in | Wt 166.0 lb

## 2016-12-03 DIAGNOSIS — F329 Major depressive disorder, single episode, unspecified: Secondary | ICD-10-CM

## 2016-12-03 DIAGNOSIS — F32A Depression, unspecified: Secondary | ICD-10-CM

## 2016-12-03 DIAGNOSIS — R200 Anesthesia of skin: Secondary | ICD-10-CM

## 2016-12-03 DIAGNOSIS — F411 Generalized anxiety disorder: Secondary | ICD-10-CM

## 2016-12-03 DIAGNOSIS — F1010 Alcohol abuse, uncomplicated: Secondary | ICD-10-CM

## 2016-12-03 DIAGNOSIS — F17200 Nicotine dependence, unspecified, uncomplicated: Secondary | ICD-10-CM

## 2016-12-03 DIAGNOSIS — G629 Polyneuropathy, unspecified: Secondary | ICD-10-CM

## 2016-12-03 MED ORDER — BUSPIRONE HCL 10 MG PO TABS: 10.0000 mg | ORAL_TABLET | Freq: Two times a day (BID) | ORAL | 5 refills | Status: DC

## 2016-12-03 MED ORDER — GABAPENTIN 400 MG PO CAPS: 400.0000 mg | ORAL_CAPSULE | Freq: Three times a day (TID) | ORAL | 5 refills | Status: DC

## 2016-12-03 MED ORDER — FLUOXETINE HCL 20 MG PO TABS: 20.0000 mg | ORAL_TABLET | Freq: Every day | ORAL | 5 refills | Status: DC

## 2016-12-03 MED FILL — GABAPENTIN 400 MG CAP: 400 | 30 days supply | Qty: 90 | Fill #0

## 2016-12-03 MED FILL — busPIRone HCL 10 MG TABS: 10 | 30 days supply | Qty: 60 | Fill #0

## 2016-12-03 MED FILL — FLUoxetine HCL 20 MG CAPS: 20 | 30 days supply | Qty: 30 | Fill #0

## 2016-12-03 NOTE — Patient Instructions (Addendum)
Depression:  [facility] [address] [place], [place] Walk-in clinic M-F 8am- 3pm   Neuropathy:  I will increase Gabapentin 400 mg three times per day.   Alcohol abuse:   [facility] [phone] [address] [place], [place]  Persistent Depressive Disorder Persistent depressive disorder (PDD) is a mental health condition. PDD causes symptoms of low-level depression for 2 years or longer. It may also be called long-term (chronic) depression or dysthymia. PDD may include episodes of more severe depression that last for about 2 weeks (major depressive disorder or MDD). PDD can affect the way you think, feel, and sleep. This condition may also affect your relationships. You may be more likely to get sick if you have PDD. Symptoms of PDD occur for most of the day and may include:  Feeling tired (fatigue).  Low energy.  Eating too much or too little.  Sleeping too much or too little.  Feeling restless or agitated.  Feeling hopeless.  Feeling worthless or guilty.  Feeling worried or nervous (anxiety).  Trouble concentrating or making decisions.  Low self-esteem.  A negative way of looking at things (outlook).  Not being able to have fun or feel pleasure.  Avoiding interacting with people.  Getting angry or annoyed easily (irritability).  Acting aggressive or angry.  Follow these instructions at home: Activity  Go back to your normal activities as told by your doctor.  Exercise regularly as told by your doctor. General instructions  Take over-the-counter and prescription medicines only as told by your doctor.  Do not drink alcohol. Or, limit how much alcohol you drink to no more than 1 drink a day for nonpregnant women and 2 drinks a day for men. One drink equals 12 oz of beer, 5 oz of wine, or 1 oz of hard liquor. Alcohol can affect any antidepressant medicines you are taking. Talk with your doctor about your  alcohol use.  Eat a healthy diet and get plenty of sleep.  Find activities that you enjoy each day.  Consider joining a support group. Your doctor may be able to suggest a support group.  Keep all follow-up visits as told by your doctor. This is important. Where to find more information: The [employer] Alliance on Mental Illness  www.nami.org  [place] of Mental Health  http://www.maynard.net/www.nimh.nih.gov  National Suicide Prevention Lifeline  832-803-10751-800-273-TALK 647-437-5953(1-725-734-7090). This is free, 24-hour help.  Contact a doctor if:  Your symptoms get worse.  You have new symptoms.  You have trouble sleeping or doing your daily activities. Get help right away if:  You self-harm.  You have serious thoughts about hurting yourself or others.  You see, hear, taste, smell, or feel things that are not there (hallucinate). This information is not intended to replace advice given to you by your health care provider. Make sure you discuss any questions you have with your health care provider. Document Released: 04/18/2015 Document Revised: 12/30/2015 Document Reviewed: 12/30/2015 Elsevier Interactive Patient Education  2017 Elsevier Inc.    Peripheral Neuropathy Peripheral neuropathy is a type of nerve damage. It affects nerves that carry signals between the spinal cord and other parts of the body. These are called peripheral nerves. With peripheral neuropathy, one nerve or a group of nerves may be damaged. What are the causes? Many things can damage peripheral nerves. For some people with peripheral neuropathy, the cause is unknown. Some causes include:  Diabetes. This is the most common cause of peripheral neuropathy.  Injury to  a nerve.  Pressure or stress on a nerve that lasts a long time.  Too little vitamin B. Alcoholism can lead to this.  Infections.  Autoimmune diseases, such as multiple sclerosis and systemic lupus erythematosus.  Inherited nerve diseases.  Some medicines,  such as cancer drugs.  Toxic substances, such as lead and mercury.  Too little blood flowing to the legs.  Kidney disease.  Thyroid disease.  What are the signs or symptoms? Different people have different symptoms. The symptoms you have will depend on which of your nerves is damaged. Common symptoms include:  Loss of feeling (numbness) in the feet and hands.  Tingling in the feet and hands.  Pain that burns.  Very sensitive skin.  Weakness.  Not being able to move a part of the body (paralysis).  Muscle twitching.  Clumsiness or poor coordination.  Loss of balance.  Not being able to control your bladder.  Feeling dizzy.  Sexual problems.  How is this diagnosed? Peripheral neuropathy is a symptom, not a disease. Finding the cause of peripheral neuropathy can be hard. To figure that out, your health care provider will take a medical history and do a physical exam. A neurological exam will also be done. This involves checking things affected by your brain, spinal cord, and nerves (nervous system). For example, your health care provider will check your reflexes, how you move, and what you can feel. Other types of tests may also be ordered, such as:  Blood tests.  A test of the fluid in your spinal cord.  Imaging tests, such as CT scans or an MRI.  Electromyography (EMG). This test checks the nerves that control muscles.  Nerve conduction velocity tests. These tests check how fast messages pass through your nerves.  Nerve biopsy. A small piece of nerve is removed. It is then checked under a microscope.  How is this treated?  Medicine is often used to treat peripheral neuropathy. Medicines may include: ? Pain-relieving medicines. Prescription or over-the-counter medicine may be suggested. ? Antiseizure medicine. This may be used for pain. ? Antidepressants. These also may help ease pain from neuropathy. ? Lidocaine. This is a numbing medicine. You might wear a  patch or be given a shot. ? Mexiletine. This medicine is typically used to help control irregular heart rhythms.  Surgery. Surgery may be needed to relieve pressure on a nerve or to destroy a nerve that is causing pain.  Physical therapy to help movement.  Assistive devices to help movement. Follow these instructions at home:  Only take over-the-counter or prescription medicines as directed by your health care provider. Follow the instructions carefully for any given medicines. Do not take any other medicines without first getting approval from your health care provider.  If you have diabetes, work closely with your health care provider to keep your blood sugar under control.  If you have numbness in your feet: ? Check every day for signs of injury or infection. Watch for redness, warmth, and swelling. ? Wear padded socks and comfortable shoes. These help protect your feet.  Do not do things that put pressure on your damaged nerve.  Do not smoke. Smoking keeps blood from getting to damaged nerves.  Avoid or limit alcohol. Too much alcohol can cause a lack of B vitamins. These vitamins are needed for healthy nerves.  Develop a good support system. Coping with peripheral neuropathy can be stressful. Talk to a mental health specialist or join a support group if you are struggling.  Follow up with your health care provider as directed. Contact a health care provider if:  You have new signs or symptoms of peripheral neuropathy.  You are struggling emotionally from dealing with peripheral neuropathy.  You have a fever. Get help right away if:  You have an injury or infection that is not healing.  You feel very dizzy or begin vomiting.  You have chest pain.  You have trouble breathing. This information is not intended to replace advice given to you by your health care provider. Make sure you discuss any questions you have with your health care provider. Document Released:  04/27/2002 Document Revised: 10/13/2015 Document Reviewed: 01/12/2013 Elsevier Interactive Patient Education  2017 [employer].

## 2016-12-03 NOTE — Progress Notes (Signed)
Subjective:    Patient ID: Leonard Ramos, male    DOB: 08-20-1955, 61 y.o.   MRN: [id number]  HPI  [name], a 61 year old male presents  for a follow-up of ETOH abuse, depression,  and neuropathy.  He has continued to have neuropathy and daily alcohol use.  Patient has had neuropathy for greater 1 year. [name] is also complaining of burning pain to left side. Patient states that he has weakness to upper and lower extremities, which is exacerbated by increased activity. He describes pain as burning, with pins and needle sensations to left upper and lower extremities. Patient denies fever, headache, rash, slurred speech, memory loss, or IV drug use. He has been taking Gabapentin 3 times per day with moderate relief.   Leonard Ramos reports everyday alcohol use. He has cut down to one 40 ounce beer per day.  He states that he has been drinking every day for many years. He often feels guilty about his drinking. He also requires an early morning "eye opener" several times per week. His family has been trying to encourage him to quit. He has not attempted any treatment programs in the past. He states that alcohol intake has increased since being out of work.  Patient also smoke 1 pack of cigarettes per day. He reports that he has tried to quit. He is not interested in smoking cessation at this time.  Past Medical History:  Diagnosis Date  . Alcohol use   . Neuropathy    Social History   Social History  . Marital status: Single    Spouse name: N/A  . Number of children: N/A  . Years of education: N/A   Occupational History  . Not on file.   Social History Main Topics  . Smoking status: Current Every Day Smoker    Packs/day: 0.50    Types: Cigarettes  . Smokeless tobacco: Never Used  . Alcohol use 24.0 oz/week    40 Standard drinks or equivalent per week     Comment: 1-2 dsily  . Drug use: No  . Sexual activity: Yes    Partners: Female   Other Topics Concern  . Not on  file   Social History Narrative  . No narrative on file    Immunization History  Administered Date(s) Administered  . Influenza,inj,Quad PF,36+ Mos 03/08/2015, 03/08/2016  . Pneumococcal Polysaccharide-23 03/08/2015  . Tdap 03/08/2016   Social History   Social History  . Marital status: Single    Spouse name: N/A  . Number of children: N/A  . Years of education: N/A   Occupational History  . Not on file.   Social History Main Topics  . Smoking status: Current Every Day Smoker    Packs/day: 0.50    Types: Cigarettes  . Smokeless tobacco: Never Used  . Alcohol use 24.0 oz/week    40 Standard drinks or equivalent per week     Comment: 1-2 dsily  . Drug use: No  . Sexual activity: Yes    Partners: Female   Other Topics Concern  . Not on file   Social History Narrative  . No narrative on file   Review of Systems  Constitutional: Negative for fatigue.  HENT: Negative.   Eyes: Negative.  Negative for photophobia, pain and redness.  Respiratory: Negative.  Negative for shortness of breath.   Cardiovascular: Negative for chest pain, palpitations and leg swelling.  Gastrointestinal: Negative.   Endocrine: Negative.  Negative for cold intolerance,  heat intolerance, polydipsia, polyphagia and polyuria.  Genitourinary: Negative.   Musculoskeletal: Positive for myalgias.  Skin: Negative.   Allergic/Immunologic: Negative.  Negative for immunocompromised state.  Neurological: Positive for dizziness, tremors, weakness and numbness. Negative for seizures, syncope, facial asymmetry, speech difficulty and headaches.  Hematological: Negative.   Psychiatric/Behavioral: Positive for decreased concentration and depression. Negative for sleep disturbance. The patient is nervous/anxious.        Worsening depression       Objective:   Physical Exam  Constitutional: He appears well-developed.  Non-toxic appearance. He does not have a sickly appearance. No distress.  HENT:  Head:  Normocephalic and atraumatic.  Right Ear: Hearing, tympanic membrane, external ear and ear canal normal.  Left Ear: Hearing, tympanic membrane, external ear and ear canal normal.  Mouth/Throat: Uvula is midline and oropharynx is clear and moist. Abnormal dentition. Dental caries (partially edentulous) present.  Eyes: Pupils are equal, round, and reactive to light. Conjunctivae, EOM and lids are normal.  Neck: Trachea normal, normal range of motion and full passive range of motion without pain. Neck supple. Thyromegaly present.  Cardiovascular: Normal rate, regular rhythm and normal pulses.   Pulmonary/Chest: Effort normal and breath sounds normal.  Abdominal: Soft. Normal appearance. There is tenderness in the right upper quadrant.  Musculoskeletal:       Left shoulder: He exhibits decreased strength. He exhibits no spasm and normal pulse.       Left elbow: He exhibits no swelling.       Left wrist: He exhibits decreased range of motion. He exhibits no swelling.       Left knee: He exhibits no swelling and no erythema.  Lymphadenopathy:       Head (right side): No submental and no submandibular adenopathy present.       Head (left side): No submental and no submandibular adenopathy present.  Neurological: He is alert. He is not disoriented. He displays tremor (Bilateral resting tremor). No cranial nerve deficit or sensory deficit. He exhibits abnormal muscle tone (upper extremities). Coordination abnormal.  Skin: Skin is warm, dry and intact.      BP 128/86 (BP Location: Left Arm, Patient Position: Sitting, Cuff Size: Normal)   Pulse 88   Temp 98 F (36.7 C) (Oral)   Ht 5\' 11"  (1.803 m)   Wt 166 lb (75.3 kg)   SpO2 95%   BMI 23.15 kg/m  Assessment & Plan:    1. Neuropathy Mr. Reas complains of worsening neuropathy, will increase gabapentin to 400 mg BID - gabapentin (NEURONTIN) 400 MG capsule; Take 1 capsule (400 mg total) by mouth 3 (three) times daily.  Dispense: 90 capsule;  Refill: 5  2. Tobacco dependence Smoking cessation instruction/counseling given:  counseled patient on the dangers of tobacco use, advised patient to stop smoking, and reviewed strategies to maximize success  3. Left sided numbness - gabapentin (NEURONTIN) 400 MG capsule; Take 1 capsule (400 mg total) by mouth 3 (three) times daily.  Dispense: 90 capsule; Refill: 5  4. Generalized anxiety disorder  - busPIRone (BUSPAR) 10 MG tablet; Take 1 tablet (10 mg total) by mouth 2 (two) times daily.  Dispense: 60 tablet; Refill: 5 - FLUoxetine (PROZAC) 20 MG tablet; Take 1 tablet (20 mg total) by mouth daily.  Dispense: 30 tablet; Refill: 5  5. Depression, unspecified depression type Symptoms controlled on medication regimen, no changes warranted.  Depression screen [facility] 2/9 12/03/2016 12/03/2016 08/23/2016 08/23/2016 08/23/2016  Decreased Interest 1 1 3 3 3   Down,  Depressed, Hopeless 3 1 2  - 3  PHQ - 2 Score 4 2 5 3 6   Altered sleeping 2 - 3 - 3  Tired, decreased energy 0 - 3 - 3  Change in appetite 0 - 0 - 3  Feeling bad or failure about yourself  1 - 3 - 3  Trouble concentrating 1 - 3 - 0  Moving slowly or fidgety/restless 0 - 0 - 3  Suicidal thoughts 0 - 3 - 3  PHQ-9 Score 8 - 20 - 24  Difficult doing work/chores - - - - Very difficult    - busPIRone (BUSPAR) 10 MG tablet; Take 1 tablet (10 mg total) by mouth 2 (two) times daily.  Dispense: 60 tablet; Refill: 5 - FLUoxetine (PROZAC) 20 MG tablet; Take 1 tablet (20 mg total) by mouth daily.  Dispense: 30 tablet; Refill: 5  6. ETOH abuse Mr. Carey BullocksJarrell continues to drink alcohol daily.Patient is not interested in Alcohol and Drug Services at this time. Patient given information. [address] [place], [place], [phone]. ADS Triage is M-F 9am-11:30 am.   Nolon NationsLaChina Moore Nancyann Cotterman  MSN, FNP-C [facility] [address]  [place], [place] [address] [phone]

## 2017-03-06 ENCOUNTER — Ambulatory Visit (INDEPENDENT_AMBULATORY_CARE_PROVIDER_SITE_OTHER): Payer: Self-pay | Admitting: Family Medicine

## 2017-03-06 ENCOUNTER — Encounter: Payer: Self-pay | Admitting: Family Medicine

## 2017-03-06 VITALS — BP 131/86 | HR 83 | Temp 98.0°F | Resp 16 | Ht 71.0 in | Wt 162.0 lb

## 2017-03-06 DIAGNOSIS — F1010 Alcohol abuse, uncomplicated: Secondary | ICD-10-CM

## 2017-03-06 DIAGNOSIS — F411 Generalized anxiety disorder: Secondary | ICD-10-CM

## 2017-03-06 DIAGNOSIS — K0889 Other specified disorders of teeth and supporting structures: Secondary | ICD-10-CM

## 2017-03-06 DIAGNOSIS — Z23 Encounter for immunization: Secondary | ICD-10-CM

## 2017-03-06 DIAGNOSIS — R220 Localized swelling, mass and lump, head: Secondary | ICD-10-CM

## 2017-03-06 DIAGNOSIS — F17200 Nicotine dependence, unspecified, uncomplicated: Secondary | ICD-10-CM

## 2017-03-06 DIAGNOSIS — K08401 Partial loss of teeth, unspecified cause, class I: Secondary | ICD-10-CM

## 2017-03-06 LAB — CBC WITH DIFFERENTIAL/PLATELET
Basophils Absolute: 40 cells/uL (ref 0–200)
Basophils Relative: 0.8 %
EOS ABS: 30 {cells}/uL (ref 15–500)
Eosinophils Relative: 0.6 %
HCT: 48.5 % (ref 38.5–50.0)
Hemoglobin: 16.3 g/dL (ref 13.2–17.1)
Lymphs Abs: 1560 cells/uL (ref 850–3900)
MCH: 30.1 pg (ref 27.0–33.0)
MCHC: 33.6 g/dL (ref 32.0–36.0)
MCV: 89.5 fL (ref 80.0–100.0)
MONOS PCT: 15.1 %
MPV: 10.2 fL (ref 7.5–12.5)
NEUTROS PCT: 52.3 %
Neutro Abs: 2615 cells/uL (ref 1500–7800)
PLATELETS: 186 10*3/uL (ref 140–400)
RBC: 5.42 10*6/uL (ref 4.20–5.80)
RDW: 14.4 % (ref 11.0–15.0)
TOTAL LYMPHOCYTE: 31.2 %
WBC: 5 10*3/uL (ref 3.8–10.8)
WBCMIX: 755 {cells}/uL (ref 200–950)

## 2017-03-06 LAB — COMPLETE METABOLIC PANEL WITH GFR
AG Ratio: 1.6 (calc) (ref 1.0–2.5)
ALT: 54 U/L — AB (ref 9–46)
AST: 72 U/L — AB (ref 10–35)
Albumin: 4.3 g/dL (ref 3.6–5.1)
Alkaline phosphatase (APISO): 63 U/L (ref 40–115)
BILIRUBIN TOTAL: 0.6 mg/dL (ref 0.2–1.2)
BUN: 7 mg/dL (ref 7–25)
CHLORIDE: 102 mmol/L (ref 98–110)
CO2: 26 mmol/L (ref 20–32)
Calcium: 9.3 mg/dL (ref 8.6–10.3)
Creat: 0.92 mg/dL (ref 0.70–1.33)
GFR, Est African American: 105 mL/min/{1.73_m2} (ref >=60)
GFR, Est Non African American: 91 mL/min/{1.73_m2} (ref >=60)
GLUCOSE: 81 mg/dL (ref 65–99)
Globulin: 2.7 g/dL (calc) (ref 1.9–3.7)
Potassium: 5 mmol/L (ref 3.5–5.3)
Sodium: 138 mmol/L (ref 135–146)
TOTAL PROTEIN: 7 g/dL (ref 6.1–8.1)

## 2017-03-06 MED ORDER — AMOXICILLIN 500 MG PO CAPS: 500.0000 mg | ORAL_CAPSULE | Freq: Three times a day (TID) | ORAL | 0 refills | Status: DC

## 2017-03-06 NOTE — Patient Instructions (Signed)
Dental pain/gum irritation, start Amoxicillin 500 mg three times per day for 7 days.  For dental pain Ibuprofen 800 three times per day with food.   Continue all other medications as previously prescribed   Patient is not interested in Alcohol and drug services at this time Alcohol and Drug Services  [phone] [address]  [place], [place]  Dental Pain Dental pain may be caused by many things, including:  Tooth decay (cavities or caries). Cavities expose the nerve of your tooth to air and hot or cold temperatures. This can cause pain or discomfort.  Abscess or infection. A dental abscess is a collection of infected pus from a bacterial infection in the inner part of the tooth (pulp). It usually occurs at the end of the tooth's root.  Injury.  An unknown reason (idiopathic).  Your pain may be mild or severe. It may only occur when:  You are chewing.  You are exposed to hot or cold temperature.  You are eating or drinking sugary foods or beverages, such as soda or candy.  Your pain may also be constant. Follow these instructions at home: Watch your dental pain for any changes. The following actions may help to lessen any discomfort that you are feeling:  Take medicines only as directed by your dentist.  If you were prescribed an antibiotic medicine, finish all of it even if you start to feel better.  Keep all follow-up visits as directed by your dentist. This is important.  Do not apply heat to the outside of your face.  Rinse your mouth or gargle with salt water if directed by your dentist. This helps with pain and swelling. ? You can make salt water by adding  tsp of salt to 1 cup of warm water.  Apply ice to the painful area of your face: ? Put ice in a plastic bag. ? Place a towel between your skin and the bag. ? Leave the ice on for 20 minutes, 2-3 times per day.  Avoid foods or drinks that cause you pain, such as: ? Very hot or very cold foods or  drinks. ? Sweet or sugary foods or drinks.  Contact a health care provider if:  Your pain is not controlled with medicines.  Your symptoms are worse.  You have new symptoms. Get help right away if:  You are unable to open your mouth.  You are having trouble breathing or swallowing.  You have a fever.  Your face, neck, or jaw is swollen. This information is not intended to replace advice given to you by your health care provider. Make sure you discuss any questions you have with your health care provider. Document Released: 05/07/2005 Document Revised: 09/15/2015 Document Reviewed: 05/03/2014 Elsevier Interactive Patient Education  2017 [employer]

## 2017-03-06 NOTE — Progress Notes (Signed)
Subjective:    Patient ID: Leonard Ramos, male    DOB: [birth year], 61 y.o.   MRN: [id number]  HPI  [name], a 61 year old male presents  for a follow-up of ETOH abuse, depression,  and neuropathy.  He has continued to have neuropathy and daily alcohol use.  Patient has had neuropathy for greater 1 year. [name] is also complaining of burning pain. Patient states that he has weakness to upper and lower extremities, which is exacerbated by increased activity. He describes pain as burning, with pins and needle sensations to left upper and lower extremities. Patient denies fever, headache, rash, slurred speech, memory loss, or IV drug use. He has been taking Gabapentin 3 times per day with some relief.   [name] is also complaining of dental pain for greater than 1 months. He says that he has not been to a dentist in greater than 10 years. He describes pain as 7/10 described as constant and throbbing. He has been taking Ibuprofen OTC without sustained relief. Dental pain is aggravated by eating hot or cold foods.    [name] reports everyday alcohol use. He has cut down to one 40 ounce beer per day.  He states that he has been drinking every day for many years. He often feels guilty about his drinking. He also requires an early morning "eye opener" several times per week. His family has been trying to encourage him to quit. He has not attempted any treatment programs in the past. He states that alcohol intake has increased since being out of work.  Patient also smoke 1 pack of cigarettes per day. He reports that he has tried to quit. He is not interested in smoking cessation at this time.  Past Medical History:  Diagnosis Date  . Alcohol use   . Neuropathy    Social History   Social History  . Marital status: Single    Spouse name: N/A  . Number of children: N/A  . Years of education: N/A   Occupational History  . Not on file.   Social History Main Topics  . Smoking  status: Current Every Day Smoker    Packs/day: 0.50    Types: Cigarettes  . Smokeless tobacco: Never Used  . Alcohol use 24.0 oz/week    40 Standard drinks or equivalent per week     Comment: 1-2 dsily  . Drug use: No  . Sexual activity: Yes    Partners: Female   Other Topics Concern  . Not on file   Social History Narrative  . No narrative on file    Immunization History  Administered Date(s) Administered  . Influenza,inj,Quad PF,6+ Mos 03/08/2015, 03/08/2016  . Pneumococcal Polysaccharide-23 03/08/2015  . Tdap 03/08/2016   Social History   Social History  . Marital status: Single    Spouse name: N/A  . Number of children: N/A  . Years of education: N/A   Occupational History  . Not on file.   Social History Main Topics  . Smoking status: Current Every Day Smoker    Packs/day: 0.50    Types: Cigarettes  . Smokeless tobacco: Never Used  . Alcohol use 24.0 oz/week    40 Standard drinks or equivalent per week     Comment: 1-2 dsily  . Drug use: No  . Sexual activity: Yes    Partners: Female   Other Topics Concern  . Not on file   Social History Narrative  . No narrative on  file   Review of Systems  Constitutional: Negative for fatigue.  HENT: Positive for dental problem.   Eyes: Negative.  Negative for photophobia, pain and redness.  Respiratory: Negative.  Negative for shortness of breath.   Cardiovascular: Negative for chest pain, palpitations and leg swelling.  Gastrointestinal: Negative.   Endocrine: Negative.  Negative for cold intolerance, heat intolerance, polydipsia, polyphagia and polyuria.  Genitourinary: Negative.   Musculoskeletal: Positive for myalgias.  Skin: Negative.   Allergic/Immunologic: Negative.  Negative for immunocompromised state.  Neurological: Positive for tremors and numbness. Negative for seizures, syncope, facial asymmetry, speech difficulty and headaches.  Hematological: Negative.   Psychiatric/Behavioral: Negative for  sleep disturbance. The patient is nervous/anxious.        Worsening depression       Objective:   Physical Exam  Constitutional: He appears well-developed.  Non-toxic appearance. He does not have a sickly appearance. No distress.  HENT:  Head: Normocephalic and atraumatic.  Right Ear: Hearing, tympanic membrane, external ear and ear canal normal.  Left Ear: Hearing, tympanic membrane, external ear and ear canal normal.  Mouth/Throat: Uvula is midline and oropharynx is clear and moist. Abnormal dentition. Dental caries (partially edentulous) present.  Eyes: Pupils are equal, round, and reactive to light. Conjunctivae, EOM and lids are normal.  Neck: Trachea normal, normal range of motion and full passive range of motion without pain. Neck supple. Thyromegaly present.  Cardiovascular: Normal rate, regular rhythm and normal pulses.   Pulmonary/Chest: Effort normal and breath sounds normal.  Abdominal: Soft. Normal appearance. There is tenderness in the right upper quadrant.  Musculoskeletal:       Left shoulder: He exhibits decreased strength. He exhibits no spasm and normal pulse.       Left elbow: He exhibits no swelling.       Left wrist: He exhibits decreased range of motion. He exhibits no swelling.       Left knee: He exhibits no swelling and no erythema.  Lymphadenopathy:       Head (right side): No submental and no submandibular adenopathy present.       Head (left side): No submental and no submandibular adenopathy present.  Neurological: He is alert. He is not disoriented. He displays tremor (Bilateral resting tremor). No cranial nerve deficit or sensory deficit. He exhibits abnormal muscle tone (upper extremities). Coordination abnormal.  Skin: Skin is warm, dry and intact.      BP 131/86 (BP Location: Right Arm, Patient Position: Sitting, Cuff Size: Normal)   Pulse 83   Temp 98 F (36.7 C) (Oral)   Resp 16   Ht 5\' 11"  (1.803 m)   Wt 162 lb (73.5 kg)   SpO2 99%   BMI  22.59 kg/m  Assessment & Plan:  1. Pain, dental - Ambulatory referral to Dentistry - amoxicillin (AMOXIL) 500 MG capsule; Take 1 capsule (500 mg total) by mouth 3 (three) times daily.  Dispense: 30 capsule; Refill: 0  2. Partially edentulous mandible, class I edentulism - Ambulatory referral to Dentistry  3. Tobacco dependence Smoking cessation instruction/counseling given:  counseled patient on the dangers of tobacco use, advised patient to stop smoking, and reviewed strategies to maximize success  4. Generalized anxiety disorder Will continue Buspar as previously prescribed.   5. ETOH abuse Patient has been referral to alcohol treatment facilities Also, given information for alcohol and drug services.  - CBC with Differential - COMPLETE METABOLIC PANEL WITH GFR  6. Influenza vaccination given - Flu Vaccine QUAD 6+ mos  PF IM (Fluarix Quad PF)  7. Swollen gums Gums swollen and erythematous.  Patient to complete financial assistance forms. Will send a referral to dentist.  - amoxicillin (AMOXIL) 500 MG capsule; Take 1 capsule (500 mg total) by mouth 3 (three) times daily.  Dispense: 30 capsule; Refill: 0   RTC: 3 months for chronic conditions   Nolon Nations  MSN, FNP-C Patient [facility] [address] [place], [place] [address] [phone]

## 2017-03-14 ENCOUNTER — Telehealth: Payer: Self-pay

## 2017-03-14 ENCOUNTER — Other Ambulatory Visit: Payer: Self-pay | Admitting: Family Medicine

## 2017-03-14 DIAGNOSIS — R748 Abnormal levels of other serum enzymes: Secondary | ICD-10-CM

## 2017-03-14 NOTE — Telephone Encounter (Signed)
-----   Message from Massie MaroonLachina M Hollis, [place] sent at 03/14/2017  5:37 AM EDT ----- Regarding: schedule ultrasound Please inform patient that liver enzymes have increased. Please schedule abdominal ultrasound. Will schedule follow up after reviewing ultrasound.    Thanks

## 2017-03-14 NOTE — Telephone Encounter (Signed)
Called and spoke with patient, advised of elevated liver enzymes and the need to have abdominal ultrasound done. Ultrasound was scheduled for 03/18/2017 @10 :15am and patient was made aware. Thanks!

## 2017-03-14 NOTE — Progress Notes (Signed)
Reviewed labs. Liver enzymes elevated. Will evaluate further per liver ultrasound to rule out cirrhosis. Patient has a history of alcohol dependence.    Leonard NationsLaChina Ramos Leonard Lanzo  MSN, FNP-C Patient Care [facility] [address]  [place], [place] [address] [phone]

## 2017-03-18 ENCOUNTER — Ambulatory Visit (HOSPITAL_COMMUNITY)
Admission: RE | Admit: 2017-03-18 | Discharge: 2017-03-18 | Disposition: A | Discharge status: Home / Self Care | Payer: Self-pay | Source: Ambulatory Visit | Attending: Family Medicine | Admitting: Family Medicine

## 2017-03-18 DIAGNOSIS — N281 Cyst of kidney, acquired: Secondary | ICD-10-CM | POA: Insufficient documentation

## 2017-03-18 DIAGNOSIS — R748 Abnormal levels of other serum enzymes: Secondary | ICD-10-CM | POA: Insufficient documentation

## 2017-03-18 DIAGNOSIS — I700 Atherosclerosis of aorta: Secondary | ICD-10-CM | POA: Insufficient documentation

## 2017-03-19 ENCOUNTER — Other Ambulatory Visit: Payer: Self-pay | Admitting: Family Medicine

## 2017-03-19 ENCOUNTER — Encounter: Payer: Self-pay | Admitting: Family Medicine

## 2017-03-19 DIAGNOSIS — K760 Fatty (change of) liver, not elsewhere classified: Secondary | ICD-10-CM

## 2017-03-19 DIAGNOSIS — I700 Atherosclerosis of aorta: Secondary | ICD-10-CM | POA: Insufficient documentation

## 2017-03-19 MED ORDER — PRAVASTATIN SODIUM 20 MG PO TABS: 20.0000 mg | ORAL_TABLET | Freq: Every day | ORAL | 1 refills | Status: DC

## 2017-03-19 NOTE — Progress Notes (Signed)
Meds ordered this encounter  Medications  . pravastatin (PRAVACHOL) 20 MG tablet    Sig: Take 1 tablet (20 mg total) by mouth daily.    Dispense:  90 tablet    Refill:  1    Nolon NationsLaChina Moore Rube Sanchez  MSN, FNP-C Patient [facility] [address]  [place], [place] [id number] [phone]

## 2017-03-19 NOTE — Progress Notes (Signed)
A 61 year old patient with a long history of alcohol dependence was found to have elevated aminotransferases during the previous visit. Patient was sent for a liver ultrasound, which yielded the following results:  CLINICAL DATA:  Elevated liver enzymes  EXAM: ABDOMEN ULTRASOUND COMPLETE  COMPARISON:  None.  FINDINGS: Gallbladder: No gallstones or wall thickening visualized. There is no pericholecystic fluid. No sonographic Murphy sign noted by sonographer.  Common bile duct: Diameter: 5 mm. There is no intrahepatic, common hepatic, or common bile duct dilatation.  Liver: No focal lesion identified. Liver echogenicity is increased diffusely. Portal vein is patent on color Doppler imaging with normal direction of blood flow towards the liver.  IVC: No abnormality visualized.  Pancreas: No pancreatic mass or inflammatory focus.  Spleen: Size and appearance within normal limits.  Right Kidney: Length: 11.9 cm. Echogenicity within normal limits. No hydronephrosis visualized. There is a cyst arising from the lower pole right kidney measuring 4.8 x 4.1 x 4.5 cm. There is a cyst arising from the mid right kidney measuring 1.1 x 0.8 x 0.9 cm.  Left Kidney: Length: 11.6 cm. Echogenicity within normal limits. No hydronephrosis visualized. There is a cyst arising from the mid left kidney measuring 1.4 x 1.0 x 1.2 cm.  Abdominal aorta: No aneurysm visualized. Atherosclerotic plaque is noted in the aorta.  Other findings: There is no demonstrable ascites.  IMPRESSION: 1. Diffuse increased liver echogenicity, a finding most likely indicative of hepatic steatosis. While no focal liver lesions are evident on this study, it must be cautioned that sensitivity of ultrasound for detection of focal liver lesions is diminished in this circumstance.  2.  Renal cysts bilaterally, largest on the right.  3.  Aortic atherosclerosis.  4.  Study otherwise  unremarkable.  Aortic Atherosclerosis (ICD10-I70.0).  Will start statin therapy cautiously with a low dose of Pravastatin, which is not extensively metabolized in the liver. Aminotransferases are not markedly elevated at this time. Will evaluate th efficacy and impact on aminotranferases in 6 weeks. Will also start daily aspirin.    Nolon NationsLaChina Moore Allannah Kempen  MSN, FNP-C Patient Care [facility] [address]  [place], [place] [address] [phone]

## 2017-03-19 NOTE — Progress Notes (Signed)
Called and spoke with patient. Informed that ultrasound shows fatty liver which is secondary to heavy alcohol use. Advised that fatty liver is the precursor to liver cirrhosis and that it is very important that patient decrease daily alcohol use. Advised that ultrasound also showed aortic atherosclerosis which is harding of the aortic artery. Advised that we will start pravastatin trial of 20mg  once daily every evening with dinner and will recheck in 6 weeks. Patient has been scheduled. Patient verbalized understanding and had no questions at this time. Thanks!

## 2017-04-23 ENCOUNTER — Ambulatory Visit: Payer: Self-pay | Attending: Internal Medicine

## 2017-04-30 ENCOUNTER — Other Ambulatory Visit: Payer: Self-pay

## 2017-05-06 ENCOUNTER — Other Ambulatory Visit: Payer: Self-pay

## 2017-05-08 ENCOUNTER — Other Ambulatory Visit (INDEPENDENT_AMBULATORY_CARE_PROVIDER_SITE_OTHER): Payer: Self-pay

## 2017-05-08 DIAGNOSIS — K760 Fatty (change of) liver, not elsewhere classified: Secondary | ICD-10-CM

## 2017-05-08 DIAGNOSIS — I700 Atherosclerosis of aorta: Secondary | ICD-10-CM

## 2017-05-09 LAB — LIPID PANEL
CHOL/HDL RATIO: 2.4 ratio (ref 0.0–5.0)
Cholesterol, Total: 170 mg/dL (ref 100–199)
HDL: 72 mg/dL (ref >39)
LDL Calculated: 82 mg/dL (ref 0–99)
Triglycerides: 81 mg/dL (ref 0–149)
VLDL CHOLESTEROL CAL: 16 mg/dL (ref 5–40)

## 2017-05-09 LAB — HEPATIC FUNCTION PANEL
ALT: 38 IU/L (ref 0–44)
AST: 66 IU/L — AB (ref 0–40)
Albumin: 4.5 g/dL (ref 3.5–5.5)
Alkaline Phosphatase: 72 IU/L (ref 39–117)
BILIRUBIN TOTAL: 0.4 mg/dL (ref 0.0–1.2)
BILIRUBIN, DIRECT: 0.12 mg/dL (ref 0.00–0.40)
Total Protein: 7.2 g/dL (ref 6.0–8.5)

## 2017-05-15 ENCOUNTER — Telehealth: Payer: Self-pay

## 2017-05-15 NOTE — Telephone Encounter (Signed)
-----   Message from Massie MaroonLachina M Hollis, [place] sent at 05/12/2017  7:16 AM EST ----- Regarding: lab results  Please inform patient that cholesterol is within a normal range. Liver enzymes have improved. Please continue to decrease daily alcohol use.   Thanks ----- Message ----- From: Nell RangeInterface, [facility] Lab Results In Sent: 05/09/2017   5:44 AM To: Massie MaroonLachina M Hollis, FNP

## 2017-05-15 NOTE — Telephone Encounter (Signed)
Spoke with patient, advised that cholesterol is normal and liver enzymes have improved. Advised to continue to decrease daily alcohol use and keep next scheduled follow up. Patient verbalized understanding. Thanks!

## 2017-07-03 ENCOUNTER — Telehealth: Payer: Self-pay | Admitting: Family Medicine

## 2017-07-03 NOTE — Telephone Encounter (Signed)
Pt called to request the status of his OC and CAFA. Please follow up

## 2017-07-04 ENCOUNTER — Ambulatory Visit (INDEPENDENT_AMBULATORY_CARE_PROVIDER_SITE_OTHER): Payer: Self-pay | Admitting: Family Medicine

## 2017-07-04 ENCOUNTER — Encounter: Payer: Self-pay | Admitting: Family Medicine

## 2017-07-04 VITALS — BP 130/82 | HR 80 | Temp 97.5°F | Resp 16 | Ht 71.0 in | Wt 166.0 lb

## 2017-07-04 DIAGNOSIS — F32A Depression, unspecified: Secondary | ICD-10-CM

## 2017-07-04 DIAGNOSIS — G629 Polyneuropathy, unspecified: Secondary | ICD-10-CM

## 2017-07-04 DIAGNOSIS — F411 Generalized anxiety disorder: Secondary | ICD-10-CM

## 2017-07-04 DIAGNOSIS — K625 Hemorrhage of anus and rectum: Secondary | ICD-10-CM

## 2017-07-04 DIAGNOSIS — F17200 Nicotine dependence, unspecified, uncomplicated: Secondary | ICD-10-CM

## 2017-07-04 DIAGNOSIS — F329 Major depressive disorder, single episode, unspecified: Secondary | ICD-10-CM

## 2017-07-04 NOTE — Progress Notes (Signed)
Subjective:    Patient ID: Leonard Ramos, male    DOB: 1955-09-30, 62 y.o.   MRN: [id number]  HPI  [name], a 62 year old male presents  for a follow-up of ETOH abuse, depression,  and neuropathy.  He has continued to have neuropathy and daily alcohol use.  Patient has had neuropathy for greater 1 year. [name] is also complaining of burning pain. Patient states that he has weakness to upper and lower extremities, which is exacerbated by increased activity. He describes pain as burning, with pins and needle sensations to left upper and lower extremities. Patient denies fever, headache, rash, slurred speech, memory loss, or IV drug use. He has been taking Gabapentin 3 times per day without sustained relief.   [name] reports everyday alcohol use. He has cut down to one 40 ounce beer per day.  He states that he has been drinking every day for many years. He often feels guilty about his drinking. He also requires an early morning "eye opener" several times per week. His family has been trying to encourage him to quit. He has not attempted any treatment programs in the past. He states that alcohol intake has increased since being out of work.  Patient also smoke 1 pack of cigarettes per day. He reports that he has tried to quit. He is not interested in smoking cessation at this time.   Patient is also complaining of periodic rectal bleeding. Blood is primarily on tissue following defecation. He denies constipation, abdominal pain, or rectal strain. [name] has never had a colonoscopy. Most recent abdominal ultrasound shows liver steatosis and renal cysts.       Review of Systems  Constitutional: Positive for fatigue.  Eyes: Negative.  Negative for photophobia, pain and redness.  Respiratory: Positive for cough. Negative for shortness of breath.   Cardiovascular: Negative for chest pain, palpitations and leg swelling.  Gastrointestinal: Positive for anal bleeding, blood in  stool and hematochezia.  Endocrine: Negative.  Negative for cold intolerance, heat intolerance, polydipsia, polyphagia and polyuria.  Genitourinary: Negative.  Negative for dysuria.  Musculoskeletal: Positive for myalgias.  Skin: Negative.   Allergic/Immunologic: Negative.  Negative for immunocompromised state.  Neurological: Positive for tremors and numbness. Negative for dizziness, seizures, syncope, facial asymmetry, speech difficulty, light-headedness and headaches.  Hematological: Negative.   Psychiatric/Behavioral: Negative for sleep disturbance. The patient is nervous/anxious.        Worsening depression       Objective:   Physical Exam  Constitutional: He appears well-developed.  Non-toxic appearance. He does not have a sickly appearance. No distress.  HENT:  Head: Normocephalic and atraumatic.  Right Ear: Hearing, tympanic membrane, external ear and ear canal normal.  Left Ear: Hearing, tympanic membrane, external ear and ear canal normal.  Mouth/Throat: Uvula is midline and oropharynx is clear and moist. Abnormal dentition. Dental caries (partially edentulous) present.  Eyes: Conjunctivae, EOM and lids are normal. Pupils are equal, round, and reactive to light.  Neck: Trachea normal, normal range of motion and full passive range of motion without pain. Neck supple. Thyromegaly present.  Cardiovascular: Normal rate, regular rhythm and normal pulses.  Pulmonary/Chest: Effort normal and breath sounds normal.  Abdominal: Soft. Normal appearance. There is tenderness in the right upper quadrant.  Musculoskeletal:       Left shoulder: He exhibits decreased strength. He exhibits no spasm and normal pulse.       Left elbow: He exhibits no swelling.  Left wrist: He exhibits decreased range of motion. He exhibits no swelling.       Left knee: He exhibits no swelling and no erythema.  Lymphadenopathy:       Head (right side): No submental and no submandibular adenopathy present.        Head (left side): No submental and no submandibular adenopathy present.  Neurological: He is alert. He is not disoriented. He displays tremor (Bilateral resting tremor). No cranial nerve deficit or sensory deficit. He exhibits abnormal muscle tone (upper extremities). Coordination abnormal.  Skin: Skin is warm, dry and intact.      BP 130/82 (BP Location: Right Arm, Patient Position: Sitting, Cuff Size: Normal)   Pulse 80   Temp (!) 97.5 F (36.4 C) (Oral)   Resp 16   Ht 5\' 11"  (1.803 m)   Wt 166 lb (75.3 kg)   SpO2 99%   BMI 23.15 kg/m  Assessment & Plan:  1. Generalized anxiety disorder Symptoms improved, will continue Buspar 10 mg BID  2. Depression, unspecified depression type Patient currently denies suicidal or homicidal ideations. Will continue Buspar 10 mg BID.   3. Rectal bleeding On rectal exam, no fissures or hemorrhoids present. Will discontinue Ibuprofen 800 mg TID. Also, discussed the importance of refraining from using NSAIDs. Patient warrants a referral to gastroenterology for further workup and evaluation. Patient does not have a payer source. Given information for [facility].  - CBC with Differential - IFOBT POC (occult bld, rslt in office); Future - CMP and Liver  4. Tobacco dependence Smoking cessation instruction/counseling given:  counseled patient on the dangers of tobacco use, advised patient to stop smoking, and reviewed strategies to maximize success  5. Neuropathy Gabapentin increased to 400 mg 4 times per day.   6. Alcohol abuse   [facility] [address] [place] [phone]  RTC: 6 months for chronic conditions  Nolon Nations  MSN, FNP-C Patient Care [facility] [address] [place], [place] [address] [phone]

## 2017-07-04 NOTE — Patient Instructions (Addendum)
You have had some rectal bleeding, your occult blood test was positive for blood in the stool.  Will discontinue your ibuprofen 800 mg 3 times a day.  Refrain from taking any over-the-counter NSAIDs or naproxen.  Will also discontinue aspirin 81 mg daily.  You warrant a referral to gastroenterology.  I recommend that you complete financial assistance forms to qualify for payer source.  I will send a referral at that time.  For neuropathy we will increase gabapentin to 600 mg 3 times a day.  I also recommend that you continue to cut down on alcohol.  Congratulations on reducing alcohol use to 3 times per week.  I recommend that you follow-up with alcohol and drug services for a plan to cut down on alcohol use further.   [facility] [address] [place] [phone]

## 2017-07-05 ENCOUNTER — Other Ambulatory Visit: Payer: Self-pay | Admitting: Family Medicine

## 2017-07-05 ENCOUNTER — Telehealth: Payer: Self-pay

## 2017-07-05 DIAGNOSIS — G629 Polyneuropathy, unspecified: Secondary | ICD-10-CM

## 2017-07-05 DIAGNOSIS — R200 Anesthesia of skin: Secondary | ICD-10-CM

## 2017-07-05 LAB — CMP AND LIVER
ALK PHOS: 77 IU/L (ref 39–117)
ALT: 36 IU/L (ref 0–44)
AST: 39 IU/L (ref 0–40)
Albumin: 4.3 g/dL (ref 3.6–4.8)
BILIRUBIN, DIRECT: 0.12 mg/dL (ref 0.00–0.40)
BUN: 11 mg/dL (ref 8–27)
Bilirubin Total: 0.4 mg/dL (ref 0.0–1.2)
CO2: 22 mmol/L (ref 20–29)
CREATININE: 1.06 mg/dL (ref 0.76–1.27)
Calcium: 9.7 mg/dL (ref 8.6–10.2)
Chloride: 104 mmol/L (ref 96–106)
GFR calc Af Amer: 88 mL/min/{1.73_m2} (ref >59)
GFR calc non Af Amer: 76 mL/min/{1.73_m2} (ref >59)
Glucose: 98 mg/dL (ref 65–99)
POTASSIUM: 4.8 mmol/L (ref 3.5–5.2)
SODIUM: 141 mmol/L (ref 134–144)
TOTAL PROTEIN: 7 g/dL (ref 6.0–8.5)

## 2017-07-05 LAB — CBC WITH DIFFERENTIAL/PLATELET
BASOS: 0 %
Basophils Absolute: 0 10*3/uL (ref 0.0–0.2)
EOS (ABSOLUTE): 0.1 10*3/uL (ref 0.0–0.4)
EOS: 1 %
HEMATOCRIT: 48.8 % (ref 37.5–51.0)
Hemoglobin: 16.8 g/dL (ref 13.0–17.7)
IMMATURE GRANULOCYTES: 0 %
Immature Grans (Abs): 0 10*3/uL (ref 0.0–0.1)
Lymphocytes Absolute: 1.1 10*3/uL (ref 0.7–3.1)
Lymphs: 23 %
MCH: 31.6 pg (ref 26.6–33.0)
MCHC: 34.4 g/dL (ref 31.5–35.7)
MCV: 92 fL (ref 79–97)
MONOS ABS: 0.6 10*3/uL (ref 0.1–0.9)
Monocytes: 13 %
NEUTROS PCT: 63 %
Neutrophils Absolute: 3.2 10*3/uL (ref 1.4–7.0)
PLATELETS: 206 10*3/uL (ref 150–379)
RBC: 5.31 x10E6/uL (ref 4.14–5.80)
RDW: 15.4 % (ref 12.3–15.4)
WBC: 5 10*3/uL (ref 3.4–10.8)

## 2017-07-05 MED ORDER — GABAPENTIN 400 MG PO CAPS: 400.0000 mg | ORAL_CAPSULE | Freq: Four times a day (QID) | ORAL | 3 refills | Status: DC

## 2017-07-05 MED FILL — GABAPENTIN 400 MG CAPS: 400 | 30 days supply | Qty: 120 | Fill #0

## 2017-07-05 NOTE — Telephone Encounter (Signed)
-----   Message from Massie MaroonLachina M Hollis, [place]NP sent at 07/05/2017  5:55 AM EST ----- Regarding: lab results Please inform patient that all labs are within normal limits. Increased Gabapentin to 400 mg four times per day. [facility]. Will continue all other medications as previously prescribed.   Thanks

## 2017-07-05 NOTE — Telephone Encounter (Signed)
Called and spoke with patient, advised that all labs are within normal limits. Reminded to increase Gabapentin to 400mg  four times daily, and [facility]. Asked that he continue all other medications as previously prescribed and keep next scheduled appointment. Thanks!

## 2017-07-05 NOTE — Progress Notes (Signed)
Meds ordered this encounter  Medications  . gabapentin (NEURONTIN) 400 MG capsule    Sig: Take 1 capsule (400 mg total) by mouth 4 (four) times daily.    Dispense:  120 capsule    Refill:  3    Leonard NationsLachina Moore Nicolai Labonte  MSN, FNP-C Patient [facility] [address]  , [place] [id number] [phone]

## 2017-08-27 NOTE — Telephone Encounter (Signed)
Pt was mailed orange card and RX card.  CAFA was denied.  Pt needs to contact billing department for details.

## 2017-09-04 ENCOUNTER — Encounter: Payer: Self-pay | Admitting: Family Medicine

## 2017-09-04 ENCOUNTER — Ambulatory Visit (INDEPENDENT_AMBULATORY_CARE_PROVIDER_SITE_OTHER): Payer: Self-pay | Admitting: Family Medicine

## 2017-09-04 VITALS — BP 136/88 | HR 80 | Temp 97.8°F | Resp 16 | Ht 71.0 in | Wt 167.0 lb

## 2017-09-04 DIAGNOSIS — F1010 Alcohol abuse, uncomplicated: Secondary | ICD-10-CM

## 2017-09-04 DIAGNOSIS — K089 Disorder of teeth and supporting structures, unspecified: Secondary | ICD-10-CM

## 2017-09-04 DIAGNOSIS — F32A Depression, unspecified: Secondary | ICD-10-CM

## 2017-09-04 DIAGNOSIS — F17200 Nicotine dependence, unspecified, uncomplicated: Secondary | ICD-10-CM

## 2017-09-04 DIAGNOSIS — I700 Atherosclerosis of aorta: Secondary | ICD-10-CM

## 2017-09-04 DIAGNOSIS — F411 Generalized anxiety disorder: Secondary | ICD-10-CM

## 2017-09-04 DIAGNOSIS — F329 Major depressive disorder, single episode, unspecified: Secondary | ICD-10-CM

## 2017-09-04 DIAGNOSIS — G8929 Other chronic pain: Secondary | ICD-10-CM

## 2017-09-04 MED ORDER — BUSPIRONE HCL 10 MG PO TABS: 10.0000 mg | ORAL_TABLET | Freq: Two times a day (BID) | ORAL | 5 refills | Status: DC

## 2017-09-04 MED ORDER — VITAMIN B-1 100 MG PO TABS: 100.0000 mg | ORAL_TABLET | Freq: Every day | ORAL | 6 refills | Status: DC

## 2017-09-04 MED ORDER — FLUOXETINE HCL 20 MG PO TABS: 20.0000 mg | ORAL_TABLET | Freq: Every day | ORAL | 5 refills | Status: DC

## 2017-09-04 MED ORDER — PRAVASTATIN SODIUM 20 MG PO TABS: 20.0000 mg | ORAL_TABLET | Freq: Every day | ORAL | 1 refills | Status: DC

## 2017-09-04 MED FILL — PRAVASTATIN SODIUM 20 MG TA: 20 | 30 days supply | Qty: 30 | Fill #0

## 2017-09-04 MED FILL — FLUoxetine HCL 20 MG CAPS: 20 | 30 days supply | Qty: 30 | Fill #0

## 2017-09-04 MED FILL — busPIRone HCL 10 MG TABS: 10 | 30 days supply | Qty: 60 | Fill #0

## 2017-09-04 NOTE — Patient Instructions (Addendum)
I have sent a referral to dental clinic due to chronic dental pain. Recommend soft foods.  Continue to decrease daily alcohol use.  [facility] [address] [place] [phone] Recommend smoking cessation.   Recommend a balanced diet.        Dental Pain Dental pain may be caused by many things, including:  Tooth decay (cavities or caries). Cavities cause the nerve of your tooth to be open to air and hot or cold temperatures. This can cause pain or discomfort.  Abscess or infection. A dental abscess is an area that is full of infected pus from a bacterial infection in the inner part of the tooth (pulp). It usually happens at the end of the tooth's root.  Injury.  An unknown reason (idiopathic).  Your pain may be mild or severe. It may only happen when:  You are chewing.  You are exposed to hot or cold temperature.  You are eating or drinking sugary foods or beverages, such as: ? Soda. ? Candy.  Your pain may also be there all of the time. Follow these instructions at home: Watch your dental pain for any changes. Do these things to lessen your discomfort:  Take medicines only as told by your dentist.  If your dentist tells you to take an antibiotic medicine, finish all of it even if you start to feel better.  Keep all follow-up visits as told by your dentist. This is important.  Do not apply heat to the outside of your face.  Rinse your mouth or gargle with salt water if told by your dentist. This helps with pain and swelling. ? You can make salt water by adding  tsp of salt to 1 cup of warm water.  Apply ice to the painful area of your face: ? Put ice in a plastic bag. ? Place a towel between your skin and the bag. ? Leave the ice on for 20 minutes, 2-3 times per day.  Avoid foods or drinks that cause you pain, such as: ? Very hot or very cold foods or drinks. ? Sweet or sugary foods or drinks.  Contact a doctor if:  Your pain  is not helped with medicines.  Your symptoms are worse.  You have new symptoms. Get help right away if:  You cannot open your mouth.  You are having trouble breathing or swallowing.  You have a fever.  Your face, neck, or jaw is puffy (swollen). This information is not intended to replace advice given to you by your health care provider. Make sure you discuss any questions you have with your health care provider. Document Released: 10/24/2007 Document Revised: 10/13/2015 Document Reviewed: 05/03/2014 Elsevier Interactive Patient Education  [employer]2018 Elsevier Inc.

## 2017-09-04 NOTE — Progress Notes (Signed)
Subjective:    Patient ID: Leonard Ramos, male    DOB: 1956-03-04, 62 y.o.   MRN: [id number]  HPI  [name], a 62 year old male presents  for a follow-up of ETOH abuse, depression,  and neuropathy.   He has continued to have neuropathy and daily alcohol use.  Patient has a history of neuropathy. [name] is also complaining of pain. Patient states that he has weakness to upper and lower extremities, which is exacerbated by increased activity. He characterizes pain as burning, with pins and needle sensations to left upper and lower extremities. Patient denies fever, headache, rash, slurred speech, memory loss, or IV drug use. He has been taking Gabapentin 3 times per day without sustained relief.   [name] reports everyday alcohol use. He has cut down to one 40 ounce beer per day.  He states that he has been drinking every day for many years. He often feels guilty about his drinking. He also requires an early morning "eye opener" several times per week. His family has been trying to encourage him to quit. He has not attempted any treatment programs in the past. He states that alcohol intake has increased since being out of work.  Patient also smoke 1 pack of cigarettes per day. He reports that he has tried to quit. He is not interested in smoking cessation at this time.  Past Medical History:  Diagnosis Date  . Alcohol use   . Neuropathy    Social History   Socioeconomic History  . Marital status: Single    Spouse name: Not on file  . Number of children: Not on file  . Years of education: Not on file  . Highest education level: Not on file  Occupational History  . Not on file  Social Needs  . Financial resource strain: Not on file  . Food insecurity:    Worry: Not on file    Inability: Not on file  . Transportation needs:    Medical: Not on file    Non-medical: Not on file  Tobacco Use  . Smoking status: Current Every Day Smoker    Packs/day: 0.50    Types:  Cigarettes  . Smokeless tobacco: Never Used  Substance and Sexual Activity  . Alcohol use: Yes    Alcohol/week: 24.0 oz    Types: 40 Standard drinks or equivalent per week    Comment: 1-2 dsily  . Drug use: No  . Sexual activity: Yes    Partners: Female  Lifestyle  . Physical activity:    Days per week: Not on file    Minutes per session: Not on file  . Stress: Not on file  Relationships  . Social connections:    Talks on phone: Not on file    Gets together: Not on file    Attends religious service: Not on file    Active member of club or organization: Not on file    Attends meetings of clubs or organizations: Not on file    Relationship status: Not on file  . Intimate partner violence:    Fear of current or ex partner: Not on file    Emotionally abused: Not on file    Physically abused: Not on file    Forced sexual activity: Not on file  Other Topics Concern  . Not on file  Social History Narrative  . Not on file   Immunization History  Administered Date(s) Administered  . Influenza,inj,Quad PF,6+ Mos 03/08/2015, 03/08/2016,  03/06/2017  . Pneumococcal Polysaccharide-23 03/08/2015  . Tdap 03/08/2016   No Known Allergies Review of Systems  Constitutional: Positive for fatigue.  Eyes: Negative.  Negative for photophobia, pain and redness.  Respiratory: Positive for cough. Negative for shortness of breath.   Cardiovascular: Negative for chest pain, palpitations and leg swelling.  Gastrointestinal: Positive for anal bleeding and blood in stool.  Endocrine: Negative.  Negative for cold intolerance, heat intolerance, polydipsia, polyphagia and polyuria.  Genitourinary: Negative.  Negative for dysuria.  Musculoskeletal: Positive for myalgias.  Skin: Negative.   Allergic/Immunologic: Negative.  Negative for immunocompromised state.  Neurological: Positive for tremors and numbness. Negative for dizziness, seizures, syncope, facial asymmetry, speech difficulty,  light-headedness and headaches.  Hematological: Negative.   Psychiatric/Behavioral: Positive for depression. Negative for sleep disturbance. The patient is nervous/anxious.        Worsening depression       Objective:   Physical Exam  Constitutional: He appears well-developed.  Non-toxic appearance. He does not have a sickly appearance. No distress.  HENT:  Head: Normocephalic and atraumatic.  Right Ear: Hearing, tympanic membrane, external ear and ear canal normal.  Left Ear: Hearing, tympanic membrane, external ear and ear canal normal.  Mouth/Throat: Uvula is midline and oropharynx is clear and moist. Abnormal dentition. Dental caries (partially edentulous) present.  Eyes: Pupils are equal, round, and reactive to light. Conjunctivae, EOM and lids are normal.  Neck: Trachea normal, normal range of motion and full passive range of motion without pain. Neck supple. Thyromegaly present.  Cardiovascular: Normal rate, regular rhythm and normal pulses.  Pulmonary/Chest: Effort normal and breath sounds normal.  Abdominal: Soft. Normal appearance. There is tenderness in the right upper quadrant.  Musculoskeletal:       Left shoulder: He exhibits decreased strength. He exhibits no spasm and normal pulse.       Left elbow: He exhibits no swelling.       Left wrist: He exhibits decreased range of motion. He exhibits no swelling.       Left knee: He exhibits no swelling and no erythema.  Lymphadenopathy:       Head (right side): No submental and no submandibular adenopathy present.       Head (left side): No submental and no submandibular adenopathy present.  Neurological: He is alert. He is not disoriented. He displays tremor (Bilateral resting tremor). No cranial nerve deficit or sensory deficit. He exhibits abnormal muscle tone (upper extremities). Coordination abnormal.  Skin: Skin is warm, dry and intact.      BP 136/88 (BP Location: Left Arm, Patient Position: Sitting, Cuff Size: Normal)    Pulse 80   Temp 97.8 F (36.6 C) (Oral)   Resp 16   Ht 5\' 11"  (1.803 m)   Wt 167 lb (75.8 kg)   SpO2 96%   BMI 23.29 kg/m  Assessment & Plan:  1. Tobacco dependence Smoking cessation instruction/counseling given:  counseled patient on the dangers of tobacco use, advised patient to stop smoking, and reviewed strategies to maximize success  2. ETOH abuse  [facility] [address] [place] [phone]    - thiamine (VITAMIN B-1) 100 MG tablet; Take 1 tablet (100 mg total) by mouth daily.  Dispense: 30 tablet; Refill: 6  3. Depression, unspecified depression type - busPIRone (BUSPAR) 10 MG tablet; Take 1 tablet (10 mg total) by mouth 2 (two) times daily.  Dispense: 60 tablet; Refill: 5 - FLUoxetine (PROZAC) 20 MG tablet; Take 1 tablet (20 mg total)  by mouth daily.  Dispense: 30 tablet; Refill: 5  4. Chronic dental pain - Ambulatory referral to Dentistry  5. Generalized anxiety disorder - busPIRone (BUSPAR) 10 MG tablet; Take 1 tablet (10 mg total) by mouth 2 (two) times daily.  Dispense: 60 tablet; Refill: 5 - FLUoxetine (PROZAC) 20 MG tablet; Take 1 tablet (20 mg total) by mouth daily.  Dispense: 30 tablet; Refill: 5  6. Aortic atherosclerosis (HCC) The 10-year ASCVD risk score Denman George(Goff DC Jr., et al., 2013) is: 13.1%   Values used to calculate the score:     Age: [age] years     Sex: Male     Is Non-Hispanic African American: Yes     Diabetic: No     Tobacco smoker: Yes     Systolic Blood Pressure: 136 mmHg     Is BP treated: No     HDL Cholesterol: 72 mg/dL     Total Cholesterol: 170 mg/dL  - pravastatin (PRAVACHOL) 20 MG tablet; Take 1 tablet (20 mg total) by mouth daily.  Dispense: 90 tablet; Refill: 1   RTC: 3 months for chronic conditions  Nolon NationsLachina Moore Kingdom Vanzanten  MSN, FNP-C Patient Care [facility] [address]  [place], [place] [address] [phone]

## 2017-12-05 ENCOUNTER — Encounter: Payer: Self-pay | Admitting: Family Medicine

## 2017-12-05 ENCOUNTER — Ambulatory Visit (INDEPENDENT_AMBULATORY_CARE_PROVIDER_SITE_OTHER): Payer: Self-pay | Admitting: Family Medicine

## 2017-12-05 VITALS — BP 116/81 | HR 86 | Temp 98.0°F | Resp 16 | Ht 71.0 in | Wt 165.0 lb

## 2017-12-05 DIAGNOSIS — G8929 Other chronic pain: Secondary | ICD-10-CM

## 2017-12-05 DIAGNOSIS — R200 Anesthesia of skin: Secondary | ICD-10-CM

## 2017-12-05 DIAGNOSIS — R531 Weakness: Secondary | ICD-10-CM

## 2017-12-05 DIAGNOSIS — G629 Polyneuropathy, unspecified: Secondary | ICD-10-CM

## 2017-12-05 DIAGNOSIS — I700 Atherosclerosis of aorta: Secondary | ICD-10-CM

## 2017-12-05 DIAGNOSIS — F411 Generalized anxiety disorder: Secondary | ICD-10-CM

## 2017-12-05 DIAGNOSIS — F17200 Nicotine dependence, unspecified, uncomplicated: Secondary | ICD-10-CM

## 2017-12-05 DIAGNOSIS — K089 Disorder of teeth and supporting structures, unspecified: Secondary | ICD-10-CM

## 2017-12-05 DIAGNOSIS — F1010 Alcohol abuse, uncomplicated: Secondary | ICD-10-CM

## 2017-12-05 DIAGNOSIS — F329 Major depressive disorder, single episode, unspecified: Secondary | ICD-10-CM

## 2017-12-05 DIAGNOSIS — F32A Depression, unspecified: Secondary | ICD-10-CM

## 2017-12-05 MED ORDER — BUSPIRONE HCL 10 MG PO TABS: 10.0000 mg | ORAL_TABLET | Freq: Two times a day (BID) | ORAL | 5 refills | Status: DC

## 2017-12-05 MED ORDER — PRAVASTATIN SODIUM 20 MG PO TABS: 20.0000 mg | ORAL_TABLET | Freq: Every day | ORAL | 1 refills | Status: DC

## 2017-12-05 MED ORDER — CETIRIZINE HCL 10 MG PO TABS: 10.0000 mg | ORAL_TABLET | Freq: Every day | ORAL | 5 refills | Status: DC

## 2017-12-05 MED ORDER — FLUOXETINE HCL 20 MG PO TABS: 20.0000 mg | ORAL_TABLET | Freq: Every day | ORAL | 5 refills | Status: DC

## 2017-12-05 MED ORDER — ADULT MULTIVITAMIN W/MINERALS CH: 1.0000 | ORAL_TABLET | Freq: Every day | ORAL | 5 refills | Status: AC

## 2017-12-05 MED ORDER — FOLIC ACID 1 MG PO TABS: 1.0000 mg | ORAL_TABLET | Freq: Every day | ORAL | 6 refills | Status: DC

## 2017-12-05 MED ORDER — GABAPENTIN 400 MG PO CAPS: 400.0000 mg | ORAL_CAPSULE | Freq: Four times a day (QID) | ORAL | 3 refills | Status: DC

## 2017-12-05 NOTE — Patient Instructions (Signed)
Tobacco Use Disorder Tobacco use disorder (TUD) is a mental disorder. It is the long-term use of tobacco in spite of related health problems or difficulty with normal life activities. Tobacco is most commonly smoked as cigarettes and less commonly as cigars or pipes. Smokeless chewing tobacco and snuff are also popular. People with TUD get a feeling of extreme pleasure (euphoria) from using tobacco and have a desire to use it again and again. Repeated use of tobacco can cause problems. The addictive effects of tobacco are due mainly tothe ingredient nicotine. Nicotine also causes a rush of adrenaline (epinephrine) in the body. This leads to increased blood pressure, heart rate, and breathing rate. These changes may cause problems for people with high blood pressure, weak hearts, or lung disease. High doses of nicotine in children and pets can lead to seizures and death. Tobacco contains a number of other unsafe chemicals. These chemicals are especially harmful when inhaled as smoke and can damage almost every organ in the body. Smokers live shorter lives than nonsmokers and are at risk of dying from a number of diseases and cancers. Tobacco smoke can also cause health problems for nonsmokers (due to inhaling secondhand smoke). Smoking is also a fire hazard. TUD usually starts in the late teenage years and is most common in young adults between the ages of 18 and 25 years. People who start smoking earlier in life are more likely to continue smoking as adults. TUD is somewhat more common in men than women. People with TUD are at higher risk for using alcohol and other drugs of abuse. What increases the risk? Risk factors for TUD include:  Having family members with the disorder.  Being around people who use tobacco.  Having an existing mental health issue such as schizophrenia, depression, bipolar disorder, ADHD, or posttraumatic stress disorder (PTSD).  What are the signs or symptoms? People with  tobacco use disorder have two or more of the following signs and symptoms within 12 months:  Use of more tobacco over a longer period than intended.  Not able to cut down or control tobacco use.  A lot of time spent obtaining or using tobacco.  Strong desire or urge to use tobacco (craving). Cravings may last for 6 months or longer after quitting.  Use of tobacco even when use leads to major problems at work, school, or home.  Use of tobacco even when use leads to relationship problems.  Giving up or cutting down on important life activities because of tobacco use.  Repeatedly using tobacco in situations where it puts you or others in physical danger, like smoking in bed.  Use of tobacco even when it is known that a physical or mental problem is likely related to tobacco use. ? Physical problems are numerous and may include chronic bronchitis, emphysema, lung and other cancers, gum disease, high blood pressure, heart disease, and stroke. ? Mental problems caused by tobacco may include difficulty sleeping and anxiety.  Need to use greater amounts of tobacco to get the same effect. This means you have developed a tolerance.  Withdrawal symptoms as a result of stopping or rapidly cutting back use. These symptoms may last a month or more after quitting and include the following: ? Depressed, anxious, or irritable mood. ? Difficulty concentrating. ? Increased appetite. ? Restlessness or trouble sleeping. ? Use of tobacco to avoid withdrawal symptoms.  How is this diagnosed? Tobacco use disorder is diagnosed by your health care provider. A diagnosis may be made by:    Your health care provider asking questions about your tobacco use and any problems it may be causing.  A physical exam.  Lab tests.  You may be referred to a mental health professional or addiction specialist.  The severity of tobacco use disorder depends on the number of signs and symptoms you have:  Mild-Two or  three symptoms.  Moderate-Four or five symptoms.  Severe-Six or more symptoms.  How is this treated? Many people with tobacco use disorder are unable to quit on their own and need help. Treatment options include the following:  Nicotine replacement therapy (NRT). NRT provides nicotine without the other harmful chemicals in tobacco. NRT gradually lowers the dosage of nicotine in the body and reduces withdrawal symptoms. NRT is available in over-the-counter forms (gum, lozenges, and skin patches) as well as prescription forms (mouth inhaler and nasal spray).  Medicines.This may include: ? Antidepressant medicine that may reduce nicotine cravings. ? A medicine that acts on nicotine receptors in the brain to reduce cravings and withdrawal symptoms. It may also block the effects of tobacco in people with TUD who relapse.  Counseling or talk therapy. A form of talk therapy called behavioral therapy is commonly used to treat people with TUD. Behavioral therapy looks at triggers for tobacco use, how to avoid them, and how to cope with cravings. It is most effective in person or by phone but is also available in self-help forms (books and Internet websites).  Support groups. These provide emotional support, advice, and guidance for quitting tobacco.  The most effective treatment for TUD is usually a combination of medicine, talk therapy, and support groups. Follow these instructions at home:  Keep all follow-up visits as directed by your health care provider. This is important.  Take medicines only as directed by your health care provider.  Check with your health care provider before starting new prescription or over-the-counter medicines. Contact a health care provider if:  You are not able to take your medicines as prescribed.  Treatment is not helping your TUD and your symptoms get worse. Get help right away if:  You have serious thoughts about hurting yourself or others.  You have  trouble breathing, chest pain, sudden weakness, or sudden numbness in part of your body. This information is not intended to replace advice given to you by your health care provider. Make sure you discuss any questions you have with your health care provider. Document Released: 01/11/2004 Document Revised: 01/08/2016 Document Reviewed: 07/03/2013 Elsevier Interactive Patient Education  2018 [employer]. Living With Depression Everyone experiences occasional disappointment, sadness, and loss in their lives. When you are feeling down, blue, or sad for at least 2 weeks in a row, it may mean that you have depression. Depression can affect your thoughts and feelings, relationships, daily activities, and physical health. It is caused by changes in the way your brain functions. If you receive a diagnosis of depression, your health care provider will tell you which type of depression you have and what treatment options are available to you. If you are living with depression, there are ways to help you recover from it and also ways to prevent it from coming back. How to cope with lifestyle changes Coping with stress Stress is your body's reaction to life changes and events, both good and bad. Stressful situations may include:  Getting married.  The death of a spouse.  Losing a job.  Retiring.  Having a baby.  Stress can last just a few hours or it  can be ongoing. Stress can play a major role in depression, so it is important to learn both how to cope with stress and how to think about it differently. Talk with your health care provider or a counselor if you would like to learn more about stress reduction. He or she may suggest some stress reduction techniques, such as:  Music therapy. This can include creating music or listening to music. Choose music that you enjoy and that inspires you.  Mindfulness-based meditation. This kind of meditation can be done while sitting or walking. It involves being  aware of your normal breaths, rather than trying to control your breathing.  Centering prayer. This is a kind of meditation that involves focusing on a spiritual word or phrase. Choose a word, phrase, or sacred image that is meaningful to you and that brings you peace.  Deep breathing. To do this, expand your stomach and inhale slowly through your nose. Hold your breath for 3-5 seconds, then exhale slowly, allowing your stomach muscles to relax.  Muscle relaxation. This involves intentionally tensing muscles then relaxing them.  Choose a stress reduction technique that fits your lifestyle and personality. Stress reduction techniques take time and practice to develop. Set aside 5-15 minutes a day to do them. Therapists can offer training in these techniques. The training may be covered by some insurance plans. Other things you can do to manage stress include:  Keeping a stress diary. This can help you learn what triggers your stress and ways to control your response.  Understanding what your limits are and saying no to requests or events that lead to a schedule that is too full.  Thinking about how you respond to certain situations. You may not be able to control everything, but you can control how you react.  Adding humor to your life by watching funny films or TV shows.  Making time for activities that help you relax and not feeling guilty about spending your time this way.  Medicines Your health care provider may suggest certain medicines if he or she feels that they will help improve your condition. Avoid using alcohol and other substances that may prevent your medicines from working properly (may interact). It is also important to:  Talk with your pharmacist or health care provider about all the medicines that you take, their possible side effects, and what medicines are safe to take together.  Make it your goal to take part in all treatment decisions (shared decision-making). This  includes giving input on the side effects of medicines. It is best if shared decision-making with your health care provider is part of your total treatment plan.  If your health care provider prescribes a medicine, you may not notice the full benefits of it for 4-8 weeks. Most people who are treated for depression need to be on medicine for at least 6-12 months after they feel better. If you are taking medicines as part of your treatment, do not stop taking medicines without first talking to your health care provider. You may need to have the medicine slowly decreased (tapered) over time to decrease the risk of harmful side effects. Relationships Your health care provider may suggest family therapy along with individual therapy and drug therapy. While there may not be family problems that are causing you to feel depressed, it is still important to make sure your family learns as much as they can about your mental health. Having your family's support can help make your treatment successful. How to  recognize changes in your condition Everyone has a different response to treatment for depression. Recovery from major depression happens when you have not had signs of major depression for two months. This may mean that you will start to:  Have more interest in doing activities.  Feel less hopeless than you did 2 months ago.  Have more energy.  Overeat less often, or have better or improving appetite.  Have better concentration.  Your health care provider will work with you to decide the next steps in your recovery. It is also important to recognize when your condition is getting worse. Watch for these signs:  Having fatigue or low energy.  Eating too much or too little.  Sleeping too much or too little.  Feeling restless, agitated, or hopeless.  Having trouble concentrating or making decisions.  Having unexplained physical complaints.  Feeling irritable, angry, or aggressive.  Get help as  soon as you or your family members notice these symptoms coming back. How to get support and help from others How to talk with friends and family members about your condition Talking to friends and family members about your condition can provide you with one way to get support and guidance. Reach out to trusted friends or family members, explain your symptoms to them, and let them know that you are working with a health care provider to treat your depression. Financial resources Not all insurance plans cover mental health care, so it is important to check with your insurance carrier. If paying for co-pays or counseling services is a problem, search for a local or county mental health care center. They may be able to offer public mental health care services at low or no cost when you are not able to see a private health care provider. If you are taking medicine for depression, you may be able to get the generic form, which may be less expensive. Some makers of prescription medicines also offer help to patients who cannot afford the medicines they need. Follow these instructions at home:  Get the right amount and quality of sleep.  Cut down on using caffeine, tobacco, alcohol, and other potentially harmful substances.  Try to exercise, such as walking or lifting small weights.  Take over-the-counter and prescription medicines only as told by your health care provider.  Eat a healthy diet that includes plenty of vegetables, fruits, whole grains, low-fat dairy products, and lean protein. Do not eat a lot of foods that are high in solid fats, added sugars, or salt.  Keep all follow-up visits as told by your health care provider. This is important. Contact a health care provider if:  You stop taking your antidepressant medicines, and you have any of these symptoms: ? Nausea. ? Headache. ? Feeling lightheaded. ? Chills and body aches. ? Not being able to sleep (insomnia).  You or your friends  and family think your depression is getting worse. Get help right away if:  You have thoughts of hurting yourself or others. If you ever feel like you may hurt yourself or others, or have thoughts about taking your own life, get help right away. You can go to your nearest emergency department or call:  Your local emergency services (911 in the [place]  A suicide crisis helpline, such as the National Suicide Prevention Lifeline at (775) 747-05011-336-497-1988. This is open 24-hours a day.  Summary  If you are living with depression, there are ways to help you recover from it and also ways to prevent it from  coming back.  Work with your health care team to create a management plan that includes counseling, stress management techniques, and healthy lifestyle habits. This information is not intended to replace advice given to you by your health care provider. Make sure you discuss any questions you have with your health care provider. Document Released: 04/09/2016 Document Revised: 04/09/2016 Document Reviewed: 04/09/2016 Elsevier Interactive Patient Education  [employer].

## 2017-12-05 NOTE — Progress Notes (Signed)
Subjective   Leonard Ramos 62 y.o. male  [id number]  [id number]  1956-02-12    Chief Complaint  Patient presents with  . Follow-up  . Anxiety  . Medication Refill    refill all meds.     Patient presents for 3 month follow up. Patient states that he is working [employer]  through the [employer] card. Patient states that his medicaid has been denied and he is in the appeal process.  Patient with a hx of GAD and MDD. States that he is taking buspar and prozac with relief. Denies psychotherapy. Patient denies SI, HI, AH, VH. States that his depression revolves around lack of finances.  Patient with left sided weakness and neuropathy. Is currently taking gabapentin with relief.    Review of Systems  Constitutional: Negative.   HENT:       Poor dentition   Eyes: Negative.   Respiratory: Negative.   Cardiovascular: Negative.   Gastrointestinal: Negative.   Musculoskeletal: Negative.   Neurological: Positive for tingling (neuropathy).  Psychiatric/Behavioral: Positive for depression. Negative for suicidal ideas. The patient is nervous/anxious. The patient does not have insomnia.     Objective   Physical Exam  Constitutional: He is oriented to person, place, and time. He appears well-developed and well-nourished.  HENT:  Head: Normocephalic and atraumatic.  Right Ear: External ear normal.  Left Ear: External ear normal.  Nose: Nose normal.  Mouth/Throat: Oropharynx is clear and moist.  Eyes: Pupils are equal, round, and reactive to light. Conjunctivae and EOM are normal.  Neck: Normal range of motion. Neck supple. No tracheal deviation present. No thyromegaly present.  Cardiovascular: Normal rate, regular rhythm, normal heart sounds and intact distal pulses. Exam reveals no friction rub.  No murmur heard. Pulmonary/Chest: Effort normal and breath sounds normal. No stridor. No respiratory distress. He has no wheezes.  Abdominal: Soft. Bowel sounds are normal. He  exhibits no distension and no mass. There is no tenderness.  Musculoskeletal: Normal range of motion.  Lymphadenopathy:    He has no cervical adenopathy.  Neurological: He is alert and oriented to person, place, and time.  Skin: Skin is warm and dry.  Psychiatric: He has a normal mood and affect. His behavior is normal. Judgment and thought content normal.  Nursing note and vitals reviewed.   BP 116/81 (BP Location: Left Arm, Patient Position: Sitting, Cuff Size: Normal)   Pulse 86   Temp 98 F (36.7 C) (Oral)   Resp 16   Ht 5\' 11"  (1.803 m)   Wt 165 lb (74.8 kg)   SpO2 98%   BMI 23.01 kg/m   Assessment   Encounter Diagnoses  Name Primary?  . Generalized anxiety disorder   . Depression, unspecified depression type   . ETOH abuse   . Neuropathy   . Left sided numbness   . Aortic atherosclerosis (HCC)   . Tobacco dependence Yes  . Left-sided weakness   . Chronic dental pain      Plan  1. Generalized anxiety disorder - busPIRone (BUSPAR) 10 MG tablet; Take 1 tablet (10 mg total) by mouth 2 (two) times daily.  Dispense: 60 tablet; Refill: 5 - FLUoxetine (PROZAC) 20 MG tablet; Take 1 tablet (20 mg total) by mouth daily.  Dispense: 30 tablet; Refill: 5 - TSH  2. Depression, unspecified depression type - busPIRone (BUSPAR) 10 MG tablet; Take 1 tablet (10 mg total) by mouth 2 (two) times daily.  Dispense: 60 tablet; Refill: 5 - FLUoxetine (PROZAC) 20  MG tablet; Take 1 tablet (20 mg total) by mouth daily.  Dispense: 30 tablet; Refill: 5 - TSH  3. ETOH abuse  - folic acid (FOLVITE) 1 MG tablet; Take 1 tablet (1 mg total) by mouth daily.  Dispense: 30 tablet; Refill: 6  4. Neuropathy Continue with current medications  - gabapentin (NEURONTIN) 400 MG capsule; Take 1 capsule (400 mg total) by mouth 4 (four) times daily.  Dispense: 120 capsule; Refill: 3  5. Left sided numbness  - gabapentin (NEURONTIN) 400 MG capsule; Take 1 capsule (400 mg total) by mouth 4 (four) times  daily.  Dispense: 120 capsule; Refill: 3  6. Aortic atherosclerosis (HCC) Continue with current medications  - pravastatin (PRAVACHOL) 20 MG tablet; Take 1 tablet (20 mg total) by mouth daily.  Dispense: 90 tablet; Refill: 1 - Lipid Panel - CBC with Differential - Comprehensive metabolic panel  7. Tobacco dependence 1/2 ppd. Not interested in quitting.   8. Left-sided weakness Continue with current medications   Referral to dentistry for further evaluation.  This note has been created with Education officer, [employer] speech recognition software and smart phrase technology. Any transcriptional errors are unintentional.

## 2017-12-06 LAB — CBC WITH DIFFERENTIAL/PLATELET
Basophils Absolute: 0.1 10*3/uL (ref 0.0–0.2)
Basos: 1 %
EOS (ABSOLUTE): 0.1 10*3/uL (ref 0.0–0.4)
Eos: 2 %
Hematocrit: 47.9 % (ref 37.5–51.0)
Hemoglobin: 16.5 g/dL (ref 13.0–17.7)
Immature Grans (Abs): 0 10*3/uL (ref 0.0–0.1)
Immature Granulocytes: 0 %
Lymphocytes Absolute: 1.6 10*3/uL (ref 0.7–3.1)
Lymphs: 37 %
MCH: 31.4 pg (ref 26.6–33.0)
MCHC: 34.4 g/dL (ref 31.5–35.7)
MCV: 91 fL (ref 79–97)
Monocytes Absolute: 0.6 10*3/uL (ref 0.1–0.9)
Monocytes: 14 %
Neutrophils Absolute: 2 10*3/uL (ref 1.4–7.0)
Neutrophils: 46 %
Platelets: 202 10*3/uL (ref 150–450)
RBC: 5.25 x10E6/uL (ref 4.14–5.80)
RDW: 15.2 % (ref 12.3–15.4)
WBC: 4.3 10*3/uL (ref 3.4–10.8)

## 2017-12-06 LAB — COMPREHENSIVE METABOLIC PANEL
ALT: 43 IU/L (ref 0–44)
AST: 45 IU/L — ABNORMAL HIGH (ref 0–40)
Albumin/Globulin Ratio: 1.7 (ref 1.2–2.2)
Albumin: 4.3 g/dL (ref 3.6–4.8)
Alkaline Phosphatase: 71 IU/L (ref 39–117)
BUN/Creatinine Ratio: 7 — ABNORMAL LOW (ref 10–24)
BUN: 7 mg/dL — ABNORMAL LOW (ref 8–27)
Bilirubin Total: 0.3 mg/dL (ref 0.0–1.2)
CO2: 21 mmol/L (ref 20–29)
Calcium: 9.5 mg/dL (ref 8.6–10.2)
Chloride: 103 mmol/L (ref 96–106)
Creatinine, Ser: 0.95 mg/dL (ref 0.76–1.27)
GFR calc Af Amer: 100 mL/min/{1.73_m2} (ref >59)
GFR calc non Af Amer: 87 mL/min/{1.73_m2} (ref >59)
Globulin, Total: 2.5 g/dL (ref 1.5–4.5)
Glucose: 76 mg/dL (ref 65–99)
Potassium: 4.6 mmol/L (ref 3.5–5.2)
Sodium: 141 mmol/L (ref 134–144)
Total Protein: 6.8 g/dL (ref 6.0–8.5)

## 2017-12-06 LAB — LIPID PANEL
Chol/HDL Ratio: 3 ratio (ref 0.0–5.0)
Cholesterol, Total: 176 mg/dL (ref 100–199)
HDL: 58 mg/dL (ref >39)
LDL Calculated: 100 mg/dL — ABNORMAL HIGH (ref 0–99)
Triglycerides: 89 mg/dL (ref 0–149)
VLDL Cholesterol Cal: 18 mg/dL (ref 5–40)

## 2017-12-06 LAB — TSH: TSH: 0.999 u[IU]/mL (ref 0.450–4.500)

## 2017-12-13 ENCOUNTER — Telehealth: Payer: Self-pay | Admitting: Family Medicine

## 2017-12-13 NOTE — Telephone Encounter (Signed)
Pt requests referral to dental practice; states that he has an [employer] card for insurance

## 2017-12-13 NOTE — Telephone Encounter (Signed)
Referral has been put in by provider. Thanks!

## 2018-03-07 ENCOUNTER — Encounter: Payer: Self-pay | Admitting: Family Medicine

## 2018-03-07 ENCOUNTER — Ambulatory Visit (INDEPENDENT_AMBULATORY_CARE_PROVIDER_SITE_OTHER): Payer: Self-pay | Admitting: Family Medicine

## 2018-03-07 ENCOUNTER — Other Ambulatory Visit: Payer: Self-pay

## 2018-03-07 VITALS — BP 117/83 | HR 91 | Temp 98.2°F | Ht 71.5 in | Wt 166.0 lb

## 2018-03-07 DIAGNOSIS — F1010 Alcohol abuse, uncomplicated: Secondary | ICD-10-CM

## 2018-03-07 DIAGNOSIS — K089 Disorder of teeth and supporting structures, unspecified: Secondary | ICD-10-CM

## 2018-03-07 DIAGNOSIS — F32A Depression, unspecified: Secondary | ICD-10-CM

## 2018-03-07 DIAGNOSIS — Z23 Encounter for immunization: Secondary | ICD-10-CM

## 2018-03-07 DIAGNOSIS — G629 Polyneuropathy, unspecified: Secondary | ICD-10-CM

## 2018-03-07 DIAGNOSIS — I700 Atherosclerosis of aorta: Secondary | ICD-10-CM

## 2018-03-07 DIAGNOSIS — G4700 Insomnia, unspecified: Secondary | ICD-10-CM

## 2018-03-07 DIAGNOSIS — F329 Major depressive disorder, single episode, unspecified: Secondary | ICD-10-CM

## 2018-03-07 DIAGNOSIS — R200 Anesthesia of skin: Secondary | ICD-10-CM

## 2018-03-07 DIAGNOSIS — F411 Generalized anxiety disorder: Secondary | ICD-10-CM

## 2018-03-07 MED ORDER — AMOXICILLIN-POT CLAVULANATE 875-125 MG PO TABS: 1.0000 | ORAL_TABLET | Freq: Two times a day (BID) | ORAL | 0 refills | Status: AC

## 2018-03-07 MED ORDER — GABAPENTIN 400 MG PO CAPS: 400.0000 mg | ORAL_CAPSULE | Freq: Four times a day (QID) | ORAL | 3 refills | Status: DC

## 2018-03-07 MED ORDER — VITAMIN B-1 100 MG PO TABS: 100.0000 mg | ORAL_TABLET | Freq: Every day | ORAL | 6 refills | Status: DC

## 2018-03-07 MED ORDER — PRAVASTATIN SODIUM 20 MG PO TABS: 20.0000 mg | ORAL_TABLET | Freq: Every day | ORAL | 1 refills | Status: DC

## 2018-03-07 MED ORDER — FOLIC ACID 1 MG PO TABS: 1.0000 mg | ORAL_TABLET | Freq: Every day | ORAL | 6 refills | Status: DC

## 2018-03-07 MED ORDER — TRAZODONE HCL 50 MG PO TABS: 50.0000 mg | ORAL_TABLET | Freq: Every evening | ORAL | 2 refills | Status: DC | PRN

## 2018-03-07 MED ORDER — FLUOXETINE HCL 40 MG PO CAPS: 40.0000 mg | ORAL_CAPSULE | Freq: Every day | ORAL | 3 refills | Status: DC

## 2018-03-07 MED FILL — traZODone HCL 50 MG TABS: 50 | 30 days supply | Qty: 30 | Fill #0

## 2018-03-07 MED FILL — GABAPENTIN 400 MG CAPSULE: 400 | 30 days supply | Qty: 120 | Fill #0

## 2018-03-07 MED FILL — AMOX-CLAV 875-125 MG TABLET: 875-125 | 7 days supply | Qty: 14 | Fill #0

## 2018-03-07 MED FILL — FOLIC ACID 1 MG TABS: 1 | 30 days supply | Qty: 30 | Fill #0

## 2018-03-07 MED FILL — FLUoxetine HCL 40 MG CAPS: 40 | 30 days supply | Qty: 30 | Fill #0

## 2018-03-07 MED FILL — PRAVASTATIN SODIUM 20 MG TA: 20 | 30 days supply | Qty: 30 | Fill #0

## 2018-03-07 NOTE — Progress Notes (Signed)
[facility]  Provider: Mike Gip, FNP   Hypertension Follow Up Visit  SUBJECTIVE:  Leonard Ramos is a 62 y.o. male who  has a past medical history of Alcohol use and Neuropathy. .   New concerns: Patient states that he is having dental pain and needs referral to dentist. Has orange card. He states that he is not sleeping well and his depression continues. He denies SI, HI, AH, VH. Patient states that he continues to have neuropathy. He did not pick up his medications after the last visit but denies missing any doses.    Current Outpatient Medications  Medication Sig Dispense Refill  . aspirin 81 MG tablet Take 243-324 mg by mouth daily as needed for pain.    . busPIRone (BUSPAR) 10 MG tablet Take 1 tablet (10 mg total) by mouth 2 (two) times daily. 60 tablet 5  . FLUoxetine (PROZAC) 20 MG tablet Take 1 tablet (20 mg total) by mouth daily. 30 tablet 5  . folic acid (FOLVITE) 1 MG tablet Take 1 tablet (1 mg total) by mouth daily. 30 tablet 6  . gabapentin (NEURONTIN) 400 MG capsule Take 1 capsule (400 mg total) by mouth 4 (four) times daily. 120 capsule 3  . Multiple Vitamin (MULTIVITAMIN WITH MINERALS) TABS tablet Take 1 tablet by mouth daily. 30 tablet 5  . pravastatin (PRAVACHOL) 20 MG tablet Take 1 tablet (20 mg total) by mouth daily. 90 tablet 1  . thiamine (VITAMIN B-1) 100 MG tablet Take 1 tablet (100 mg total) by mouth daily. 30 tablet 6  . cetirizine (ZYRTEC ALLERGY) 10 MG tablet Take 1 tablet (10 mg total) by mouth daily. (Patient not taking: Reported on 03/07/2018) 30 tablet 5   No current facility-administered medications for this visit.     No results found for this or any previous visit (from the past 2160 hour(s)).  Hypertension ROS: Review of Systems  Constitutional: Negative.   HENT: Negative.        Poor dentition  Eyes: Negative.   Respiratory: Negative.   Cardiovascular: Negative.   Gastrointestinal:  Negative.   Genitourinary: Negative.   Musculoskeletal: Negative.   Skin: Negative.   Neurological: Negative.   Psychiatric/Behavioral: Negative.      OBJECTIVE:   BP 117/83   Pulse 91   Temp 98.2 F (36.8 C) (Oral)   Ht 5' 11.5" (1.816 m)   Wt 166 lb (75.3 kg)   SpO2 97%   BMI 22.83 kg/m   Physical Exam  Constitutional: He is oriented to person, place, and time and well-developed, well-nourished, and in no distress. No distress.  HENT:  Head: Normocephalic and atraumatic.  Mouth/Throat: Dental abscesses and dental caries present.  Eyes: Pupils are equal, round, and reactive to light. Conjunctivae and EOM are normal.  Neck: Normal range of motion. Neck supple.  Cardiovascular: Normal rate, regular rhythm and intact distal pulses. Exam reveals no gallop and no friction rub.  No murmur heard. Pulmonary/Chest: Effort normal and breath sounds normal. No respiratory distress. He has no wheezes.  Abdominal: Soft. Bowel sounds are normal. There is no tenderness.  Musculoskeletal: Normal range of motion. He exhibits no edema or tenderness.  Lymphadenopathy:    He has no cervical adenopathy.  Neurological: He is alert and oriented to person, place, and time. Gait normal.  Skin: Skin is warm and dry.  Psychiatric: Mood, memory, affect and judgment normal.  Nursing note and vitals reviewed.    ASSESSMENT/PLAN:  1. Need for immunization against influenza - Flu Vaccine QUAD 36+ mos IM  2. Poor dentition - Ambulatory referral to Dentistry - amoxicillin-clavulanate (AUGMENTIN) 875-125 MG tablet; Take 1 tablet by mouth every 12 (twelve) hours for 7 days.  Dispense: 14 tablet; Refill: 0  3. Neuropathy - Comprehensive metabolic panel - gabapentin (NEURONTIN) 400 MG capsule; Take 1 capsule (400 mg total) by mouth 4 (four) times daily.  Dispense: 120 capsule; Refill: 3 - FLUoxetine (PROZAC) 40 MG capsule; Take 1 capsule (40 mg total) by mouth daily.  Dispense: 90 capsule; Refill:  3  4. Left sided numbness - gabapentin (NEURONTIN) 400 MG capsule; Take 1 capsule (400 mg total) by mouth 4 (four) times daily.  Dispense: 120 capsule; Refill: 3  5. ETOH abuse - thiamine (VITAMIN B-1) 100 MG tablet; Take 1 tablet (100 mg total) by mouth daily.  Dispense: 30 tablet; Refill: 6 - folic acid (FOLVITE) 1 MG tablet; Take 1 tablet (1 mg total) by mouth daily.  Dispense: 30 tablet; Refill: 6  6. Aortic atherosclerosis (HCC) - pravastatin (PRAVACHOL) 20 MG tablet; Take 1 tablet (20 mg total) by mouth daily.  Dispense: 90 tablet; Refill: 1  7. Generalized anxiety disorder Increased prozac.   8. Depression, unspecified depression type Increased prozac.  - FLUoxetine (PROZAC) 40 MG capsule; Take 1 capsule (40 mg total) by mouth daily.  Dispense: 90 capsule; Refill: 3  9. Insomnia, unspecified type Start trazodone. Sleep hygiene  - traZODone (DESYREL) 50 MG tablet; Take 1 tablet (50 mg total) by mouth at bedtime as needed for sleep.  Dispense: 30 tablet; Refill: 2    The patient is asked to make an attempt to improve diet and exercise patterns to aid in medical management of this problem.  Return to care as scheduled and prn. Patient verbalized understanding and agreed with plan of care.    Ms. Freda Jackson. Riley Lam, FNP-BC [facility] [address] [place], [place] [address] [phone]

## 2018-03-07 NOTE — Patient Instructions (Signed)
Trazodone tablets  What is this medicine?  TRAZODONE (TRAZ oh done) is used to treat depression.  This medicine may be used for other purposes; ask your health care provider or pharmacist if you have questions.  COMMON BRAND NAME(S): Desyrel  What should I tell my health care provider before I take this medicine?  They need to know if you have any of these conditions:  -attempted suicide or thinking about it  -bipolar disorder  -bleeding problems  -glaucoma  -heart disease, or previous heart attack  -irregular heart beat  -kidney or liver disease  -low levels of sodium in the blood  -an unusual or allergic reaction to trazodone, other medicines, foods, dyes or preservatives  -pregnant or trying to get pregnant  -breast-feeding  How should I use this medicine?  Take this medicine by mouth with a glass of water. Follow the directions on the prescription label. Take this medicine shortly after a meal or a light snack. Take your medicine at regular intervals. Do not take your medicine more often than directed. Do not stop taking this medicine suddenly except upon the advice of your doctor. Stopping this medicine too quickly may cause serious side effects or your condition may worsen.  A special MedGuide will be given to you by the pharmacist with each prescription and refill. Be sure to read this information carefully each time.  Talk to your pediatrician regarding the use of this medicine in children. Special care may be needed.  Overdosage: If you think you have taken too much of this medicine contact a poison control center or emergency room at once.  NOTE: This medicine is only for you. Do not share this medicine with others.  What if I miss a dose?  If you miss a dose, take it as soon as you can. If it is almost time for your next dose, take only that dose. Do not take double or extra doses.  What may interact with this medicine?  Do not take this medicine with any of the following medications:  -certain medicines  for fungal infections like fluconazole, itraconazole, ketoconazole, posaconazole, voriconazole  -cisapride  -dofetilide  -dronedarone  -linezolid  -MAOIs like Carbex, Eldepryl, Marplan, Nardil, and Parnate  -mesoridazine  -methylene blue (injected into a vein)  -pimozide  -saquinavir  -thioridazine  -ziprasidone  This medicine may also interact with the following medications:  -alcohol  -antiviral medicines for HIV or AIDS  -aspirin and aspirin-like medicines  -barbiturates like phenobarbital  -certain medicines for blood pressure, heart disease, irregular heart beat  -certain medicines for depression, anxiety, or psychotic disturbances  -certain medicines for migraine headache like almotriptan, eletriptan, frovatriptan, naratriptan, rizatriptan, sumatriptan, zolmitriptan  -certain medicines for seizures like carbamazepine and phenytoin  -certain medicines for sleep  -certain medicines that treat or prevent blood clots like dalteparin, enoxaparin, warfarin  -digoxin  -fentanyl  -lithium  -NSAIDS, medicines for pain and inflammation, like ibuprofen or naproxen  -other medicines that prolong the QT interval (cause an abnormal heart rhythm)  -rasagiline  -supplements like St. John's wort, kava kava, valerian  -tramadol  -tryptophan  This list may not describe all possible interactions. Give your health care provider a list of all the medicines, herbs, non-prescription drugs, or dietary supplements you use. Also tell them if you smoke, drink alcohol, or use illegal drugs. Some items may interact with your medicine.  What should I watch for while using this medicine?  Tell your doctor if your symptoms do not get better   or if they get worse. Visit your doctor or health care professional for regular checks on your progress. Because it may take several weeks to see the full effects of this medicine, it is important to continue your treatment as prescribed by your doctor.  Patients and their families should watch out for new  or worsening thoughts of suicide or depression. Also watch out for sudden changes in feelings such as feeling anxious, agitated, panicky, irritable, hostile, aggressive, impulsive, severely restless, overly excited and hyperactive, or not being able to sleep. If this happens, especially at the beginning of treatment or after a change in dose, call your health care professional.  You may get drowsy or dizzy. Do not drive, use machinery, or do anything that needs mental alertness until you know how this medicine affects you. Do not stand or sit up quickly, especially if you are an older patient. This reduces the risk of dizzy or fainting spells. Alcohol may interfere with the effect of this medicine. Avoid alcoholic drinks.  This medicine may cause dry eyes and blurred vision. If you wear contact lenses you may feel some discomfort. Lubricating drops may help. See your eye doctor if the problem does not go away or is severe.  Your mouth may get dry. Chewing sugarless gum, sucking hard candy and drinking plenty of water may help. Contact your doctor if the problem does not go away or is severe.  What side effects may I notice from receiving this medicine?  Side effects that you should report to your doctor or health care professional as soon as possible:  -allergic reactions like skin rash, itching or hives, swelling of the face, lips, or tongue  -elevated mood, decreased need for sleep, racing thoughts, impulsive behavior  -confusion  -fast, irregular heartbeat  -feeling faint or lightheaded, falls  -feeling agitated, angry, or irritable  -loss of balance or coordination  -painful or prolonged erections  -restlessness, pacing, inability to keep still  -suicidal thoughts or other mood changes  -tremors  -trouble sleeping  -seizures  -unusual bleeding or bruising  Side effects that usually do not require medical attention (report to your doctor or health care professional if they continue or are bothersome):  -change in  sex drive or performance  -change in appetite or weight  -constipation  -headache  -muscle aches or pains  -nausea  This list may not describe all possible side effects. Call your doctor for medical advice about side effects. You may report side effects to FDA at 1-800-FDA-1088.  Where should I keep my medicine?  Keep out of the reach of children.  Store at room temperature between 15 and 30 degrees C (59 to 86 degrees F). Protect from light. Keep container tightly closed. Throw away any unused medicine after the expiration date.  NOTE: This sheet is a summary. It may not cover all possible information. If you have questions about this medicine, talk to your doctor, pharmacist, or health care provider.   2018 Elsevier/Gold Standard (2015-10-06 16:57:05)     Insomnia  Insomnia is a sleep disorder that makes it difficult to fall asleep or to stay asleep. Insomnia can cause tiredness (fatigue), low energy, difficulty concentrating, mood swings, and poor performance at work or school.  There are three different ways to classify insomnia:   Difficulty falling asleep.   Difficulty staying asleep.   Waking up too early in the morning.    Any type of insomnia can be long-term (chronic) or short-term (acute). Both are   common. Short-term insomnia usually lasts for three months or less. Chronic insomnia occurs at least three times a week for longer than three months.  What are the causes?  Insomnia may be caused by another condition, situation, or substance, such as:   Anxiety.   Certain medicines.   Gastroesophageal reflux disease (GERD) or other gastrointestinal conditions.   Asthma or other breathing conditions.   Restless legs syndrome, sleep apnea, or other sleep disorders.   Chronic pain.   Menopause. This may include hot flashes.   Stroke.   Abuse of alcohol, tobacco, or illegal drugs.   Depression.   Caffeine.   Neurological disorders, such as Alzheimer disease.   An overactive thyroid  (hyperthyroidism).    The cause of insomnia may not be known.  What increases the risk?  Risk factors for insomnia include:   Gender. Women are more commonly affected than men.   Age. Insomnia is more common as you get older.   Stress. This may involve your professional or personal life.   Income. Insomnia is more common in people with lower income.   Lack of exercise.   Irregular work schedule or night shifts.   Traveling between different time zones.    What are the signs or symptoms?  If you have insomnia, trouble falling asleep or trouble staying asleep is the main symptom. This may lead to other symptoms, such as:   Feeling fatigued.   Feeling nervous about going to sleep.   Not feeling rested in the morning.   Having trouble concentrating.   Feeling irritable, anxious, or depressed.    How is this treated?  Treatment for insomnia depends on the cause. If your insomnia is caused by an underlying condition, treatment will focus on addressing the condition. Treatment may also include:   Medicines to help you sleep.   Counseling or therapy.   Lifestyle adjustments.    Follow these instructions at home:   Take medicines only as directed by your health care provider.   Keep regular sleeping and waking hours. Avoid naps.   Keep a sleep diary to help you and your health care provider figure out what could be causing your insomnia. Include:  ? When you sleep.  ? When you wake up during the night.  ? How well you sleep.  ? How rested you feel the next day.  ? Any side effects of medicines you are taking.  ? What you eat and drink.   Make your bedroom a comfortable place where it is easy to fall asleep:  ? Put up shades or special blackout curtains to block light from outside.  ? Use a white noise machine to block noise.  ? Keep the temperature cool.   Exercise regularly as directed by your health care provider. Avoid exercising right before bedtime.   Use relaxation techniques to manage stress. Ask  your health care provider to suggest some techniques that may work well for you. These may include:  ? Breathing exercises.  ? Routines to release muscle tension.  ? Visualizing peaceful scenes.   Cut back on alcohol, caffeinated beverages, and cigarettes, especially close to bedtime. These can disrupt your sleep.   Do not overeat or eat spicy foods right before bedtime. This can lead to digestive discomfort that can make it hard for you to sleep.   Limit screen use before bedtime. This includes:  ? Watching TV.  ? Using your smartphone, tablet, and computer.   Stick to a routine.   This can help you fall asleep faster. Try to do a quiet activity, brush your teeth, and go to bed at the same time each night.   Get out of bed if you are still awake after 15 minutes of trying to sleep. Keep the lights down, but try reading or doing a quiet activity. When you feel sleepy, go back to bed.   Make sure that you drive carefully. Avoid driving if you feel very sleepy.   Keep all follow-up appointments as directed by your health care provider. This is important.  Contact a health care provider if:   You are tired throughout the day or have trouble in your daily routine due to sleepiness.   You continue to have sleep problems or your sleep problems get worse.  Get help right away if:   You have serious thoughts about hurting yourself or someone else.  This information is not intended to replace advice given to you by your health care provider. Make sure you discuss any questions you have with your health care provider.  Document Released: 05/04/2000 Document Revised: 10/07/2015 Document Reviewed: 02/05/2014  Elsevier Interactive Patient Education  2018 Elsevier Inc.

## 2018-03-08 LAB — COMPREHENSIVE METABOLIC PANEL
ALT: 37 IU/L (ref 0–44)
AST: 47 IU/L — ABNORMAL HIGH (ref 0–40)
Albumin/Globulin Ratio: 1.7 (ref 1.2–2.2)
Albumin: 4.7 g/dL (ref 3.6–4.8)
Alkaline Phosphatase: 75 IU/L (ref 39–117)
BUN/Creatinine Ratio: 11 (ref 10–24)
BUN: 12 mg/dL (ref 8–27)
Bilirubin Total: 0.5 mg/dL (ref 0.0–1.2)
CO2: 22 mmol/L (ref 20–29)
Calcium: 9.9 mg/dL (ref 8.6–10.2)
Chloride: 101 mmol/L (ref 96–106)
Creatinine, Ser: 1.05 mg/dL (ref 0.76–1.27)
GFR calc Af Amer: 89 mL/min/{1.73_m2} (ref >59)
GFR calc non Af Amer: 77 mL/min/{1.73_m2} (ref >59)
Globulin, Total: 2.7 g/dL (ref 1.5–4.5)
Glucose: 84 mg/dL (ref 65–99)
Potassium: 5.2 mmol/L (ref 3.5–5.2)
Sodium: 141 mmol/L (ref 134–144)
Total Protein: 7.4 g/dL (ref 6.0–8.5)

## 2018-04-04 ENCOUNTER — Ambulatory Visit (INDEPENDENT_AMBULATORY_CARE_PROVIDER_SITE_OTHER): Payer: Self-pay | Admitting: Family Medicine

## 2018-04-04 VITALS — BP 137/72 | HR 93 | Resp 16 | Ht 71.0 in | Wt 168.0 lb

## 2018-04-04 DIAGNOSIS — K089 Disorder of teeth and supporting structures, unspecified: Secondary | ICD-10-CM

## 2018-04-04 MED ORDER — CHLORHEXIDINE GLUCONATE 0.12% ORAL RINSE (MEDLINE KIT): 15.0000 mL | Freq: Every day | OROMUCOSAL | 2 refills | Status: DC

## 2018-04-04 MED ORDER — CHLORHEXIDINE GLUCONATE 0.12 % MT SOLN: 15.0000 mL | Freq: Every day | OROMUCOSAL | 2 refills | Status: DC

## 2018-04-04 MED ORDER — CEPHALEXIN 500 MG PO CAPS: 500.0000 mg | ORAL_CAPSULE | Freq: Two times a day (BID) | ORAL | 0 refills | Status: AC

## 2018-04-04 NOTE — Patient Instructions (Signed)
Dental Caries Dental caries are spots of decay (cavities) in teeth. They are in the outer layer of your tooth (enamel). Treat them as soon as you can. If they are not treated, they can spread decay and lead to painful infection. Follow these instructions at home: General instructions  Take good care of your mouth and teeth. This keeps them healthy. ? Brush your teeth 2 times a day. Use toothpaste with fluoride in it. ? Floss your teeth once a day.  If your dentist prescribed an antibiotic medicine to treat an infection, take it as told. Do not stop taking the antibiotic even if your condition gets better.  Keep all follow-up visits as told by your dentist. This is important. This includes all cleanings. Preventing dental caries  Brush your teeth every morning and night. Use fluoride toothpaste.  Get regular dental cleanings.  If you are at risk of dental caries. ? Wash your mouth with prescription mouthwash (chlorhexidine). ? Put topical fluoride on your teeth.  Drink water with fluoride in it.  Drink water instead of sugary drinks.  Eat healthy meals and snacks. Contact a doctor if:  You have symptoms of tooth decay. Summary  Dental caries are spots of decay (cavities) in teeth. They are in the outer layer of your tooth.  Take an antibiotic to treat an infection, if told by your dentist. Do not stop taking the antibiotic even if your condition gets better.  Regular dental cleanings and brushing can help prevent dental caries. This information is not intended to replace advice given to you by your health care provider. Make sure you discuss any questions you have with your health care provider. Document Released: 02/14/2008 Document Revised: 01/22/2016 Document Reviewed: 01/22/2016 Elsevier Interactive Patient Education  2017 Elsevier Inc.  

## 2018-04-04 NOTE — Progress Notes (Signed)
  [facility]   Progress Note: General Provider: Mike GipAndre Norman Bier, FNP  SUBJECTIVE:   Leonard Ramos is a 62 y.o. male who  has a past medical history of Alcohol use and Neuropathy.. Patient presents today for Follow-up (4 week follow up for dental pain )  Patient states that he has not been seen by dentistry. Has had several referrals placed since 2017. Patient reports not hearing from anyone regarding this.  Patient states that he continues to have dental pain and bad breath. His gums are swelling and bleed with brushing. Review of Systems  Constitutional: Negative.   HENT:       Poor dentition. Bleeding gums, Swelling of gums, bad breath.   Respiratory: Negative.   Cardiovascular: Negative.   Neurological: Negative.   Psychiatric/Behavioral: Negative.      OBJECTIVE: Ht 5\' 11"  (1.803 m)   Wt 168 lb (76.2 kg)   BMI 23.43 kg/m   Physical Exam  Constitutional: He is oriented to person, place, and time. He appears well-developed and well-nourished.  HENT:  Mouth/Throat: Oropharynx is clear and moist and mucous membranes are normal. Abnormal dentition. Dental caries present.    Eyes: Pupils are equal, round, and reactive to light. EOM are normal.  Neck: Normal range of motion.  Cardiovascular: Normal rate, regular rhythm and normal heart sounds.  Pulmonary/Chest: Effort normal and breath sounds normal. No respiratory distress.  Lymphadenopathy:    He has no cervical adenopathy.  Neurological: He is alert and oriented to person, place, and time.  Skin: Skin is warm and dry.  Psychiatric: He has a normal mood and affect. His behavior is normal. Judgment and thought content normal.  Nursing note and vitals reviewed.   ASSESSMENT/PLAN:   1. Poor dentition Patient given information on self-pay dentists. His orange card is inactive and he has an appointment to renew. Started on antibiotics and oral antiseptic mouth rinse for  suspected gingivitis.  - cephALEXin (KEFLEX) 500 MG capsule; Take 1 capsule (500 mg total) by mouth 2 (two) times daily for 10 days.  Dispense: 20 capsule; Refill: 0 - chlorhexidine (PERIDEX) 0.12 % solution; Use as directed 15 mLs in the mouth or throat daily.  Dispense: 473 mL; Refill: 2        The patient was given clear instructions to go to ER or return to medical center if symptoms do not improve, worsen or new problems develop. The patient verbalized understanding and agreed with plan of care.   Ms. Leonard Jacksonndr L. Riley Lamouglas, FNP-BC [facility] [address]  [place], [place] [id number] [phone]     This note has been created with Dragon speech recognition software and smart phrase technology. Any transcriptional errors are unintentional.

## 2018-07-07 ENCOUNTER — Encounter: Payer: Self-pay | Admitting: Family Medicine

## 2018-07-07 ENCOUNTER — Ambulatory Visit (INDEPENDENT_AMBULATORY_CARE_PROVIDER_SITE_OTHER): Payer: Medicaid Other | Admitting: Family Medicine

## 2018-07-07 VITALS — BP 136/84 | HR 67 | Temp 97.5°F | Resp 16 | Ht 71.5 in | Wt 164.0 lb

## 2018-07-07 DIAGNOSIS — G629 Polyneuropathy, unspecified: Secondary | ICD-10-CM

## 2018-07-07 DIAGNOSIS — I700 Atherosclerosis of aorta: Secondary | ICD-10-CM

## 2018-07-07 DIAGNOSIS — F1010 Alcohol abuse, uncomplicated: Secondary | ICD-10-CM

## 2018-07-07 DIAGNOSIS — F17200 Nicotine dependence, unspecified, uncomplicated: Secondary | ICD-10-CM

## 2018-07-07 DIAGNOSIS — F411 Generalized anxiety disorder: Secondary | ICD-10-CM

## 2018-07-07 MED ORDER — GABAPENTIN 800 MG PO TABS: 800.0000 mg | ORAL_TABLET | Freq: Three times a day (TID) | ORAL | 2 refills | Status: DC

## 2018-07-07 MED ORDER — GABAPENTIN 600 MG PO TABS: 600.0000 mg | ORAL_TABLET | Freq: Three times a day (TID) | ORAL | 2 refills | Status: DC

## 2018-07-07 NOTE — Patient Instructions (Signed)
You can take (2) 400 mg tabs three times a day. I am sending the prescription to the pharmacy.  Paresthesia Paresthesia is a burning or prickling feeling. This feeling can happen in any part of the body. It often happens in the hands, arms, legs, or feet. Usually, it is not painful. In most cases, the feeling goes away in a short time and is not a sign of a serious problem. If you have paresthesia that lasts a long time, you may need to be seen by your doctor. Follow these instructions at home: Alcohol use   Do not drink alcohol if: ? Your doctor tells you not to drink. ? You are pregnant, may be pregnant, or are planning to become pregnant.  If you drink alcohol, limit how much you have: ? 0-1 drink a day for women. ? 0-2 drinks a day for men.  Be aware of how much alcohol is in your drink. In the [place] one drink equals one typical bottle of beer (12 oz), one-half glass of wine (5 oz), or one shot of hard liquor (1 oz). Nutrition  Eat a healthy diet. This includes: ? Eating foods that have a lot of fiber in them, such as fresh fruits and vegetables, whole grains, and beans. ? Limiting foods that have a lot of fat and processed sugars in them, such as fried or sweet foods. General instructions  Take over-the-counter and prescription medicines only as told by your doctor.  Do not use any products that have nicotine or tobacco in them, such as cigarettes and e-cigarettes. If you need help quitting, ask your doctor.  If you have diabetes, work with your doctor to make sure your blood sugar stays in a healthy range.  If your feet feel numb: ? Check for redness, warmth, and swelling every day. ? Wear padded socks and comfortable shoes. These help protect your feet.  Keep all follow-up visits as told by your doctor. This is important. Contact a doctor if:  You have paresthesia that gets worse or does not go away.  Your burning or prickling feeling gets worse when you walk.  You  have pain or cramps.  You feel dizzy.  You have a rash. Get help right away if you:  Feel weak.  Have trouble walking or moving.  Have problems speaking, understanding, or seeing.  Feel confused.  Cannot control when you pee (urinate) or poop (have a bowel movement).  Lose feeling (have numbness) after an injury.  Have new weakness in an arm or leg.  Pass out (faint). Summary  Paresthesia is a burning or prickling feeling. It often happens in the hands, arms, legs, or feet.  In most cases, the feeling goes away in a short time and is not a sign of a serious problem.  If you have paresthesia that lasts a long time, you may need to be seen by your doctor. This information is not intended to replace advice given to you by your health care provider. Make sure you discuss any questions you have with your health care provider. Document Released: 04/19/2008 Document Revised: 05/16/2017 Document Reviewed: 05/16/2017 Elsevier Interactive Patient Education  2019 [employer].

## 2018-07-07 NOTE — Progress Notes (Signed)
[facility]   Progress Note: General Provider: Mike Gip, FNP  SUBJECTIVE:   Leonard Ramos is a 63 y.o. male who  has a past medical history of Alcohol use and Neuropathy.. Patient presents today for Follow-up (3 month follow up on neuropathy )   Patient reports increased neuropathy in bilateral hands and legs that has increased in the past 3 months. Patient reports significant alcohol abuse in the past. Patient reports drinking 2 40 ounce malt liquors a day. He reports 1/2 ppd cigarettes.   Review of Systems  Constitutional: Negative.   HENT: Negative.   Eyes: Negative.   Respiratory: Negative.   Cardiovascular: Negative.   Gastrointestinal: Negative.   Genitourinary: Negative.   Musculoskeletal: Negative.   Skin: Negative.   Neurological: Positive for tingling (bilateral hands).  Psychiatric/Behavioral: Negative.      OBJECTIVE: BP 136/84 (BP Location: Left Arm, Patient Position: Sitting, Cuff Size: Normal)   Pulse 67   Temp (!) 97.5 F (36.4 C) (Oral)   Resp 16   Ht 5' 11.5" (1.816 m)   Wt 164 lb (74.4 kg)   SpO2 100%   BMI 22.55 kg/m   Wt Readings from Last 3 Encounters:  07/07/18 164 lb (74.4 kg)  04/04/18 168 lb (76.2 kg)  03/07/18 166 lb (75.3 kg)     Physical Exam Vitals signs and nursing note reviewed.  Constitutional:      General: He is not in acute distress.    Appearance: He is well-developed.  HENT:     Head: Normocephalic and atraumatic.  Eyes:     Conjunctiva/sclera: Conjunctivae normal.     Pupils: Pupils are equal, round, and reactive to light.  Neck:     Musculoskeletal: Normal range of motion.  Cardiovascular:     Rate and Rhythm: Normal rate and regular rhythm.     Heart sounds: Normal heart sounds.  Pulmonary:     Effort: Pulmonary effort is normal. No respiratory distress.     Breath sounds: Normal breath sounds.  Abdominal:     General: Bowel sounds are normal. There is no  distension.     Palpations: Abdomen is soft.  Musculoskeletal: Normal range of motion.  Skin:    General: Skin is warm and dry.  Neurological:     Mental Status: He is alert and oriented to person, place, and time.  Psychiatric:        Behavior: Behavior normal.        Thought Content: Thought content normal.     ASSESSMENT/PLAN:   1. Neuropathy Will increase gabapentin to 800mg  TID.  - Vitamin B1 - B12 and Folate Panel - Iron, TIBC and Ferritin Panel - CBC with Differential - Comprehensive metabolic panel - gabapentin (NEURONTIN) 800 MG tablet; Take 1 tablet (800 mg total) by mouth 3 (three) times daily.  Dispense: 90 tablet; Refill: 2  2. Aortic atherosclerosis (HCC) Continue with current treatment.   3. Generalized anxiety disorder Continue with current treatment plan. Encouraged therapy.    4. Tobacco dependence Smoking cessation instruction/counseling given:  counseled patient on the dangers of tobacco use, advised patient to stop smoking, and reviewed strategies to maximize success  5. ETOH abuse Encouraged rehab. Patient is not interested at the present time.      Return in about 3 months (around 10/05/2018) for neuropathy.    The patient was given clear instructions to go to ER or return to medical center if symptoms do not improve,  worsen or new problems develop. The patient verbalized understanding and agreed with plan of care.   Ms. Freda Jackson. Riley Lam, FNP-BC [facility] [address] [place], [place] [address] [phone]

## 2018-07-09 ENCOUNTER — Telehealth: Payer: Self-pay

## 2018-07-10 LAB — IRON,TIBC AND FERRITIN PANEL
Ferritin: 421 ng/mL — ABNORMAL HIGH (ref 30–400)
Iron Saturation: 65 % — ABNORMAL HIGH (ref 15–55)
Iron: 173 ug/dL — ABNORMAL HIGH (ref 38–169)
Total Iron Binding Capacity: 266 ug/dL (ref 250–450)
UIBC: 93 ug/dL — ABNORMAL LOW (ref 111–343)

## 2018-07-10 LAB — COMPREHENSIVE METABOLIC PANEL
ALT: 26 IU/L (ref 0–44)
AST: 30 IU/L (ref 0–40)
Albumin/Globulin Ratio: 2 (ref 1.2–2.2)
Albumin: 4.7 g/dL (ref 3.8–4.8)
Alkaline Phosphatase: 71 IU/L (ref 39–117)
BUN/Creatinine Ratio: 12 (ref 10–24)
BUN: 11 mg/dL (ref 8–27)
Bilirubin Total: 0.5 mg/dL (ref 0.0–1.2)
CO2: 22 mmol/L (ref 20–29)
Calcium: 9.5 mg/dL (ref 8.6–10.2)
Chloride: 100 mmol/L (ref 96–106)
Creatinine, Ser: 0.94 mg/dL (ref 0.76–1.27)
GFR calc Af Amer: 101 mL/min/{1.73_m2} (ref >59)
GFR calc non Af Amer: 87 mL/min/{1.73_m2} (ref >59)
Globulin, Total: 2.3 g/dL (ref 1.5–4.5)
Glucose: 88 mg/dL (ref 65–99)
Potassium: 4.7 mmol/L (ref 3.5–5.2)
Sodium: 141 mmol/L (ref 134–144)
Total Protein: 7 g/dL (ref 6.0–8.5)

## 2018-07-10 LAB — B12 AND FOLATE PANEL
Folate: 8.2 ng/mL (ref >3.0)
Vitamin B-12: 457 pg/mL (ref 232–1245)

## 2018-07-10 LAB — CBC WITH DIFFERENTIAL/PLATELET
Basophils Absolute: 0 10*3/uL (ref 0.0–0.2)
Basos: 1 %
EOS (ABSOLUTE): 0 10*3/uL (ref 0.0–0.4)
Eos: 1 %
Hematocrit: 48.7 % (ref 37.5–51.0)
Hemoglobin: 16.8 g/dL (ref 13.0–17.7)
Immature Grans (Abs): 0 10*3/uL (ref 0.0–0.1)
Immature Granulocytes: 0 %
Lymphocytes Absolute: 1.4 10*3/uL (ref 0.7–3.1)
Lymphs: 35 %
MCH: 30.8 pg (ref 26.6–33.0)
MCHC: 34.5 g/dL (ref 31.5–35.7)
MCV: 89 fL (ref 79–97)
Monocytes Absolute: 0.5 10*3/uL (ref 0.1–0.9)
Monocytes: 12 %
Neutrophils Absolute: 2 10*3/uL (ref 1.4–7.0)
Neutrophils: 51 %
Platelets: 229 10*3/uL (ref 150–450)
RBC: 5.45 x10E6/uL (ref 4.14–5.80)
RDW: 13.7 % (ref 11.6–15.4)
WBC: 3.9 10*3/uL (ref 3.4–10.8)

## 2018-07-10 LAB — VITAMIN B1: Thiamine: 108.8 nmol/L (ref 66.5–200.0)

## 2018-07-14 ENCOUNTER — Telehealth: Payer: Self-pay

## 2018-07-14 NOTE — Telephone Encounter (Signed)
Called and spoke with patient, advised that all labs are stable and no changes are needed at this time. Patient verbalized understanding. Thanks!

## 2018-07-14 NOTE — Telephone Encounter (Signed)
-----   Message from Mike Gip, FNP sent at 07/11/2018  5:06 PM EST ----- The  labs are stable without significant clinical change.  All other results are normal or within acceptable limits. No Medication changes

## 2018-08-18 MED FILL — GABAPENTIN 800 MG TABLET: 800 | 30 days supply | Qty: 90 | Fill #0

## 2018-09-25 NOTE — Telephone Encounter (Signed)
Message sent to provider 

## 2018-10-08 ENCOUNTER — Ambulatory Visit (INDEPENDENT_AMBULATORY_CARE_PROVIDER_SITE_OTHER): Payer: Self-pay | Admitting: Family Medicine

## 2018-10-08 ENCOUNTER — Other Ambulatory Visit: Payer: Self-pay

## 2018-10-08 ENCOUNTER — Encounter: Payer: Self-pay | Admitting: Family Medicine

## 2018-10-08 DIAGNOSIS — K089 Disorder of teeth and supporting structures, unspecified: Secondary | ICD-10-CM

## 2018-10-08 DIAGNOSIS — F1010 Alcohol abuse, uncomplicated: Secondary | ICD-10-CM

## 2018-10-08 DIAGNOSIS — F411 Generalized anxiety disorder: Secondary | ICD-10-CM

## 2018-10-08 DIAGNOSIS — K0889 Other specified disorders of teeth and supporting structures: Secondary | ICD-10-CM

## 2018-10-08 DIAGNOSIS — I700 Atherosclerosis of aorta: Secondary | ICD-10-CM

## 2018-10-08 DIAGNOSIS — R220 Localized swelling, mass and lump, head: Secondary | ICD-10-CM

## 2018-10-08 DIAGNOSIS — G629 Polyneuropathy, unspecified: Secondary | ICD-10-CM

## 2018-10-08 DIAGNOSIS — F32A Depression, unspecified: Secondary | ICD-10-CM

## 2018-10-08 DIAGNOSIS — F329 Major depressive disorder, single episode, unspecified: Secondary | ICD-10-CM

## 2018-10-08 MED ORDER — GABAPENTIN 800 MG PO TABS: 800.0000 mg | ORAL_TABLET | Freq: Three times a day (TID) | ORAL | 2 refills | Status: DC

## 2018-10-08 MED ORDER — CHLORHEXIDINE GLUCONATE 0.12 % MT SOLN: 15.0000 mL | Freq: Every day | OROMUCOSAL | 2 refills | Status: DC

## 2018-10-08 MED ORDER — FOLIC ACID 1 MG PO TABS: 1.0000 mg | ORAL_TABLET | Freq: Every day | ORAL | 6 refills | Status: DC

## 2018-10-08 MED ORDER — PRAVASTATIN SODIUM 20 MG PO TABS: 20.0000 mg | ORAL_TABLET | Freq: Every day | ORAL | 1 refills | Status: DC

## 2018-10-08 MED ORDER — VITAMIN B-1 100 MG PO TABS: 100.0000 mg | ORAL_TABLET | Freq: Every day | ORAL | 6 refills | Status: DC

## 2018-10-08 MED ORDER — BUSPIRONE HCL 10 MG PO TABS: 10.0000 mg | ORAL_TABLET | Freq: Two times a day (BID) | ORAL | 5 refills | Status: DC

## 2018-10-08 MED ORDER — AMOXICILLIN 500 MG PO CAPS: 500.0000 mg | ORAL_CAPSULE | Freq: Three times a day (TID) | ORAL | 0 refills | Status: AC

## 2018-10-08 NOTE — Progress Notes (Deleted)
Virtual Visit via Telephone Note  I connected with Angelica Chessman on 10/08/18 at  8:00 AM EDT by telephone and verified that I am speaking with the correct person using two identifiers.   I discussed the limitations, risks, security and privacy concerns of performing an evaluation and management service by telephone and the availability of in person appointments. I also discussed with the patient that there may be a patient responsible charge related to this service. The patient expressed understanding and agreed to proceed.   History of Present Illness:  Past Medical History:  Diagnosis Date  . Alcohol use   . Neuropathy     Current Outpatient Medications on File Prior to Visit  Medication Sig Dispense Refill  . aspirin 81 MG tablet Take 243-324 mg by mouth daily as needed for pain.    . busPIRone (BUSPAR) 10 MG tablet Take 1 tablet (10 mg total) by mouth 2 (two) times daily. 60 tablet 5  . FLUoxetine (PROZAC) 40 MG capsule Take 1 capsule (40 mg total) by mouth daily. 90 capsule 3  . folic acid (FOLVITE) 1 MG tablet Take 1 tablet (1 mg total) by mouth daily. 30 tablet 6  . gabapentin (NEURONTIN) 800 MG tablet Take 1 tablet (800 mg total) by mouth 3 (three) times daily. 90 tablet 2  . pravastatin (PRAVACHOL) 20 MG tablet Take 1 tablet (20 mg total) by mouth daily. 90 tablet 1  . thiamine (VITAMIN B-1) 100 MG tablet Take 1 tablet (100 mg total) by mouth daily. 30 tablet 6  . traZODone (DESYREL) 50 MG tablet Take 1 tablet (50 mg total) by mouth at bedtime as needed for sleep. 30 tablet 2   No current facility-administered medications on file prior to visit.     Current Status: Since his last office visit, he is doing well with no complaints.      He denies fevers, chills, fatigue, recent infections, weight loss, and night sweats. He has not had any headaches, visual changes, dizziness, and falls. No chest pain, heart palpitations, cough and shortness of breath reported. No reports of  GI problems such as nausea, vomiting, diarrhea, and constipation. He has no reports of blood in stools, dysuria and hematuria. No depression or anxiety, and denies suicidal ideations, homicidal ideations, or auditory hallucinations. He denies pain today.        Observations/Objective:  Telephone Virtual Visit   Assessment and Plan:   Follow Up Instructions:    I discussed the assessment and treatment plan with the patient. The patient was provided an opportunity to ask questions and all were answered. The patient agreed with the plan and demonstrated an understanding of the instructions.   The patient was advised to call back or seek an in-person evaluation if the symptoms worsen or if the condition fails to improve as anticipated.  I provided *** minutes of non-face-to-face time during this encounter.   Kallie Locks, FNP

## 2018-10-08 NOTE — Progress Notes (Signed)
[facility]  Virtual Visit via Telephone Note  I connected with Leonard Ramos on 10/08/18 at  8:00 AM EDT by telephone and verified that I am speaking with the correct person using two identifiers.   I discussed the limitations, risks, security and privacy concerns of performing an evaluation and management service by telephone and the availability of in person appointments. I also discussed with the patient that there may be a patient responsible charge related to this service. The patient expressed understanding and agreed to proceed.   History of Present Illness: Leonard Ramos  has a past medical history of Alcohol use and Neuropathy. Patient is doing well. He reports compliance with his medications and denies side effects at the present time. He states that the increase in gabapentin has helped his neuropathic pain. He states that he has been staying indoors due to the quarantine and aiding with his elderly mother. He states that his mood has been good and he is doing well on his current treatment. He denies CP, SOB, dizziness or leg swelling, psychosis, delusional thinking, suicidal/homicidal ideations, intent or plan. Continues to smoke 1/2 ppd and is not currently interested in quitting. He has a temporary medicaid card and is looking for a dentist to help with his poor dentition. He states that his gums are painful and swollen and is requesting a refill on chlohexidine mouth rinse and amoxil.    Observations/Objective: Patient with regular voice tone, rate and rhythm. Speaking calmly and is in no apparent distress.    Assessment and Plan: 1. Neuropathy - gabapentin (NEURONTIN) 800 MG tablet; Take 1 tablet (800 mg total) by mouth 3 (three) times daily.  Dispense: 90 tablet; Refill: 2  2. ETOH abuse - thiamine (VITAMIN B-1) 100 MG tablet; Take 1 tablet (100 mg total) by mouth daily.  Dispense: 30 tablet; Refill: 6 - folic acid (FOLVITE)  1 MG tablet; Take 1 tablet (1 mg total) by mouth daily.  Dispense: 30 tablet; Refill: 6  3. Generalized anxiety disorder - busPIRone (BUSPAR) 10 MG tablet; Take 1 tablet (10 mg total) by mouth 2 (two) times daily.  Dispense: 60 tablet; Refill: 5  4. Depression, unspecified depression type - busPIRone (BUSPAR) 10 MG tablet; Take 1 tablet (10 mg total) by mouth 2 (two) times daily.  Dispense: 60 tablet; Refill: 5  5. Aortic atherosclerosis (HCC) - pravastatin (PRAVACHOL) 20 MG tablet; Take 1 tablet (20 mg total) by mouth daily.  Dispense: 90 tablet; Refill: 1  6. Poor dentition - chlorhexidine (PERIDEX) 0.12 % solution; Use as directed 15 mLs in the mouth or throat daily.  Dispense: 473 mL; Refill: 2  7. Pain, dental - amoxicillin (AMOXIL) 500 MG capsule; Take 1 capsule (500 mg total) by mouth 3 (three) times daily for 7 days.  Dispense: 30 capsule; Refill: 0  8. Swollen gums - amoxicillin (AMOXIL) 500 MG capsule; Take 1 capsule (500 mg total) by mouth 3 (three) times daily for 7 days.  Dispense: 30 capsule; Refill: 0     Follow Up Instructions:  We discussed hand washing, using hand sanitizer when soap and water are not available, only going out when absolutely necessary, and social distancing. Explained to patient that he is immunocompromised and will need to take precautions during this time.   I discussed the assessment and treatment plan with the patient. The patient was provided an opportunity to ask questions and all were answered. The patient agreed with the plan and  demonstrated an understanding of the instructions.   The patient was advised to call back or seek an in-person evaluation if the symptoms worsen or if the condition fails to improve as anticipated.  I provided 15 minutes of non-face-to-face time during this encounter.  Ms. Andr L. Riley Lam, FNP-BC [facility] [address] [place], [place] [address] [phone]

## 2018-10-31 MED FILL — CHLORHEXIDINE 0.12% RINSE: 0.12 | 7 days supply | Qty: 473 | Fill #0

## 2018-10-31 MED FILL — busPIRone HCL 10 MG TABS: 10 | 30 days supply | Qty: 60 | Fill #0

## 2018-10-31 MED FILL — FOLIC ACID 1 MG TABS: 1 | 30 days supply | Qty: 30 | Fill #0

## 2018-10-31 MED FILL — AMOXICILLIN 500 MG CAPSULE: 500 | 7 days supply | Qty: 30 | Fill #0

## 2018-10-31 MED FILL — GABAPENTIN 800 MG TABLET: 800 | 30 days supply | Qty: 90 | Fill #0

## 2018-10-31 MED FILL — PRAVASTATIN SODIUM 20 MG TA: 20 | 30 days supply | Qty: 30 | Fill #0

## 2019-01-08 ENCOUNTER — Ambulatory Visit (INDEPENDENT_AMBULATORY_CARE_PROVIDER_SITE_OTHER): Payer: Medicare Other | Admitting: Family Medicine

## 2019-01-08 ENCOUNTER — Other Ambulatory Visit: Payer: Self-pay

## 2019-01-08 ENCOUNTER — Encounter: Payer: Self-pay | Admitting: Family Medicine

## 2019-01-08 VITALS — BP 142/88 | HR 77 | Temp 97.8°F | Resp 14 | Ht 71.0 in | Wt 162.0 lb

## 2019-01-08 DIAGNOSIS — G629 Polyneuropathy, unspecified: Secondary | ICD-10-CM

## 2019-01-08 DIAGNOSIS — I700 Atherosclerosis of aorta: Secondary | ICD-10-CM | POA: Diagnosis not present

## 2019-01-08 DIAGNOSIS — F17200 Nicotine dependence, unspecified, uncomplicated: Secondary | ICD-10-CM

## 2019-01-08 DIAGNOSIS — E785 Hyperlipidemia, unspecified: Secondary | ICD-10-CM | POA: Diagnosis not present

## 2019-01-08 NOTE — Patient Instructions (Signed)

## 2019-01-08 NOTE — Progress Notes (Signed)
  [name]   Progress Note: General Provider: Lanae Boast, FNP  SUBJECTIVE:   Leonard Ramos is a 63 y.o. male who  has a past medical history of Alcohol use and Neuropathy.. Patient presents today for Follow-up (3 month follow up ), Hyperlipidemia, and Peripheral Neuropathy Patient states that he has insurance now and will work to find a Pharmacist, community. He states that all issues are stable. Does not have complaints today. He continues to smoke 1 ppd and is not interested in cessation.  Review of Systems  Constitutional: Negative.   HENT: Negative.   Eyes: Negative.   Respiratory: Negative.   Cardiovascular: Negative.   Gastrointestinal: Negative.   Genitourinary: Negative.   Musculoskeletal: Negative.   Skin: Negative.   Neurological: Negative.   Psychiatric/Behavioral: Negative.      OBJECTIVE: BP (!) 142/88 (BP Location: Left Arm, Patient Position: Sitting, Cuff Size: Normal) Comment: manually  Pulse 77   Temp 97.8 F (36.6 C) (Oral)   Resp 14   Ht 5\' 11"  (1.803 m)   Wt 162 lb (73.5 kg)   SpO2 99%   BMI 22.59 kg/m   Wt Readings from Last 3 Encounters:  01/08/19 162 lb (73.5 kg)  07/07/18 164 lb (74.4 kg)  04/04/18 168 lb (76.2 kg)     Physical Exam Vitals signs and nursing note reviewed.  Constitutional:      General: He is not in acute distress.    Appearance: Normal appearance.  HENT:     Head: Normocephalic and atraumatic.  Eyes:     Extraocular Movements: Extraocular movements intact.     Conjunctiva/sclera: Conjunctivae normal.     Pupils: Pupils are equal, round, and reactive to light.  Cardiovascular:     Rate and Rhythm: Normal rate and regular rhythm.     Heart sounds: No murmur.  Pulmonary:     Effort: Pulmonary effort is normal.     Breath sounds: Normal breath sounds.  Musculoskeletal: Normal range of motion.  Skin:    General: Skin is warm and dry.  Neurological:     Mental Status: He is alert  and oriented to person, place, and time.  Psychiatric:        Mood and Affect: Mood normal.        Behavior: Behavior normal.        Thought Content: Thought content normal.        Judgment: Judgment normal.     ASSESSMENT/PLAN:   1. Hyperlipidemia, unspecified hyperlipidemia type 2. Neuropathy 3. Tobacco dependence 4. Aortic atherosclerosis ([place]) Labs ordered today. Will adjust medications as needed.  - Lipid Panel - Comprehensive metabolic panel Smoking cessation instruction/counseling given:  counseled patient on the dangers of tobacco use, advised patient to stop smoking, and reviewed strategies to maximize success  Return in about 3 months (around 04/10/2019).    The patient was given clear instructions to go to ER or return to medical center if symptoms do not improve, worsen or new problems develop. The patient verbalized understanding and agreed with plan of care.   Ms. Doug Sou. Nathaneil Canary, FNP-BC [facility] [address] [place], [place] [address] [phone]

## 2019-01-09 LAB — COMPREHENSIVE METABOLIC PANEL
ALT: 61 IU/L — ABNORMAL HIGH (ref 0–44)
AST: 126 IU/L — ABNORMAL HIGH (ref 0–40)
Albumin/Globulin Ratio: 1.8 (ref 1.2–2.2)
Albumin: 4.4 g/dL (ref 3.8–4.8)
Alkaline Phosphatase: 79 IU/L (ref 39–117)
BUN/Creatinine Ratio: 10 (ref 10–24)
BUN: 10 mg/dL (ref 8–27)
Bilirubin Total: 0.5 mg/dL (ref 0.0–1.2)
CO2: 21 mmol/L (ref 20–29)
Calcium: 9.4 mg/dL (ref 8.6–10.2)
Chloride: 103 mmol/L (ref 96–106)
Creatinine, Ser: 0.98 mg/dL (ref 0.76–1.27)
GFR calc Af Amer: 96 mL/min/{1.73_m2} (ref >59)
GFR calc non Af Amer: 83 mL/min/{1.73_m2} (ref >59)
Globulin, Total: 2.5 g/dL (ref 1.5–4.5)
Glucose: 105 mg/dL — ABNORMAL HIGH (ref 65–99)
Potassium: 4.5 mmol/L (ref 3.5–5.2)
Sodium: 142 mmol/L (ref 134–144)
Total Protein: 6.9 g/dL (ref 6.0–8.5)

## 2019-01-09 LAB — LIPID PANEL
Chol/HDL Ratio: 2.2 ratio (ref 0.0–5.0)
Cholesterol, Total: 190 mg/dL (ref 100–199)
HDL: 85 mg/dL (ref >39)
LDL Calculated: 96 mg/dL (ref 0–99)
Triglycerides: 44 mg/dL (ref 0–149)
VLDL Cholesterol Cal: 9 mg/dL (ref 5–40)

## 2019-01-28 ENCOUNTER — Encounter (HOSPITAL_COMMUNITY): Payer: Self-pay | Admitting: *Deleted

## 2019-01-28 ENCOUNTER — Encounter (HOSPITAL_COMMUNITY): Payer: Self-pay

## 2019-02-05 ENCOUNTER — Encounter: Payer: Self-pay | Admitting: Family Medicine

## 2019-03-22 DIAGNOSIS — E559 Vitamin D deficiency, unspecified: Secondary | ICD-10-CM

## 2019-03-22 HISTORY — DX: Vitamin D deficiency, unspecified: E55.9

## 2019-04-10 ENCOUNTER — Other Ambulatory Visit: Payer: Self-pay

## 2019-04-10 ENCOUNTER — Encounter: Payer: Self-pay | Admitting: Physician Assistant

## 2019-04-10 ENCOUNTER — Encounter: Payer: Self-pay | Admitting: Family Medicine

## 2019-04-10 ENCOUNTER — Ambulatory Visit (INDEPENDENT_AMBULATORY_CARE_PROVIDER_SITE_OTHER): Payer: Medicare Other | Admitting: Family Medicine

## 2019-04-10 VITALS — BP 112/82 | HR 98 | Temp 97.8°F | Ht 71.0 in | Wt 161.2 lb

## 2019-04-10 DIAGNOSIS — Z09 Encounter for follow-up examination after completed treatment for conditions other than malignant neoplasm: Secondary | ICD-10-CM

## 2019-04-10 DIAGNOSIS — F411 Generalized anxiety disorder: Secondary | ICD-10-CM | POA: Diagnosis not present

## 2019-04-10 DIAGNOSIS — K625 Hemorrhage of anus and rectum: Secondary | ICD-10-CM

## 2019-04-10 DIAGNOSIS — E559 Vitamin D deficiency, unspecified: Secondary | ICD-10-CM | POA: Diagnosis not present

## 2019-04-10 DIAGNOSIS — K089 Disorder of teeth and supporting structures, unspecified: Secondary | ICD-10-CM | POA: Insufficient documentation

## 2019-04-10 DIAGNOSIS — M545 Low back pain, unspecified: Secondary | ICD-10-CM | POA: Insufficient documentation

## 2019-04-10 DIAGNOSIS — Z8639 Personal history of other endocrine, nutritional and metabolic disease: Secondary | ICD-10-CM

## 2019-04-10 DIAGNOSIS — G8929 Other chronic pain: Secondary | ICD-10-CM | POA: Insufficient documentation

## 2019-04-10 DIAGNOSIS — F1090 Alcohol use, unspecified, uncomplicated: Secondary | ICD-10-CM | POA: Insufficient documentation

## 2019-04-10 DIAGNOSIS — G629 Polyneuropathy, unspecified: Secondary | ICD-10-CM | POA: Diagnosis not present

## 2019-04-10 DIAGNOSIS — R350 Frequency of micturition: Secondary | ICD-10-CM | POA: Insufficient documentation

## 2019-04-10 DIAGNOSIS — Z23 Encounter for immunization: Secondary | ICD-10-CM

## 2019-04-10 DIAGNOSIS — Z789 Other specified health status: Secondary | ICD-10-CM | POA: Insufficient documentation

## 2019-04-10 DIAGNOSIS — F17200 Nicotine dependence, unspecified, uncomplicated: Secondary | ICD-10-CM

## 2019-04-10 DIAGNOSIS — Z0000 Encounter for general adult medical examination without abnormal findings: Secondary | ICD-10-CM | POA: Diagnosis not present

## 2019-04-10 DIAGNOSIS — Z7289 Other problems related to lifestyle: Secondary | ICD-10-CM | POA: Insufficient documentation

## 2019-04-10 DIAGNOSIS — E538 Deficiency of other specified B group vitamins: Secondary | ICD-10-CM

## 2019-04-10 LAB — POCT URINALYSIS DIPSTICK
Blood, UA: NEGATIVE
Glucose, UA: NEGATIVE
Ketones, UA: NEGATIVE
Leukocytes, UA: NEGATIVE
Nitrite, UA: NEGATIVE
Protein, UA: NEGATIVE
Spec Grav, UA: >=1.03 — AB (ref 1.010–1.025)
Urobilinogen, UA: 0.2 E.U./dL
pH, UA: 5.5 (ref 5.0–8.0)

## 2019-04-10 LAB — POCT GLYCOSYLATED HEMOGLOBIN (HGB A1C): Hemoglobin A1C: 5.5 % (ref 4.0–5.6)

## 2019-04-10 LAB — GLUCOSE, POCT (MANUAL RESULT ENTRY): POC Glucose: 158 mg/dl — AB (ref 70–99)

## 2019-04-10 NOTE — Progress Notes (Signed)
ANNUAL PREVENTATIVE VISIT AND CPE  Subjective:  Leonard Ramos is a 63 y.o. male who presents for Medicare Annual Wellness Visit and complete physical.  Date of last medicare wellness visit is unknown. This will be Ms. Haile initial office visit with me. He was previously seeing Lanae Boast, NP for his PCP needs. Since his last office visit, he is doing well with no complaints. He is currently drinking 2 alcoholic beverages a day. He smokes 1 pack of cigarettes daily. He has chronic Neuropathy, which he takes Gabapentin to aide in pain relief. He also has chronic back pain. He is currently looking for a dentist who will take Medicaid. He denies fevers, chills, fatigue, recent infections, weight loss, and night sweats. He has not had any headaches, visual changes, dizziness, and falls. No chest pain, heart palpitations, cough and shortness of breath reported. No reports of GI problems such as nausea, vomiting, diarrhea, and constipation. He has no reports of blood in stools, dysuria and hematuria. No depression or anxiety reported today.   He has had elevated blood pressure for 2 months. His blood pressure has been controlled at home, today their BP is 112/82.  He does workout. He denies chest pain, shortness of breath, dizziness.   He is on cholesterol medication and denies myalgias. His cholesterol is at goal. The cholesterol last visit was: 01/08/2019 and within normal range.    Lab Results  Component Value Date   CHOL 190 01/08/2019   HDL 85 01/08/2019   LDLCALC 96 01/08/2019   TRIG 44 01/08/2019   CHOLHDL 2.2 01/08/2019   He is not Diabetic. He has been working on diet and exercise and denies hyperglycemia. Last A1C in the office was: 5.5 today.  Patient is on Vitamin D supplement.        Names of Other Physician/Practitioners you currently use: 1. [facility] here for primary care 2. Unknown Optometrist (reports on [employer] in [place], [place]), last visit 1 month  ago. 3. His, dentist, last visit 20 years ago.  Azzie Glatter FNP   Medication Review: Current Outpatient Medications on File Prior to Visit  Medication Sig Dispense Refill  . aspirin 81 MG tablet Take 243-324 mg by mouth daily as needed for pain.    . busPIRone (BUSPAR) 10 MG tablet Take 1 tablet (10 mg total) by mouth 2 (two) times daily. 60 tablet 5  . chlorhexidine (PERIDEX) 0.12 % solution Use as directed 15 mLs in the mouth or throat daily. 473 mL 2  . FLUoxetine (PROZAC) 40 MG capsule Take 1 capsule (40 mg total) by mouth daily. 90 capsule 3  . folic acid (FOLVITE) 1 MG tablet Take 1 tablet (1 mg total) by mouth daily. 30 tablet 6  . gabapentin (NEURONTIN) 800 MG tablet Take 1 tablet (800 mg total) by mouth 3 (three) times daily. 90 tablet 2  . pravastatin (PRAVACHOL) 20 MG tablet Take 1 tablet (20 mg total) by mouth daily. 90 tablet 1  . thiamine (VITAMIN B-1) 100 MG tablet Take 1 tablet (100 mg total) by mouth daily. (Patient not taking: Reported on 01/08/2019) 30 tablet 6  . traZODone (DESYREL) 50 MG tablet Take 1 tablet (50 mg total) by mouth at bedtime as needed for sleep. 30 tablet 2   No current facility-administered medications on file prior to visit.     Current Problems (verified) Patient Active Problem List   Diagnosis Date Noted  . Aortic atherosclerosis ([place]) 03/19/2017  . Generalized anxiety  disorder 10/07/2015  . Depression 10/07/2015  . Neuropathy 09/06/2015  . Loss of weight 09/06/2015  . Tobacco dependence 09/06/2015  . Bowel incontinence 03/09/2015  . ETOH abuse 03/09/2015  . Left sided numbness 03/08/2015  . Left-sided weakness 03/08/2015  . Absence of bladder continence 03/08/2015    Screening Tests Health Maintenance  Topic Date Due  . COLONOSCOPY  05/11/2007  . TETANUS/TDAP  03/08/2026  . Hepatitis C Screening  Completed  . HIV Screening  Completed    Immunization History  Administered Date(s) Administered  . Influenza,inj,Quad PF,6+ Mos  03/08/2015, 03/08/2016, 03/06/2017, 03/07/2018  . Pneumococcal Polysaccharide-23 03/08/2015  . Tdap 03/08/2016    Preventative care: Last colonoscopy: None  Influenza: today (04/10/2019).    Allergies as of 04/10/2019   No Known Allergies     Medication List       Accurate as of April 10, 2019  8:37 AM. If you have any questions, ask your nurse or doctor.        aspirin 81 MG tablet Take 243-324 mg by mouth daily as needed for pain.   busPIRone 10 MG tablet Commonly known as: BUSPAR Take 1 tablet (10 mg total) by mouth 2 (two) times daily.   chlorhexidine 0.12 % solution Commonly known as: Peridex Use as directed 15 mLs in the mouth or throat daily.   FLUoxetine 40 MG capsule Commonly known as: PROZAC Take 1 capsule (40 mg total) by mouth daily.   folic acid 1 MG tablet Commonly known as: FOLVITE Take 1 tablet (1 mg total) by mouth daily.   gabapentin 800 MG tablet Commonly known as: Neurontin Take 1 tablet (800 mg total) by mouth 3 (three) times daily.   pravastatin 20 MG tablet Commonly known as: PRAVACHOL Take 1 tablet (20 mg total) by mouth daily.   thiamine 100 MG tablet Commonly known as: VITAMIN B-1 Take 1 tablet (100 mg total) by mouth daily.   traZODone 50 MG tablet Commonly known as: DESYREL Take 1 tablet (50 mg total) by mouth at bedtime as needed for sleep.       History reviewed. No pertinent surgical history. No family history on file.  History reviewed: allergies, current medications, past family history, past medical history, past social history, past surgical history and problem list   Risk Factors: Osteoporosis/FallRisk: dietary calcium and/or vitamin D deficiency In the past year have you fallen or had a near fall?:No History of fracture in the past year: no  Tobacco Social History   Tobacco Use  . Smoking status: Current Every Day Smoker    Packs/day: 0.50    Types: Cigarettes  . Smokeless tobacco: Never Used   Substance Use Topics  . Alcohol use: Yes    Alcohol/week: 40.0 standard drinks    Types: 40 Standard drinks or equivalent per week    Comment: 1-2 daily  . Drug use: No   He does smoke. Patient is a current smoker. Are there smokers in your home (other than you)?  Yes  Alcohol Current alcohol use: history of alcohol abuse current  Caffeine Current caffeine use: coffee every /day  Exercise Current exercise: cardiovascular workout on exercise equipment  Nutrition/Diet Current diet: in general, a "healthy" diet    Cardiac risk factors: smoking/ tobacco exposure.  Depression Screen (Note: if answer to either of the following is "Yes", a more complete depression screening is indicated)   Q1: Over the past two weeks, have you felt down, depressed or hopeless? No  Q2: Over the  past two weeks, have you felt little interest or pleasure in doing things? No  Have you lost interest or pleasure in daily life? No  Do you often feel hopeless? No  Do you cry easily over simple problems? No  Activities of Daily Living In your present state of health, do you have any difficulty performing the following activities?:  Driving? No Managing money?  No Feeding yourself? No Getting from bed to chair? No Climbing a flight of stairs? No Preparing food and eating?: No Bathing or showering? No Getting dressed: No Getting to the toilet? No Using the toilet:No Moving around from place to place: No In the past year have you fallen or had a near fall?:No   Are you sexually active?  No  Do you have more than one partner?  No  Vision Difficulties: No  Hearing Difficulties: No Do you often ask people to speak up or repeat themselves? No Do you experience ringing or noises in your ears? No Do you have difficulty understanding soft or whispered voices? No  Cognition  Do you feel that you have a problem with memory?No  Do you often misplace items? No  Do you feel safe at home?  Yes   Advanced directives Does patient have a Health Care Power of Attorney? No Does patient have a Living Will? No   Objective:     Height 5\' 11"  (1.803 m), weight 161 lb 3.2 oz (73.1 kg). Body mass index is 22.48 kg/m.  General appearance: alert, no distress, WD/WN, male Cognitive Testing  Alert? Yes  Normal Appearance?Yes  Oriented to person? Yes  Place? Yes   Time? Yes  Recall of three objects?  Yes  Can perform simple calculations? Yes  Displays appropriate judgment?Yes  Can read the correct time from a watch face?Yes  HEENT: normocephalic, sclerae anicteric, TMs pearly, nares patent, no discharge or erythema, pharynx normal Oral cavity: MMM, no lesions Neck: supple, no lymphadenopathy, no thyromegaly, no masses Heart: RRR, normal S1, S2, no murmurs Lungs: CTA bilaterally, no wheezes, rhonchi, or rales Abdomen: +bs, soft, non tender, non distended, no masses, no hepatomegaly, no splenomegaly Musculoskeletal: nontender, no swelling, no obvious deformity Extremities: no edema, no cyanosis, no clubbing Pulses: 2+ symmetric, upper and lower extremities, normal cap refill Neurological: alert, oriented x 3, CN2-12 intact, strength normal upper extremities and lower extremities, sensation normal throughout, DTRs 2+ throughout, no cerebellar signs, gait normal Testicular: B/L testicle descended. No masses Rectal/Prostate: No nodules or localized areas of softness, tenderness or induration palpated. Rectal tone normal. No external or internal hemorrhoids noted. Psychiatric: normal affect, behavior normal, pleasant   Assessment:  Patient denies any difficulties at home. No trouble with ADLs, depression or falls. No recent changes to vision or hearing. Is UTD with immunizations. Is UTD with screening. Discussed Advanced Directives, patient agrees to bring us copies of documents if can. Encouraged heart healthy diet, exercise as tolerated and adequate sleep. Declines flu shot. Testicular and  rectal examination done today.      Plan:   1. Physical exam, annual Proprioception and Cerebellar Function off after assess ment of balance using Romberg's Test. Probable r/t alcohol consumption. Otherwise, normal exam.  - Urinalysis Dipstick - Glucose (CBG)  2. History of hyperglycemia Blood glucose is at 158 today. He will continue medication as prescribed, to decrease foods/beverages high in sugars and carbs and follow Heart Healthy or DASH diet. Increase physical activity to at least 30 minutes cardio exercise daily.  - POCT HgB A1C  3. Chronic left-sided low back pain without sciatica  4. Neuropathy Stable. He will continue Gabapentin as prescribed.   5. Rectal bleeding Her reports chronic, intermittent rectal bleeding. No past history of Colonoscopy.  - Ambulatory referral to Gastroenterology  6. Tobacco dependence  7. Alcohol use He plans are to go to detox after the holidays.   8. Generalized anxiety disorder Stable today. He will continue Buspirone and Fluoxetine as prescribed.  - PSA  9. Poor dentition  10. Chronic dental pain List of potential dental offices with reduced prices.  11. Health care maintenance  12. Urinary frequency - PSA  13. Vitamin D deficiency - Vitamin D, 25-hydroxy  14. Vitamin B12 deficiency - Vitamin B12  15. Flu vaccine need - Flu Vaccine QUAD 6+ mos PF IM (Fluarix Quad PF)  16. Follow up He will follow up in 3 months.    During the course of the visit the patient was educated and counseled about appropriate screening and preventive services including:    Pneumococcal vaccine   Influenza vaccine  Td vaccine  Screening electrocardiogram  Bone densitometry screening  Colorectal cancer screening  Diabetes screening  Glaucoma screening  Nutrition counseling   Advanced directives: requested  Screening recommendations, referrals: Vaccinations: Please see documentation below and orders this visit.   Nutrition assessed and recommended  Colonoscopy ordered today (04/10/2019).  Recommended yearly ophthalmology/optometry visit for glaucoma screening and checkup Recommended yearly dental visit for hygiene and checkup Advanced directives - requested  Conditions/risks identified: BMI: Discussed weight loss, diet, and increase physical activity.  Increase physical activity: AHA recommends 150 minutes of physical activity a week.  Medications reviewed Diabetes is at goal, ACE/ARB therapy: No, Reason not on Ace Inhibitor/ARB therapy:  Not Diabetic Urinary Incontinence is not an issue: discussed non pharmacology and pharmacology options.  Fall risk: low- discussed PT, home fall assessment, medications.    Medicare Attestation I have personally reviewed: The patient's medical and social history Their use of alcohol, tobacco or illicit drugs Their current medications and supplements The patient's functional ability including ADLs,fall risks, home safety risks, cognitive, and hearing and visual impairment Diet and physical activities Evidence for depression or mood disorders  The patient's weight, height, BMI, and visual acuity have been recorded in the chart.  I have made referrals, counseling, and provided education to the patient based on review of the above and I have provided the patient with a written personalized care plan for preventive services.     Kallie Locks, FNP   04/10/2019    No orders of the defined types were placed in this encounter.   Orders Placed This Encounter  Procedures  . Flu Vaccine QUAD 6+ mos PF IM (Fluarix Quad PF)  . PSA  . Vitamin B12  . Vitamin D, 25-hydroxy  . Ambulatory referral to Gastroenterology  . Urinalysis Dipstick  . Glucose (CBG)  . POCT HgB A1C     Referral Orders     Ambulatory referral to Gastroenterology    Raliegh Ip,  MSN, FNP-BC [facility] Patient Care [facility] [address] [place], [place] [address] [phone] fax

## 2019-04-11 LAB — VITAMIN B12: Vitamin B-12: 417 pg/mL (ref 232–1245)

## 2019-04-11 LAB — PSA: Prostate Specific Ag, Serum: 0.2 ng/mL (ref 0.0–4.0)

## 2019-04-11 LAB — VITAMIN D 25 HYDROXY (VIT D DEFICIENCY, FRACTURES): Vit D, 25-Hydroxy: 7.8 ng/mL — ABNORMAL LOW (ref 30.0–100.0)

## 2019-04-23 ENCOUNTER — Encounter: Payer: Self-pay | Admitting: Family Medicine

## 2019-04-23 ENCOUNTER — Telehealth: Payer: Self-pay

## 2019-04-23 ENCOUNTER — Other Ambulatory Visit: Payer: Self-pay | Admitting: Family Medicine

## 2019-04-23 DIAGNOSIS — E559 Vitamin D deficiency, unspecified: Secondary | ICD-10-CM

## 2019-04-23 MED ORDER — VITAMIN D (ERGOCALCIFEROL) 1.25 MG (50000 UNIT) PO CAPS: 50000.0000 [IU] | ORAL_CAPSULE | ORAL | 6 refills | Status: DC

## 2019-04-23 MED FILL — VIT D2 1.25 MG (50,000 UNIT: 1.25 MG | 35 days supply | Qty: 5 | Fill #0

## 2019-04-23 NOTE — Telephone Encounter (Signed)
Called and spoke with patient, advised that vitamin D level was low and that rx for once weekly vitamin D has been sent to pharmacy. Advised that he should eat more foods that are high in vitamin D in diet and gave examples listed. Advised that all other labs are stable and to keep next appointment.

## 2019-04-23 NOTE — Telephone Encounter (Signed)
-----   Message from Azzie Glatter, FNP sent at 04/23/2019  7:38 AM EST ----- Vitamin D level is extremely low. Rx for Vitamin D sent to pharmacy today. He should include foods that are high in Vitamin D. These include: Salmon, Cod Liver Oil, Mushrooms, Canned Fish, Milk, and Egg Yolks.  All other labs are stable. Keep follow up appointment. Please inform patient. Thank you.

## 2019-04-29 ENCOUNTER — Ambulatory Visit (INDEPENDENT_AMBULATORY_CARE_PROVIDER_SITE_OTHER): Payer: Medicare Other | Admitting: Physician Assistant

## 2019-04-29 ENCOUNTER — Encounter: Payer: Self-pay | Admitting: Physician Assistant

## 2019-04-29 ENCOUNTER — Other Ambulatory Visit: Payer: Self-pay | Admitting: Family Medicine

## 2019-04-29 ENCOUNTER — Other Ambulatory Visit: Payer: Self-pay

## 2019-04-29 ENCOUNTER — Other Ambulatory Visit (INDEPENDENT_AMBULATORY_CARE_PROVIDER_SITE_OTHER): Payer: Medicare Other

## 2019-04-29 VITALS — BP 130/86 | HR 92 | Temp 98.5°F | Ht 69.0 in | Wt 161.4 lb

## 2019-04-29 DIAGNOSIS — R7989 Other specified abnormal findings of blood chemistry: Secondary | ICD-10-CM | POA: Diagnosis not present

## 2019-04-29 DIAGNOSIS — K625 Hemorrhage of anus and rectum: Secondary | ICD-10-CM

## 2019-04-29 DIAGNOSIS — F1010 Alcohol abuse, uncomplicated: Secondary | ICD-10-CM

## 2019-04-29 DIAGNOSIS — K089 Disorder of teeth and supporting structures, unspecified: Secondary | ICD-10-CM

## 2019-04-29 DIAGNOSIS — F32A Depression, unspecified: Secondary | ICD-10-CM

## 2019-04-29 DIAGNOSIS — G629 Polyneuropathy, unspecified: Secondary | ICD-10-CM

## 2019-04-29 DIAGNOSIS — F329 Major depressive disorder, single episode, unspecified: Secondary | ICD-10-CM

## 2019-04-29 DIAGNOSIS — E559 Vitamin D deficiency, unspecified: Secondary | ICD-10-CM

## 2019-04-29 DIAGNOSIS — Z1159 Encounter for screening for other viral diseases: Secondary | ICD-10-CM

## 2019-04-29 DIAGNOSIS — I700 Atherosclerosis of aorta: Secondary | ICD-10-CM

## 2019-04-29 DIAGNOSIS — F411 Generalized anxiety disorder: Secondary | ICD-10-CM

## 2019-04-29 LAB — COMPREHENSIVE METABOLIC PANEL
ALT: 24 U/L (ref 0–53)
AST: 32 U/L (ref 0–37)
Albumin: 4.3 g/dL (ref 3.5–5.2)
Alkaline Phosphatase: 65 U/L (ref 39–117)
BUN: 9 mg/dL (ref 6–23)
CO2: 24 mEq/L (ref 19–32)
Calcium: 8.7 mg/dL (ref 8.4–10.5)
Chloride: 106 mEq/L (ref 96–112)
Creatinine, Ser: 0.9 mg/dL (ref 0.40–1.50)
GFR: 103.47 mL/min (ref >60.00)
Glucose, Bld: 95 mg/dL (ref 70–99)
Potassium: 4.1 mEq/L (ref 3.5–5.1)
Sodium: 139 mEq/L (ref 135–145)
Total Bilirubin: 0.4 mg/dL (ref 0.2–1.2)
Total Protein: 7.3 g/dL (ref 6.0–8.3)

## 2019-04-29 LAB — CBC WITH DIFFERENTIAL/PLATELET
Basophils Absolute: 0 10*3/uL (ref 0.0–0.1)
Basophils Relative: 0.8 % (ref 0.0–3.0)
Eosinophils Absolute: 0.1 10*3/uL (ref 0.0–0.7)
Eosinophils Relative: 2.2 % (ref 0.0–5.0)
HCT: 49.3 % (ref 39.0–52.0)
Hemoglobin: 16.8 g/dL (ref 13.0–17.0)
Lymphocytes Relative: 29.2 % (ref 12.0–46.0)
Lymphs Abs: 1.4 10*3/uL (ref 0.7–4.0)
MCHC: 34 g/dL (ref 30.0–36.0)
MCV: 92.7 fl (ref 78.0–100.0)
Monocytes Absolute: 0.6 10*3/uL (ref 0.1–1.0)
Monocytes Relative: 12.1 % — ABNORMAL HIGH (ref 3.0–12.0)
Neutro Abs: 2.7 10*3/uL (ref 1.4–7.7)
Neutrophils Relative %: 55.7 % (ref 43.0–77.0)
Platelets: 210 10*3/uL (ref 150.0–400.0)
RBC: 5.32 Mil/uL (ref 4.22–5.81)
RDW: 14.9 % (ref 11.5–15.5)
WBC: 4.9 10*3/uL (ref 4.0–10.5)

## 2019-04-29 LAB — PROTIME-INR
INR: 1 ratio (ref 0.8–1.0)
Prothrombin Time: 11.4 s (ref 9.6–13.1)

## 2019-04-29 MED ORDER — BUSPIRONE HCL 10 MG PO TABS: 10.0000 mg | ORAL_TABLET | Freq: Two times a day (BID) | ORAL | 6 refills | Status: DC

## 2019-04-29 MED ORDER — CHLORHEXIDINE GLUCONATE 0.12 % MT SOLN: 15.0000 mL | Freq: Every day | OROMUCOSAL | 6 refills | Status: DC

## 2019-04-29 MED ORDER — FOLIC ACID 1 MG PO TABS: 1.0000 mg | ORAL_TABLET | Freq: Every day | ORAL | 6 refills | Status: DC

## 2019-04-29 MED ORDER — VITAMIN B-1 100 MG PO TABS: 100.0000 mg | ORAL_TABLET | Freq: Every day | ORAL | 6 refills | Status: DC

## 2019-04-29 MED ORDER — NA SULFATE-K SULFATE-MG SULF 17.5-3.13-1.6 GM/177ML PO SOLN: 1.0000 | Freq: Once | ORAL | 0 refills | Status: AC

## 2019-04-29 MED ORDER — VITAMIN D (ERGOCALCIFEROL) 1.25 MG (50000 UNIT) PO CAPS: 50000.0000 [IU] | ORAL_CAPSULE | ORAL | 6 refills | Status: DC

## 2019-04-29 MED ORDER — PRAVASTATIN SODIUM 20 MG PO TABS: 20.0000 mg | ORAL_TABLET | Freq: Every day | ORAL | 6 refills | Status: DC

## 2019-04-29 MED ORDER — GABAPENTIN 800 MG PO TABS: 800.0000 mg | ORAL_TABLET | Freq: Three times a day (TID) | ORAL | 6 refills | Status: DC

## 2019-04-29 MED FILL — busPIRone HCL 10 MG TABS: 10 | 30 days supply | Qty: 60 | Fill #0

## 2019-04-29 MED FILL — SUPREP BOWEL PREP KIT: 17.5-3.13-1 | 30 days supply | Qty: 354 | Fill #0

## 2019-04-29 MED FILL — CHLORHEXIDINE 0.12% RINSE: 0.12 | 30 days supply | Qty: 473 | Fill #0

## 2019-04-29 MED FILL — FOLIC ACID 1 MG TABS: 1 | 30 days supply | Qty: 30 | Fill #0

## 2019-04-29 MED FILL — PRAVASTATIN SODIUM 20 MG TA: 20 | 30 days supply | Qty: 30 | Fill #0

## 2019-04-29 MED FILL — GABAPENTIN 800 MG TABLET: 800 | 30 days supply | Qty: 90 | Fill #0

## 2019-04-29 NOTE — Patient Instructions (Addendum)
Your provider has requested that you go to the basement level for lab work before leaving today. Press "B" on the elevator. The lab is located at the first door on the left as you exit the elevator.   You have been scheduled for a colonoscopy. Please follow written instructions given to you at your visit today.  Please pick up your prep supplies at the pharmacy within the next 1-3 days. If you use inhalers (even only as needed), please bring them with you on the day of your procedure.   You have been scheduled for an abdominal ultrasound at [facility] on 05/11/2019 at 8:00AM. Please arrive 20 minutes prior to your appointment for registration. Make certain not to have anything to eat or drink after midnight prior to your appointment. Should you need to reschedule your appointment, please contact radiology at [phone]. This test typically takes about 30 minutes to perform.   I appreciate the opportunity to care for you. Amy Esterwood, PA-C

## 2019-04-29 NOTE — Progress Notes (Signed)
Subjective:    Patient ID: Leonard Ramos, male    DOB: Nov 24, 1955, 63 y.o.   MRN: [id number]  HPI Leonard Ramos is a pleasant 63 year old African-American male, new to GI today referred by [facility]/Natalie Clovis Riley, NP for evaluation of rectal bleeding.  Patient has not had any prior GI evaluation or colonoscopy. He does have history of anxiety, EtOH abuse, hyperlipidemia and neuropathy. Patient states that over the past few weeks he has been noticing intermittent bright red blood per rectum which is occurring with bowel movements.  He has noted red blood on the tissue and in the commode but does not think he has had blood mixed with the stool.  No melena.  He denies any anorectal pain or discomfort.  No complaints of abdominal discomfort or changes in bowel habits.  Specifically denies constipation or straining.  Appetite has been good, weight has been stable.  No complaints of heartburn or indigestion. Family history is negative for colon cancer as far as he is aware that admits he does not know much of his family history. Labs were reviewed from August 2020 noted AST 126 ALT of 61.  Patient says he has not been told that he has liver disease.  No prior history of hepatitis that he is aware of.  He does drink beer on a daily basis, generally 8-12.  No recent abdominal imaging.  Review of Systems Pertinent positive and negative review of systems were noted in the above HPI section.  All other review of systems was otherwise negative.  Outpatient Encounter Medications as of 04/29/2019  Medication Sig  . aspirin 81 MG tablet Take 243-324 mg by mouth daily as needed for pain.  . [DISCONTINUED] busPIRone (BUSPAR) 10 MG tablet Take 1 tablet (10 mg total) by mouth 2 (two) times daily.  . [DISCONTINUED] chlorhexidine (PERIDEX) 0.12 % solution Use as directed 15 mLs in the mouth or throat daily.  . [DISCONTINUED] folic acid (FOLVITE) 1 MG tablet Take 1 tablet (1 mg total) by mouth daily.  .  [DISCONTINUED] gabapentin (NEURONTIN) 800 MG tablet Take 1 tablet (800 mg total) by mouth 3 (three) times daily.  . [DISCONTINUED] pravastatin (PRAVACHOL) 20 MG tablet Take 1 tablet (20 mg total) by mouth daily.  . [DISCONTINUED] thiamine (VITAMIN B-1) 100 MG tablet Take 1 tablet (100 mg total) by mouth daily.  . [DISCONTINUED] Vitamin D, Ergocalciferol, (DRISDOL) 1.25 MG (50000 UT) CAPS capsule Take 1 capsule (50,000 Units total) by mouth every 7 (seven) days.  . Na Sulfate-K Sulfate-Mg Sulf 17.5-3.13-1.6 GM/177ML SOLN Take 1 kit by mouth once for 1 dose.  . [DISCONTINUED] FLUoxetine (PROZAC) 40 MG capsule Take 1 capsule (40 mg total) by mouth daily.  . [DISCONTINUED] traZODone (DESYREL) 50 MG tablet Take 1 tablet (50 mg total) by mouth at bedtime as needed for sleep.   No facility-administered encounter medications on file as of 04/29/2019.    No Known Allergies Patient Active Problem List   Diagnosis Date Noted  . Chronic left-sided low back pain without sciatica 04/10/2019  . Alcohol use 04/10/2019  . Poor dentition 04/10/2019  . Chronic dental pain 04/10/2019  . Urinary frequency 04/10/2019  . Aortic atherosclerosis ([place]) 03/19/2017  . Generalized anxiety disorder 10/07/2015  . Depression 10/07/2015  . Neuropathy 09/06/2015  . Loss of weight 09/06/2015  . Tobacco dependence 09/06/2015  . Bowel incontinence 03/09/2015  . ETOH abuse 03/09/2015  . Left sided numbness 03/08/2015  . Left-sided weakness 03/08/2015  . Absence of bladder continence  03/08/2015   Social History   Socioeconomic History  . Marital status: Single    Spouse name: Not on file  . Number of children: 4  . Years of education: Not on file  . Highest education level: Not on file  Occupational History  . Not on file  Social Needs  . Financial resource strain: Not on file  . Food insecurity    Worry: Not on file    Inability: Not on file  . Transportation needs    Medical: Not on file    Non-medical: Not  on file  Tobacco Use  . Smoking status: Current Every Day Smoker    Packs/day: 0.50    Types: Cigarettes  . Smokeless tobacco: Never Used  Substance and Sexual Activity  . Alcohol use: Yes    Alcohol/week: 40.0 standard drinks    Types: 40 Standard drinks or equivalent per week    Comment: 1-2 daily  . Drug use: Yes    Types: Marijuana  . Sexual activity: Yes    Partners: Female  Lifestyle  . Physical activity    Days per week: Not on file    Minutes per session: Not on file  . Stress: Not on file  Relationships  . Social Herbalist on phone: Not on file    Gets together: Not on file    Attends religious service: Not on file    Active member of club or organization: Not on file    Attends meetings of clubs or organizations: Not on file    Relationship status: Not on file  . Intimate partner violence    Fear of current or ex partner: Not on file    Emotionally abused: Not on file    Physically abused: Not on file    Forced sexual activity: Not on file  Other Topics Concern  . Not on file  Social History Narrative  . Not on file    Leonard Ramos Family history is unknown by patient.      Objective:    Vitals:   04/29/19 0911  BP: 130/86  Pulse: 92  Temp: 98.5 F (36.9 C)    Physical Exam Well-developed well-nourished AA /male in no acute distress. [id number]  BMI 23.8  HEENT; nontraumatic normocephalic, EOMI, PER R LA, sclera anicteric. Oropharynx; Neck; supple, no JVD Cardiovascular; regular rate and rhythm with S1-S2, no murmur rub or gallop Pulmonary; Clear bilaterally Abdomen; soft, nontender, nondistended, no palpable mass or hepatosplenomegaly, bowel sounds are active Rectal; not done today Skin; benign exam, no jaundice rash or appreciable lesions Extremities; no clubbing cyanosis or edema skin warm and dry Neuro/Psych; alert and oriented x4, grossly nonfocal mood and affect appropriate       Assessment & Plan:   #62 63 year old  African-American male with new onset of intermittent hematochezia x3 to 4 weeks. Rule out rectal neoplasm, rule out internal hemorrhoids  #2 transaminitis-suspect EtOH induced.  Rule out chronic hepatitis, rule out underlying compensated cirrhosis #3 history of hyperlipidemia #4.  Anxiety  Plan; patient will be scheduled for Colonoscopy with Dr. Havery Moros.  Procedure was discussed in detail with the patient including indications risks and benefits and he is agreeable to proceed. We also discussed elevated LFTs and association with ongoing EtOH.  He was advised no more than 2 alcoholic beverages per day. Check CBC, INR, repeat c-Met, and chronic hepatitis B/C serologies Schedule upper abdominal ultrasound Further recommendations pending findings of above.  Raynie Steinhaus S Mumin Denomme PA-C  04/29/2019   Cc: Azzie Glatter, FNP

## 2019-04-29 NOTE — Progress Notes (Signed)
Agree with assessment and plan as outlined.  

## 2019-04-30 LAB — HEPATITIS C ANTIBODY
Hepatitis C Ab: NONREACTIVE
SIGNAL TO CUT-OFF: 0.01 (ref <1.00)

## 2019-04-30 LAB — HEPATITIS B SURFACE ANTIBODY,QUALITATIVE: Hep B S Ab: REACTIVE — AB

## 2019-04-30 LAB — HEPATITIS B SURFACE ANTIGEN: Hepatitis B Surface Ag: NONREACTIVE

## 2019-05-04 NOTE — Progress Notes (Signed)
Please let patient know recent labs were negative for hepatitis C, and it appears he is immune to hepatitis B.  Liver tests are mildly elevated, consistent with EtOH use.  We discussed minimizing all EtOH use and/or discontinuing. I believe he is scheduled for colonoscopy coming up.

## 2019-05-11 ENCOUNTER — Ambulatory Visit
Admission: RE | Admit: 2019-05-11 | Discharge: 2019-05-11 | Disposition: A | Discharge status: Home / Self Care | Payer: Medicare Other | Source: Ambulatory Visit | Attending: Physician Assistant | Admitting: Physician Assistant

## 2019-05-11 DIAGNOSIS — R7989 Other specified abnormal findings of blood chemistry: Secondary | ICD-10-CM

## 2019-05-11 NOTE — Progress Notes (Signed)
Please let pt know the US shows a fatty liver , no Cirrhosis. Fatty liver can progress to Cirrhosis with ongoing ETOH use -so to avoid future serious complications he needs to stop ETOH , and keep lipids normal . Should have follow up US in a year, and LFT's

## 2019-05-20 ENCOUNTER — Ambulatory Visit (INDEPENDENT_AMBULATORY_CARE_PROVIDER_SITE_OTHER): Payer: Medicare Other

## 2019-05-20 ENCOUNTER — Other Ambulatory Visit: Payer: Self-pay | Admitting: Gastroenterology

## 2019-05-20 DIAGNOSIS — Z1159 Encounter for screening for other viral diseases: Secondary | ICD-10-CM

## 2019-05-20 LAB — SARS CORONAVIRUS 2 (TAT 6-24 HRS): SARS Coronavirus 2: NEGATIVE

## 2019-05-25 ENCOUNTER — Telehealth: Payer: Self-pay | Admitting: Gastroenterology

## 2019-05-25 NOTE — Telephone Encounter (Signed)
No that's okay. If due to an illness, I would not charge him. He just tested negative for COVID19, he should follow up with PCP if symptoms persist.

## 2019-05-25 NOTE — Telephone Encounter (Signed)
Hey Dr. Adela Lank- This patient called this morning to cancel his procedure for tomorrow- 1/5. He has rescheduled for 1/25- states he is having flu like symptoms. Did you want to charge him? Thank you!

## 2019-05-25 NOTE — Telephone Encounter (Signed)
Patient had a procedure scheduled for tomorrow at 1/5. He has rescheduled to 1/25 because he states he has flu like symptoms. I rescheduled him out over 14 days to be safe but he had a covid test done for the procedure on 12/30. Does he need to be rescheduled for another covid test for his new procedure date? Or is that one covid test good.

## 2019-05-26 ENCOUNTER — Encounter: Payer: Medicare Other | Admitting: Gastroenterology

## 2019-05-26 NOTE — Telephone Encounter (Signed)
Patient rescheduled for COVID testing on 06/11/19 at 10:00am. Called patient and gave him the appt. He is good with it.

## 2019-06-11 ENCOUNTER — Ambulatory Visit (INDEPENDENT_AMBULATORY_CARE_PROVIDER_SITE_OTHER): Payer: Medicare Other

## 2019-06-11 DIAGNOSIS — Z1159 Encounter for screening for other viral diseases: Secondary | ICD-10-CM

## 2019-06-15 ENCOUNTER — Encounter: Payer: Medicare Other | Admitting: Gastroenterology

## 2019-07-10 ENCOUNTER — Ambulatory Visit (INDEPENDENT_AMBULATORY_CARE_PROVIDER_SITE_OTHER): Payer: Medicare Other | Admitting: Family Medicine

## 2019-07-10 ENCOUNTER — Other Ambulatory Visit: Payer: Self-pay

## 2019-07-10 ENCOUNTER — Encounter: Payer: Self-pay | Admitting: Family Medicine

## 2019-07-10 VITALS — BP 104/70 | HR 130 | Temp 98.8°F | Ht 69.0 in | Wt 166.2 lb

## 2019-07-10 DIAGNOSIS — Z7289 Other problems related to lifestyle: Secondary | ICD-10-CM | POA: Diagnosis not present

## 2019-07-10 DIAGNOSIS — M545 Low back pain: Secondary | ICD-10-CM

## 2019-07-10 DIAGNOSIS — Z0000 Encounter for general adult medical examination without abnormal findings: Secondary | ICD-10-CM | POA: Diagnosis not present

## 2019-07-10 DIAGNOSIS — Z8639 Personal history of other endocrine, nutritional and metabolic disease: Secondary | ICD-10-CM

## 2019-07-10 DIAGNOSIS — F411 Generalized anxiety disorder: Secondary | ICD-10-CM

## 2019-07-10 DIAGNOSIS — N529 Male erectile dysfunction, unspecified: Secondary | ICD-10-CM

## 2019-07-10 DIAGNOSIS — Z09 Encounter for follow-up examination after completed treatment for conditions other than malignant neoplasm: Secondary | ICD-10-CM

## 2019-07-10 DIAGNOSIS — G629 Polyneuropathy, unspecified: Secondary | ICD-10-CM | POA: Diagnosis not present

## 2019-07-10 DIAGNOSIS — G8929 Other chronic pain: Secondary | ICD-10-CM

## 2019-07-10 DIAGNOSIS — Z720 Tobacco use: Secondary | ICD-10-CM

## 2019-07-10 DIAGNOSIS — F1090 Alcohol use, unspecified, uncomplicated: Secondary | ICD-10-CM

## 2019-07-10 DIAGNOSIS — Z789 Other specified health status: Secondary | ICD-10-CM

## 2019-07-10 LAB — POCT URINALYSIS DIPSTICK
Bilirubin, UA: NEGATIVE
Blood, UA: NEGATIVE
Glucose, UA: NEGATIVE
Leukocytes, UA: NEGATIVE
Nitrite, UA: NEGATIVE
Protein, UA: NEGATIVE
Spec Grav, UA: 1.025 (ref 1.010–1.025)
Urobilinogen, UA: 0.2 E.U./dL
pH, UA: 5 (ref 5.0–8.0)

## 2019-07-10 MED ORDER — SILDENAFIL CITRATE 50 MG PO TABS: 50.0000 mg | ORAL_TABLET | Freq: Every day | ORAL | 0 refills | Status: DC | PRN

## 2019-07-10 MED FILL — FOLIC ACID 1 MG TABS: 1 | 30 days supply | Qty: 30 | Fill #1

## 2019-07-10 MED FILL — PRAVASTATIN SODIUM 20 MG TA: 20 | 90 days supply | Qty: 90 | Fill #1

## 2019-07-10 MED FILL — VIT D2 1.25 MG (50,000 UNIT: 1.25 MG | 84 days supply | Qty: 12 | Fill #1

## 2019-07-10 MED FILL — CHLORHEXIDINE 0.12% RINSE: 0.12 | 30 days supply | Qty: 473 | Fill #1

## 2019-07-10 MED FILL — busPIRone HCL 10 MG TABS: 10 | 90 days supply | Qty: 180 | Fill #1

## 2019-07-10 NOTE — Patient Instructions (Signed)
Sildenafil tablets (Erectile Dysfunction) What is this medicine? SILDENAFIL (sil DEN a fil) is used to treat erection problems in men. This medicine may be used for other purposes; ask your health care provider or pharmacist if you have questions. COMMON BRAND NAME(S): Viagra What should I tell my health care provider before I take this medicine? They need to know if you have any of these conditions:  bleeding disorders  eye or vision problems, including a rare inherited eye disease called retinitis pigmentosa  anatomical deformation of the penis, Peyronie's disease, or history of priapism (painful and prolonged erection)  heart disease, angina, a history of heart attack, irregular heart beats, or other heart problems  high or low blood pressure  history of blood diseases, like sickle cell anemia or leukemia  history of stomach bleeding  kidney disease  liver disease  stroke  an unusual or allergic reaction to sildenafil, other medicines, foods, dyes, or preservatives  pregnant or trying to get pregnant  breast-feeding How should I use this medicine? Take this medicine by mouth with a glass of water. Follow the directions on the prescription label. The dose is usually taken 1 hour before sexual activity. You should not take the dose more than once per day. Do not take your medicine more often than directed. Talk to your pediatrician regarding the use of this medicine in children. This medicine is not used in children for this condition. Overdosage: If you think you have taken too much of this medicine contact a poison control center or emergency room at once. NOTE: This medicine is only for you. Do not share this medicine with others. What if I miss a dose? This does not apply. Do not take double or extra doses. What may interact with this medicine? Do not take this medicine with any of the following medications:  cisapride  nitrates like amyl nitrite, isosorbide  dinitrate, isosorbide mononitrate, nitroglycerin  riociguat This medicine may also interact with the following medications:  antiviral medicines for HIV or AIDS  bosentan  certain medicines for benign prostatic hyperplasia (BPH)  certain medicines for blood pressure  certain medicines for fungal infections like ketoconazole and itraconazole  cimetidine  erythromycin  rifampin This list may not describe all possible interactions. Give your health care provider a list of all the medicines, herbs, non-prescription drugs, or dietary supplements you use. Also tell them if you smoke, drink alcohol, or use illegal drugs. Some items may interact with your medicine. What should I watch for while using this medicine? If you notice any changes in your vision while taking this drug, call your doctor or health care professional as soon as possible. Stop using this medicine and call your health care provider right away if you have a loss of sight in one or both eyes. Contact your doctor or health care professional right away if you have an erection that lasts longer than 4 hours or if it becomes painful. This may be a sign of a serious problem and must be treated right away to prevent permanent damage. If you experience symptoms of nausea, dizziness, chest pain or arm pain upon initiation of sexual activity after taking this medicine, you should refrain from further activity and call your doctor or health care professional as soon as possible. Do not drink alcohol to excess (examples, 5 glasses of wine or 5 shots of whiskey) when taking this medicine. When taken in excess, alcohol can increase your chances of getting a headache or getting dizzy, increasing   your heart rate or lowering your blood pressure. Using this medicine does not protect you or your partner against HIV infection (the virus that causes AIDS) or other sexually transmitted diseases. What side effects may I notice from receiving this  medicine? Side effects that you should report to your doctor or health care professional as soon as possible:  allergic reactions like skin rash, itching or hives, swelling of the face, lips, or tongue  breathing problems  changes in hearing  changes in vision  chest pain  fast, irregular heartbeat  prolonged or painful erection  seizures Side effects that usually do not require medical attention (report to your doctor or health care professional if they continue or are bothersome):  back pain  dizziness  flushing  headache  indigestion  muscle aches  nausea  stuffy or runny nose This list may not describe all possible side effects. Call your doctor for medical advice about side effects. You may report side effects to FDA at 1-800-FDA-1088. Where should I keep my medicine? Keep out of reach of children. Store at room temperature between 15 and 30 degrees C (59 and 86 degrees F). Throw away any unused medicine after the expiration date. NOTE: This sheet is a summary. It may not cover all possible information. If you have questions about this medicine, talk to your doctor, pharmacist, or health care provider.  2020 Elsevier/Gold Standard (2015-04-20 12:00:25)  

## 2019-07-10 NOTE — Progress Notes (Signed)
[facility]   Established Patient Office Visit  Subjective:  Patient ID: Leonard Ramos, male    DOB: 04-Feb-1956  Age: 64 y.o. MRN: [id number]  CC:  Chief Complaint  Patient presents with  . Foot Swelling  . left hip pain    HPI Leonard Ramos is a 64 year old male who presents for Follow Up today.   Past Medical History:  Diagnosis Date  . Alcohol use   . Anxiety   . Hyperlipidemia   . Liver disease   . Neuropathy   . Tobacco use   . Vitamin D deficiency 03/2019   Current Status: Since his last office visit, he is doing well with no complaints. He has c/o left leg pain X 1 week now. He denies any recent trauma to his leg. He is currently taking prescribed medications daily. His anxiety is he today. He denies suicidal ideations, homicidal ideations, or auditory hallucinations. He currently drinks 6 cans of beer daily. He is smoking 1 pack of cigarettes daily. He has occasional shortness of breath. Denies chest pain, heart palpitations, cough and shortness of breath reported. He denies fevers, chills, fatigue, recent infections, weight loss, and night sweats. He has not had any headaches, visual chang es, dizziness, and falls. . No reports of GI problems such as nausea, vomiting, diarrhea, and constipation. He has no reports of blood in stools, dysuria and hematuria.   Past Surgical History:  Procedure Laterality Date  . NO PAST SURGERIES      Family History  Family history unknown: Yes    Social History   Socioeconomic History  . Marital status: Single    Spouse name: Not on file  . Number of children: 4  . Years of education: Not on file  . Highest education level: Not on file  Occupational History  . Not on file  Tobacco Use  . Smoking status: Current Every Day Smoker    Packs/day: 0.50    Types: Cigarettes  . Smokeless tobacco: Never Used  Substance and Sexual Activity  . Alcohol use: Yes    Alcohol/week:  40.0 standard drinks    Types: 40 Standard drinks or equivalent per week    Comment: 1-2 daily  . Drug use: Yes    Types: Marijuana  . Sexual activity: Yes    Partners: Female  Other Topics Concern  . Not on file  Social History Narrative  . Not on file   Social Determinants of Health   Financial Resource Strain:   . Difficulty of Paying Living Expenses: Not on file  Food Insecurity:   . Worried About Programme researcher, broadcasting/film/video in the Last Year: Not on file  . Ran Out of Food in the Last Year: Not on file  Transportation Needs:   . Lack of Transportation (Medical): Not on file  . Lack of Transportation (Non-Medical): Not on file  Physical Activity:   . Days of Exercise per Week: Not on file  . Minutes of Exercise per Session: Not on file  Stress:   . Feeling of Stress : Not on file  Social Connections:   . Frequency of Communication with Friends and Family: Not on file  . Frequency of Social Gatherings with Friends and Family: Not on file  . Attends Religious Services: Not on file  . Active Member of Clubs or Organizations: Not on file  . Attends Banker Meetings: Not on file  . Marital Status:  Not on file  Intimate Partner Violence:   . Fear of Current or Ex-Partner: Not on file  . Emotionally Abused: Not on file  . Physically Abused: Not on file  . Sexually Abused: Not on file    Outpatient Medications Prior to Visit  Medication Sig Dispense Refill  . aspirin 81 MG tablet Take 243-324 mg by mouth daily as needed for pain.    . busPIRone (BUSPAR) 10 MG tablet Take 1 tablet (10 mg total) by mouth 2 (two) times daily. 60 tablet 6  . folic acid (FOLVITE) 1 MG tablet Take 1 tablet (1 mg total) by mouth daily. 30 tablet 6  . gabapentin (NEURONTIN) 800 MG tablet Take 1 tablet (800 mg total) by mouth 3 (three) times daily. 90 tablet 6  . pravastatin (PRAVACHOL) 20 MG tablet Take 1 tablet (20 mg total) by mouth daily. 30 tablet 6  . thiamine (VITAMIN B-1) 100 MG tablet  Take 1 tablet (100 mg total) by mouth daily. 30 tablet 6  . Vitamin D, Ergocalciferol, (DRISDOL) 1.25 MG (50000 UT) CAPS capsule Take 1 capsule (50,000 Units total) by mouth every 7 (seven) days. 5 capsule 6  . chlorhexidine (PERIDEX) 0.12 % solution Use as directed 15 mLs in the mouth or throat daily. 473 mL 6   No facility-administered medications prior to visit.    No Known Allergies  ROS Review of Systems  Constitutional: Negative.   HENT: Negative.   Eyes: Negative.   Respiratory: Positive for cough (occasional ) and shortness of breath (occasional).   Cardiovascular: Negative.   Gastrointestinal: Negative.   Endocrine: Negative.   Genitourinary: Negative.   Musculoskeletal: Negative.   Skin: Negative.   Allergic/Immunologic: Negative.   Neurological: Negative.   Hematological: Negative.   Psychiatric/Behavioral: Negative.    Objective:    Physical Exam  Constitutional: He is oriented to person, place, and time. He appears well-developed and well-nourished.  HENT:  Head: Normocephalic and atraumatic.  Eyes: Conjunctivae are normal.  Cardiovascular: Normal rate, regular rhythm, normal heart sounds and intact distal pulses.  Pulmonary/Chest: Effort normal and breath sounds normal.  Abdominal: Soft. Bowel sounds are normal.  Musculoskeletal:        General: Normal range of motion.     Cervical back: Normal range of motion and neck supple.  Neurological: He is alert and oriented to person, place, and time. He has normal reflexes.  Skin: Skin is warm and dry.  Psychiatric: He has a normal mood and affect. His behavior is normal. Judgment and thought content normal.  Nursing note and vitals reviewed.   BP 104/70   Pulse (!) 130 Comment: just smoked  Temp 98.8 F (37.1 C)   Ht 5\' 9"  (1.753 m)   Wt 166 lb 3.2 oz (75.4 kg)   SpO2 95%   BMI 24.54 kg/m  Wt Readings from Last 3 Encounters:  07/10/19 166 lb 3.2 oz (75.4 kg)  04/29/19 161 lb 6 oz (73.2 kg)  04/10/19  161 lb 3.2 oz (73.1 kg)     Health Maintenance Due  Topic Date Due  . COLONOSCOPY  05/11/2007    There are no preventive care reminders to display for this patient.  Lab Results  Component Value Date   TSH 0.999 12/05/2017   Lab Results  Component Value Date   WBC 4.9 04/29/2019   HGB 16.8 04/29/2019   HCT 49.3 04/29/2019   MCV 92.7 04/29/2019   PLT 210.0 04/29/2019   Lab Results  Component Value  Date   NA 139 04/29/2019   K 4.1 04/29/2019   CO2 24 04/29/2019   GLUCOSE 95 04/29/2019   BUN 9 04/29/2019   CREATININE 0.90 04/29/2019   BILITOT 0.4 04/29/2019   ALKPHOS 65 04/29/2019   AST 32 04/29/2019   ALT 24 04/29/2019   PROT 7.3 04/29/2019   ALBUMIN 4.3 04/29/2019   CALCIUM 8.7 04/29/2019   ANIONGAP 10 02/16/2015   GFR 103.47 04/29/2019   Lab Results  Component Value Date   CHOL 190 01/08/2019   Lab Results  Component Value Date   HDL 85 01/08/2019   Lab Results  Component Value Date   LDLCALC 96 01/08/2019   Lab Results  Component Value Date   TRIG 44 01/08/2019   Lab Results  Component Value Date   CHOLHDL 2.2 01/08/2019   Lab Results  Component Value Date   HGBA1C 5.5 04/10/2019      Assessment & Plan:   1. Chronic left-sided low back pain without sciatica He will continue OTC pain medications as prescribed.  2. Generalized anxiety disorder  3. Neuropathy  4. Alcohol use  5. Tobacco use  6. History of hyperglycemia  7. Erectile dysfunction, unspecified erectile dysfunction type We will initiate Sildenafil today.  - sildenafil (VIAGRA) 50 MG tablet; Take 1 tablet (50 mg total) by mouth daily as needed for erectile dysfunction.  Dispense: 10 tablet; Refill: 0  8. Health care maintenance - POCT urinalysis dipstick  9. Follow up He will follow up in 3 months with Thad Ranger, NP.   Meds ordered this encounter  Medications  . sildenafil (VIAGRA) 50 MG tablet    Sig: Take 1 tablet (50 mg total) by mouth daily as needed for  erectile dysfunction.    Dispense:  10 tablet    Refill:  0    Orders Placed This Encounter  Procedures  . POCT urinalysis dipstick    Referral Orders  No referral(s) requested today    Raliegh Ip,  MSN, FNP-BC [facility] [address] [place], [place] [address] [phone] fax   Problem List Items Addressed This Visit      Nervous and Auditory   Neuropathy     Other   Alcohol use   Chronic left-sided low back pain without sciatica - Primary   Generalized anxiety disorder    Other Visit Diagnoses    Tobacco use       History of hyperglycemia       Erectile dysfunction, unspecified erectile dysfunction type       Relevant Medications   sildenafil (VIAGRA) 50 MG tablet   Health care maintenance       Relevant Orders   POCT urinalysis dipstick (Completed)   Follow up          Meds ordered this encounter  Medications  . sildenafil (VIAGRA) 50 MG tablet    Sig: Take 1 tablet (50 mg total) by mouth daily as needed for erectile dysfunction.    Dispense:  10 tablet    Refill:  0    Follow-up: No follow-ups on file.    Kallie Locks, FNP

## 2019-07-12 DIAGNOSIS — Z720 Tobacco use: Secondary | ICD-10-CM | POA: Insufficient documentation

## 2019-07-12 DIAGNOSIS — N529 Male erectile dysfunction, unspecified: Secondary | ICD-10-CM | POA: Insufficient documentation

## 2019-07-12 DIAGNOSIS — Z8639 Personal history of other endocrine, nutritional and metabolic disease: Secondary | ICD-10-CM | POA: Insufficient documentation

## 2019-07-20 ENCOUNTER — Other Ambulatory Visit: Payer: Self-pay | Admitting: Family Medicine

## 2019-07-20 DIAGNOSIS — G629 Polyneuropathy, unspecified: Secondary | ICD-10-CM

## 2019-07-20 MED FILL — GABAPENTIN 800 MG TABLET: 800 | 30 days supply | Qty: 90 | Fill #1

## 2019-09-02 ENCOUNTER — Other Ambulatory Visit (HOSPITAL_COMMUNITY)
Admission: RE | Admit: 2019-09-02 | Discharge: 2019-09-02 | Disposition: A | Discharge status: Home / Self Care | Payer: Medicare Other | Source: Ambulatory Visit | Attending: Oral Surgery | Admitting: Oral Surgery

## 2019-09-02 ENCOUNTER — Encounter (HOSPITAL_COMMUNITY): Payer: Self-pay | Admitting: Oral Surgery

## 2019-09-02 ENCOUNTER — Other Ambulatory Visit: Payer: Self-pay

## 2019-09-02 DIAGNOSIS — Z01812 Encounter for preprocedural laboratory examination: Secondary | ICD-10-CM | POA: Diagnosis not present

## 2019-09-02 DIAGNOSIS — Z20822 Contact with and (suspected) exposure to covid-19: Secondary | ICD-10-CM | POA: Diagnosis not present

## 2019-09-02 LAB — SARS CORONAVIRUS 2 (TAT 6-24 HRS): SARS Coronavirus 2: NEGATIVE

## 2019-09-02 NOTE — Progress Notes (Signed)
Pt denies SOB, chest pain, and being under the care of a cardiologist. Pt stated that PCP is [employer] <NP. Pt denies having a stress test, echo and cardiac cath. Pt denies having an EKG and chest x ray in the last year. Pt denies recent labs. Pt made aware to stop taking Aspirin (unless otherwise advised by surgeon), vitamins, fish oil and herbal medications. Do not take any NSAIDs ie: Ibuprofen, Advil, Naproxen (Aleve), Motrin, BC and Goody Powder. Pt reminded to quarantine. Pt verbalized understanding of all pre-op instructions.

## 2019-09-03 NOTE — Progress Notes (Signed)
Nurse spoke with Sam, Surgical Coordinator, to make MD aware that an H&P is needed.

## 2019-09-03 NOTE — H&P (Signed)
Patient: Leonard Ramos  PID: [id number]  DOB: [birth year]  SEX: Male   Patient referred by Mertha Finders, DDS\ for full dental extractions.  CC: Painful teeth on/off for months  Past Medical History:  Liver Disease, Neuropathy    Medications: Acid reflux medications, gabapentin, Aspirin, Vitamins    Allergies:     NKDA    Surgeries:   None         Social History       Smoking:   1 ppd        Alcohol: 6 pk per day Drug use:    denies                          Exam: BMI 23. Gross calculus, root exposure, dental caries.  Left maxillary buccal exostosis, bilateral mandibular lingual tori. No purulence, edema, fluctuance, trismus. Oral cancer screening negative. Pharynx clear. No lymphadenopathy.  Panorex: Bone loss U/L teeth. Multiple calculus.   Assessment:  All teeth Non-restorable  secondary to chronic severe generalized periodontitis, decay.  Left maxillary buccal exostosis, bilateral mandibular lingual tori             Plan: Extraction Teeth # 1, 2, 5, 6, 7, 8, 9, 10, 11, 12, 13, 14, 15, 17, 19, 20, 27, 28, 29, 31, Removal  Left maxillary buccal exostosis, bilateral mandibular lingual tori.    Hospital Day surgery.                 Rx:   n            Risks and complications explained. Questions answered.   Georgia Lopes, DMD

## 2019-09-04 ENCOUNTER — Other Ambulatory Visit: Payer: Self-pay

## 2019-09-04 ENCOUNTER — Encounter (HOSPITAL_COMMUNITY)
Admission: RE | Disposition: A | Discharge status: Home / Self Care | Payer: Self-pay | Source: Home / Self Care | Attending: Oral Surgery

## 2019-09-04 ENCOUNTER — Ambulatory Visit (HOSPITAL_COMMUNITY): Payer: Medicare Other | Admitting: Certified Registered"

## 2019-09-04 ENCOUNTER — Encounter (HOSPITAL_COMMUNITY): Payer: Self-pay | Admitting: Oral Surgery

## 2019-09-04 ENCOUNTER — Ambulatory Visit (HOSPITAL_COMMUNITY)
Admission: RE | Admit: 2019-09-04 | Discharge: 2019-09-04 | Disposition: A | Discharge status: Home / Self Care | Payer: Medicare Other | Attending: Oral Surgery | Admitting: Oral Surgery

## 2019-09-04 DIAGNOSIS — F17210 Nicotine dependence, cigarettes, uncomplicated: Secondary | ICD-10-CM | POA: Diagnosis not present

## 2019-09-04 DIAGNOSIS — E785 Hyperlipidemia, unspecified: Secondary | ICD-10-CM | POA: Diagnosis not present

## 2019-09-04 DIAGNOSIS — M899 Disorder of bone, unspecified: Secondary | ICD-10-CM | POA: Insufficient documentation

## 2019-09-04 DIAGNOSIS — G629 Polyneuropathy, unspecified: Secondary | ICD-10-CM | POA: Diagnosis not present

## 2019-09-04 DIAGNOSIS — K05323 Chronic periodontitis, generalized, severe: Secondary | ICD-10-CM | POA: Insufficient documentation

## 2019-09-04 DIAGNOSIS — E559 Vitamin D deficiency, unspecified: Secondary | ICD-10-CM | POA: Diagnosis not present

## 2019-09-04 DIAGNOSIS — K056 Periodontal disease, unspecified: Secondary | ICD-10-CM | POA: Diagnosis not present

## 2019-09-04 DIAGNOSIS — K029 Dental caries, unspecified: Secondary | ICD-10-CM | POA: Diagnosis not present

## 2019-09-04 DIAGNOSIS — K769 Liver disease, unspecified: Secondary | ICD-10-CM | POA: Insufficient documentation

## 2019-09-04 HISTORY — PX: MULTIPLE EXTRACTIONS WITH ALVEOLOPLASTY: SHX5342

## 2019-09-04 HISTORY — DX: Chronic periodontitis, generalized, unspecified severity: K05.329

## 2019-09-04 LAB — HEMOGLOBIN: Hemoglobin: 17.2 g/dL — ABNORMAL HIGH (ref 13.0–17.0)

## 2019-09-04 LAB — COMPREHENSIVE METABOLIC PANEL
ALT: 35 U/L (ref 0–44)
AST: 44 U/L — ABNORMAL HIGH (ref 15–41)
Albumin: 3.7 g/dL (ref 3.5–5.0)
Alkaline Phosphatase: 63 U/L (ref 38–126)
Anion gap: 12 (ref 5–15)
BUN: 7 mg/dL — ABNORMAL LOW (ref 8–23)
CO2: 20 mmol/L — ABNORMAL LOW (ref 22–32)
Calcium: 8.7 mg/dL — ABNORMAL LOW (ref 8.9–10.3)
Chloride: 107 mmol/L (ref 98–111)
Creatinine, Ser: 0.97 mg/dL (ref 0.61–1.24)
GFR calc Af Amer: >60 mL/min (ref >60)
GFR calc non Af Amer: >60 mL/min (ref >60)
Glucose, Bld: 111 mg/dL — ABNORMAL HIGH (ref 70–99)
Potassium: 4.9 mmol/L (ref 3.5–5.1)
Sodium: 139 mmol/L (ref 135–145)
Total Bilirubin: 1.2 mg/dL (ref 0.3–1.2)
Total Protein: 6.4 g/dL — ABNORMAL LOW (ref 6.5–8.1)

## 2019-09-04 SURGERY — MULTIPLE EXTRACTION WITH ALVEOLOPLASTY
Anesthesia: General | Laterality: Bilateral

## 2019-09-04 MED ORDER — PROPOFOL 10 MG/ML IV BOLUS
INTRAVENOUS | Status: AC
  Filled 2019-09-04: qty 20

## 2019-09-04 MED ORDER — LABETALOL HCL 5 MG/ML IV SOLN
5.0000 mg | INTRAVENOUS | Status: AC | PRN
  Administered 2019-09-04 (×4): 5 mg via INTRAVENOUS

## 2019-09-04 MED ORDER — FENTANYL CITRATE (PF) 100 MCG/2ML IJ SOLN
25.0000 ug | INTRAMUSCULAR | Status: DC | PRN
  Administered 2019-09-04 (×2): 50 ug via INTRAVENOUS

## 2019-09-04 MED ORDER — FENTANYL CITRATE (PF) 100 MCG/2ML IJ SOLN
INTRAMUSCULAR | Status: DC | PRN
  Administered 2019-09-04: 100 ug via INTRAVENOUS
  Administered 2019-09-04 (×3): 50 ug via INTRAVENOUS

## 2019-09-04 MED ORDER — AMOXICILLIN 500 MG PO CAPS: 500.0000 mg | ORAL_CAPSULE | Freq: Three times a day (TID) | ORAL | 0 refills | Status: DC

## 2019-09-04 MED ORDER — FENTANYL CITRATE (PF) 250 MCG/5ML IJ SOLN
INTRAMUSCULAR | Status: AC
  Filled 2019-09-04: qty 5

## 2019-09-04 MED ORDER — LIDOCAINE 2% (20 MG/ML) 5 ML SYRINGE
INTRAMUSCULAR | Status: AC
  Filled 2019-09-04: qty 5

## 2019-09-04 MED ORDER — SODIUM CHLORIDE 0.9 % IV SOLN
INTRAVENOUS | Status: AC | PRN
  Administered 2019-09-04: 1000 mL

## 2019-09-04 MED ORDER — OXYMETAZOLINE HCL 0.05 % NA SOLN
NASAL | Status: DC | PRN
  Administered 2019-09-04: 1

## 2019-09-04 MED ORDER — FENTANYL CITRATE (PF) 100 MCG/2ML IJ SOLN
INTRAMUSCULAR | Status: AC
  Filled 2019-09-04: qty 2

## 2019-09-04 MED ORDER — ALBUTEROL SULFATE HFA 108 (90 BASE) MCG/ACT IN AERS
INHALATION_SPRAY | RESPIRATORY_TRACT | Status: DC | PRN
  Administered 2019-09-04: 2 via RESPIRATORY_TRACT

## 2019-09-04 MED ORDER — 0.9 % SODIUM CHLORIDE (POUR BTL) OPTIME
TOPICAL | Status: DC | PRN
  Administered 2019-09-04: 1000 mL

## 2019-09-04 MED ORDER — OXYCODONE-ACETAMINOPHEN 5-325 MG PO TABS: 1.0000 | ORAL_TABLET | ORAL | 0 refills | Status: DC | PRN

## 2019-09-04 MED ORDER — DEXAMETHASONE SODIUM PHOSPHATE 10 MG/ML IJ SOLN
INTRAMUSCULAR | Status: AC
  Filled 2019-09-04: qty 1

## 2019-09-04 MED ORDER — PROPOFOL 10 MG/ML IV BOLUS
INTRAVENOUS | Status: DC | PRN
  Administered 2019-09-04: 50 mg via INTRAVENOUS
  Administered 2019-09-04: 150 mg via INTRAVENOUS

## 2019-09-04 MED ORDER — LABETALOL HCL 5 MG/ML IV SOLN: 10.0000 mg | INTRAVENOUS | Status: DC | PRN

## 2019-09-04 MED ORDER — ONDANSETRON HCL 4 MG/2ML IJ SOLN
INTRAMUSCULAR | Status: DC | PRN
  Administered 2019-09-04: 4 mg via INTRAVENOUS

## 2019-09-04 MED ORDER — DEXAMETHASONE SODIUM PHOSPHATE 10 MG/ML IJ SOLN
INTRAMUSCULAR | Status: DC | PRN
  Administered 2019-09-04: 10 mg via INTRAVENOUS

## 2019-09-04 MED ORDER — MIDAZOLAM HCL 2 MG/2ML IJ SOLN
INTRAMUSCULAR | Status: DC | PRN
  Administered 2019-09-04: 2 mg via INTRAVENOUS

## 2019-09-04 MED ORDER — ROCURONIUM BROMIDE 10 MG/ML (PF) SYRINGE
PREFILLED_SYRINGE | INTRAVENOUS | Status: DC | PRN
  Administered 2019-09-04: 10 mg via INTRAVENOUS
  Administered 2019-09-04: 60 mg via INTRAVENOUS

## 2019-09-04 MED ORDER — ONDANSETRON HCL 4 MG/2ML IJ SOLN
INTRAMUSCULAR | Status: AC
  Filled 2019-09-04: qty 2

## 2019-09-04 MED ORDER — LACTATED RINGERS IV SOLN: INTRAVENOUS | Status: DC

## 2019-09-04 MED ORDER — CEFAZOLIN SODIUM-DEXTROSE 2-4 GM/100ML-% IV SOLN
2.0000 g | INTRAVENOUS | Status: AC
  Administered 2019-09-04: 2 g via INTRAVENOUS
  Filled 2019-09-04: qty 100

## 2019-09-04 MED ORDER — ALBUTEROL SULFATE HFA 108 (90 BASE) MCG/ACT IN AERS
INHALATION_SPRAY | RESPIRATORY_TRACT | Status: AC
  Filled 2019-09-04: qty 6.7

## 2019-09-04 MED ORDER — OXYCODONE-ACETAMINOPHEN 5-325 MG PO TABS
1.0000 | ORAL_TABLET | Freq: Once | ORAL | Status: AC
  Administered 2019-09-04: 1 via ORAL

## 2019-09-04 MED ORDER — LABETALOL HCL 5 MG/ML IV SOLN
INTRAVENOUS | Status: AC
  Filled 2019-09-04: qty 4

## 2019-09-04 MED ORDER — OXYCODONE-ACETAMINOPHEN 5-325 MG PO TABS
ORAL_TABLET | ORAL | Status: AC
  Filled 2019-09-04: qty 1

## 2019-09-04 MED ORDER — MIDAZOLAM HCL 2 MG/2ML IJ SOLN
INTRAMUSCULAR | Status: AC
  Filled 2019-09-04: qty 2

## 2019-09-04 MED ORDER — SUGAMMADEX SODIUM 200 MG/2ML IV SOLN
INTRAVENOUS | Status: DC | PRN
  Administered 2019-09-04: 200 mg via INTRAVENOUS

## 2019-09-04 MED ORDER — ROCURONIUM BROMIDE 10 MG/ML (PF) SYRINGE
PREFILLED_SYRINGE | INTRAVENOUS | Status: AC
  Filled 2019-09-04: qty 10

## 2019-09-04 MED ORDER — OXYMETAZOLINE HCL 0.05 % NA SOLN
NASAL | Status: AC
  Filled 2019-09-04: qty 30

## 2019-09-04 MED ORDER — LIDOCAINE-EPINEPHRINE 2 %-1:100000 IJ SOLN
INTRAMUSCULAR | Status: DC | PRN
  Administered 2019-09-04: 20 mL via INTRADERMAL

## 2019-09-04 MED ORDER — LIDOCAINE 2% (20 MG/ML) 5 ML SYRINGE
INTRAMUSCULAR | Status: DC | PRN
  Administered 2019-09-04: 40 mg via INTRAVENOUS

## 2019-09-04 MED ORDER — LIDOCAINE-EPINEPHRINE 2 %-1:100000 IJ SOLN
INTRAMUSCULAR | Status: AC
  Filled 2019-09-04: qty 1

## 2019-09-04 MED FILL — AMOXICILLIN 500 MG CAPSULE: 500 | 7 days supply | Qty: 21 | Fill #0

## 2019-09-04 SURGICAL SUPPLY — 40 items
BUR CROSS CUT FISSURE 1.6 (BURR) ×2 IMPLANT
BUR CROSS CUT FISSURE 1.6MM (BURR) ×1
BUR EGG ELITE 4.0 (BURR) ×2 IMPLANT
BUR EGG ELITE 4.0MM (BURR) ×1
CANISTER SUCT 3000ML PPV (MISCELLANEOUS) ×3 IMPLANT
COVER SURGICAL LIGHT HANDLE (MISCELLANEOUS) ×3 IMPLANT
COVER WAND RF STERILE (DRAPES) ×1 IMPLANT
DECANTER SPIKE VIAL GLASS SM (MISCELLANEOUS) IMPLANT
DRAPE U-SHAPE 76X120 STRL (DRAPES) ×3 IMPLANT
GAUZE PACKING FOLDED 2  STR (GAUZE/BANDAGES/DRESSINGS) ×3
GAUZE PACKING FOLDED 2 STR (GAUZE/BANDAGES/DRESSINGS) ×1 IMPLANT
GLOVE BIO SURGEON STRL SZ 6.5 (GLOVE) ×1 IMPLANT
GLOVE BIO SURGEON STRL SZ7.5 (GLOVE) ×3 IMPLANT
GLOVE BIO SURGEONS STRL SZ 6.5 (GLOVE) ×1
GLOVE BIOGEL PI IND STRL 6.5 (GLOVE) IMPLANT
GLOVE BIOGEL PI IND STRL 7.0 (GLOVE) IMPLANT
GLOVE BIOGEL PI INDICATOR 6.5 (GLOVE) ×2
GLOVE BIOGEL PI INDICATOR 7.0 (GLOVE) ×2
GOWN STRL REUS W/ TWL LRG LVL3 (GOWN DISPOSABLE) ×1 IMPLANT
GOWN STRL REUS W/ TWL XL LVL3 (GOWN DISPOSABLE) ×1 IMPLANT
GOWN STRL REUS W/TWL LRG LVL3 (GOWN DISPOSABLE) ×3
GOWN STRL REUS W/TWL XL LVL3 (GOWN DISPOSABLE) ×3
IV NS 1000ML (IV SOLUTION) ×3
IV NS 1000ML BAXH (IV SOLUTION) ×1 IMPLANT
KIT BASIN OR (CUSTOM PROCEDURE TRAY) ×3 IMPLANT
KIT TURNOVER KIT B (KITS) ×3 IMPLANT
NDL HYPO 25GX1X1/2 BEV (NEEDLE) ×2 IMPLANT
NDL PRECISIONGLIDE 27X1.5 (NEEDLE) IMPLANT
NEEDLE HYPO 25GX1X1/2 BEV (NEEDLE) ×6 IMPLANT
NEEDLE PRECISIONGLIDE 27X1.5 (NEEDLE) IMPLANT
NS IRRIG 1000ML POUR BTL (IV SOLUTION) ×3 IMPLANT
PAD ARMBOARD 7.5X6 YLW CONV (MISCELLANEOUS) ×1 IMPLANT
POSITIONER HEAD DONUT 9IN (MISCELLANEOUS) ×3 IMPLANT
SLEEVE IRRIGATION ELITE 7 (MISCELLANEOUS) ×3 IMPLANT
SPONGE SURGIFOAM ABS GEL 12-7 (HEMOSTASIS) IMPLANT
SUT CHROMIC 3 0 PS 2 (SUTURE) ×5 IMPLANT
SYR CONTROL 10ML LL (SYRINGE) ×3 IMPLANT
TRAY ENT MC OR (CUSTOM PROCEDURE TRAY) ×3 IMPLANT
TUBING IRRIGATION (MISCELLANEOUS) ×3 IMPLANT
YANKAUER SUCT BULB TIP NO VENT (SUCTIONS) ×3 IMPLANT

## 2019-09-04 NOTE — Anesthesia Procedure Notes (Signed)
Procedure Name: Intubation Date/Time: 09/04/2019 11:27 AM Performed by: Barrington Ellison, CRNA Pre-anesthesia Checklist: Patient identified, Emergency Drugs available, Suction available and Patient being monitored Patient Re-evaluated:Patient Re-evaluated prior to induction Oxygen Delivery Method: Circle System Utilized Preoxygenation: Pre-oxygenation with 100% oxygen Induction Type: IV induction Ventilation: Mask ventilation without difficulty and Oral airway inserted - appropriate to patient size Laryngoscope Size: Mac Grade View: Grade I Nasal Tubes: Nasal Rae, Nasal prep performed and Left Tube size: 7.5 mm Number of attempts: 2 Placement Confirmation: ETT inserted through vocal cords under direct vision,  positive ETCO2 and breath sounds checked- equal and bilateral Secured at: 28 cm Tube secured with: Tape Dental Injury: Teeth and Oropharynx as per pre-operative assessment  Comments: 1st attempt the ETT cuff was compromised.

## 2019-09-04 NOTE — Op Note (Signed)
09/04/2019  12:36 PM  PATIENT:  [name]  64 y.o. male  PRE-OPERATIVE DIAGNOSIS:  NONRESTORABLE TEETH # 1, 2, 5, 6, 7, 8, 9, 10, 11, 12, 13, 14, 15, 17, 19, 20, 22, 27, 28, 29, 31, BILATERAL MANDIBULAR LINGUAL TORI, LEFT MAXILLARY BUCCAL EXOSTOSIS  POST-OPERATIVE DIAGNOSIS:  SAME  PROCEDURE:  Procedure(s): MULTIPLE EXTRACTION  TEETH # 1, 2, 5, 6, 7, 8, 9, 10, 11, 12, 13, 14, 15, 17, 19, 20, 22, 27, 28, 29, 31,REMOVAL  BILATERAL MANDIBULAR LINGUAL TORI,  ALVEOLOPLASTY RIGHT AND LEFT MAXILLA AND MANDIBLE, Removal of left maxillary buccal exostosis  SURGEON:  Surgeon(s): Ocie Doyne, DDS  ANESTHESIA:   local and general  EBL:  minimal  DRAINS: none   SPECIMEN:  No Specimen  COUNTS:  YES  PLAN OF CARE: Discharge to home after PACU  PATIENT DISPOSITION:  PACU - hemodynamically stable.   PROCEDURE DETAILS: Dictation #[id number]  Georgia Lopes, DMD 09/04/2019 12:36 PM

## 2019-09-04 NOTE — H&P (Signed)
H&P documentation  -History and Physical Reviewed  -Patient has been re-examined  -No change in the plan of care  Leonard Ramos  

## 2019-09-04 NOTE — Transfer of Care (Signed)
Immediate Anesthesia Transfer of Care Note  Patient: Leonard Ramos  Procedure(s) Performed: MULTIPLE EXTRACTION W/ ALVEOLOPLASTY, Removal of left maxillary buccal exostosis (Bilateral )  Patient Location: PACU  Anesthesia Type:General  Level of Consciousness: drowsy  Airway & Oxygen Therapy: Patient Spontanous Breathing and Patient connected to face mask oxygen  Post-op Assessment: Report given to RN  Post vital signs: Reviewed and stable  Last Vitals:  Vitals Value Taken Time  BP    Temp    Pulse 95 09/04/19 1249  Resp 24 09/04/19 1249  SpO2 96 % 09/04/19 1249  Vitals shown include unvalidated device data.  Last Pain:  Vitals:   09/04/19 0956  TempSrc:   PainSc: 0-No pain      Patients Stated Pain Goal: 3 (09/04/19 0956)  Complications: No apparent anesthesia complications

## 2019-09-04 NOTE — Op Note (Signed)
NAME: Lightle, Siler A. MEDICAL RECORD BD:[id number] ACCOUNT [id number] DATE OF BIRTH:09/13/1955 FACILITY: [facility] LOCATION: [facility] PHYSICIAN:Braleigh Massoud M. Calianna Kim, DDS  OPERATIVE REPORT  DATE OF PROCEDURE:  09/04/2019  PREOPERATIVE DIAGNOSES:   1.  Nonrestorable teeth secondary to dental caries and periodontal disease numbers 1, 2, 5, 6, 7, 8, 9, 10, 11, 12, 13, 14, 15, 17, 19, 20, 22, 27, 28, 29, 31. 2.  Bilateral mandibular lingual tori.   3.  Left maxillary buccal exostosis.  POSTOPERATIVE DIAGNOSES: 1.  Nonrestorable teeth secondary to dental caries and periodontal disease numbers 1, 2, 5, 6, 7, 8, 9, 10, 11, 12, 13, 14, 15, 17, 19, 20, 22, 27, 28, 29, 31. 2.  Bilateral mandibular lingual tori.   3.  Left maxillary buccal exostosis.  PROCEDURES:   1.  Extraction of teeth numbers 1, 2, 5, 6, 7, 8, 9, 10, 11, 12, 13, 14, 15, 17, 19, 20, 22, 27, 28, 29, 31. 2.  Removal of bilateral mandibular lingual tori.  3.  Removal of left maxillary buccal exostoses.   4.  Alveoplasty, right and left maxilla and mandible.  SURGEON:  Ocie Doyne, DDS  ANESTHESIA:  General, Dr. Noreene Larsson attending, nasal intubation.  DESCRIPTION PROCEDURE:  The patient was taken to the operating room and placed on the table in supine position.  General anesthesia was administered intravenously and a nasal endotracheal tube was placed and secured.  The eyes were protected and the  patient was draped for surgery.  A timeout was performed.  The posterior pharynx was suctioned and a throat pack was placed.  Two percent lidocaine 1:100,000 epinephrine was infiltrated in an inferior alveolar block on the right and left sides and then  buccal and palatal infiltration in the maxilla around the teeth to be removed.  A total of 12 mL was utilized.  A bite block was placed on the right side of the mouth and a sweetheart retractor was used to retract the tongue.  A #15 blade was used to  make an incision beginning at tooth #17 and  carried forward in the gingival sulcus, both buccally and lingually until the midline was reached.  The periosteum was reflected.  The teeth were elevated with a periosteal elevator and removed from the mouth  with the dental forceps.  Tooth #19 fractured during attempted removal, requiring the use of the Stryker handpiece with a fissure bur to section the tooth and remove circumferential bone from around the tooth.  Then, the tooth was removed with the 301  elevator.  Then, the sockets were curetted.  The periosteum was reflected buccally and lingually to expose the alveolar crest, which was irregular in contour, as well as the lingual exostosis.  Alveoplasty was performed using the egg-shaped bur and then  the bone file to smooth the buccal bone and then the exostosis was removed using the egg-shaped bur using the Seldin retractor to protect the lingual tissues.  The bone file was then used to further smooth this area.  Then, the left mandible was  irrigated and closed with 3-0 chromic.  Attention was turned to the left maxilla.  A 15 blade was used to make an incision beginning at tooth #15 and carried forward in the gingival sulcus buccally and palatally to the midline.  The periosteum was  reflected from around these teeth.  There was a large buccal exostosis in the region of 15, 14, 13 and 12.  Reflection of the soft tissue was carried subperiosteally over the exostosis.  The teeth were elevated with a 301 elevator and the forceps was  used to remove teeth numbers 15, 14, 13 and 10.  Teeth numbers 11 and 12 fracture during attempted, requiring removal of circumferential bone with a Stryker handpiece to surgically remove these teeth.  Once the teeth were removed, the sockets were  curetted and alveoplasty was performed.  Because of the irregular contour of the alveolar crest, owing to periodontal disease and tooth fracture during removal, then, the exostosis was removed using the egg-shaped bur, followed  by the bone file to  further smooth the area.  Then, the left maxilla was closed with 3-0 chromic after irrigating.  The bite block and sweetheart retractor were repositioned to the other side of the mouth.  A 15 blade was used to make an incision around teeth numbers 27,  28, 29 and 31.  Tooth 31 was removed with the #151 dental forceps.  Then, the periosteum was reflected from around the remaining teeth in the right mandible, both buccally and lingually.  An extension was created on the alveolar crest in the molar region  and this area was flapped lingually and buccally to provide adequate exposure of the irregular alveolar bone, as well as the lingual torus.  The Seldin retractor was used to reflect the tissues to protect the lingual tissues and then a fissure bur was  used to make a bony trough between the mandibular crest and the exostoses.  Then, the osteotome and mallet were used to cleave the tori from the mandible.  Then, the egg-shaped bur was used to further smooth these areas.  The torus went posteriorly  toward the area of the second molar.  An alveoplasty was then performed using the egg-shaped bur.  The bone file was then used to smooth these areas.  Then, the area was irrigated and closed with 3-0 chromic.  Then, the 15 blade was used to make an  incision around the upper remaining teeth, numbers 1, 2, 5, 6 and 7.  The periosteum was reflected.  The teeth were elevated.  Teeth numbers 1, 2, 7 and 8 were removed using the forceps after elevating with the 301 elevator.  Then, bone was removed from  around teeth numbers 5 and 6 as they could not be removed with the forceps.  Then, they were removed using a 301 elevator and forceps.  Then the sockets were curetted.  The periosteum was reflected to expose the bone.  An alveoplasty was performed using  an egg-shaped bur and bone file.  Then, the areas were all irrigated and closed.  The mouth was examined and found to have good contour, hemostasis  and closure.  The oral cavity was irrigated and suctioned.  Throat pack was removed.  The patient was left  in care of anesthesia for extubation with plans for transfer to the recovery room and then discharge home through day surgery.  ESTIMATED BLOOD LOSS:  Minimal.  COMPLICATIONS:  None.  SPECIMENS:  None.  VN/NUANCE  D:09/04/2019 T:09/04/2019 JOB:[id number]

## 2019-09-07 ENCOUNTER — Encounter: Payer: Self-pay | Admitting: *Deleted

## 2019-09-14 MED FILL — GABAPENTIN 800 MG TABLET: 800 | 30 days supply | Qty: 90 | Fill #2

## 2019-09-18 NOTE — Anesthesia Preprocedure Evaluation (Signed)
Anesthesia Evaluation  Patient identified by MRN, date of birth, ID band Patient awake    Reviewed: Allergy & Precautions, NPO status , Patient's Chart, lab work & pertinent test results  Airway Mallampati: II  TM Distance: >3 FB Neck ROM: Full    Dental  (+) Teeth Intact, Poor Dentition   Pulmonary Current Smoker,    breath sounds clear to auscultation       Cardiovascular  Rhythm:Regular Rate:Normal     Neuro/Psych    GI/Hepatic   Endo/Other    Renal/GU      Musculoskeletal   Abdominal   Peds  Hematology   Anesthesia Other Findings   Reproductive/Obstetrics                             Anesthesia Physical Anesthesia Plan  ASA: III  Anesthesia Plan: General   Post-op Pain Management:    Induction: Intravenous  PONV Risk Score and Plan: Ondansetron and Dexamethasone  Airway Management Planned: Nasal ETT  Additional Equipment:   Intra-op Plan:   Post-operative Plan: Extubation in OR  Informed Consent: I have reviewed the patients History and Physical, chart, labs and discussed the procedure including the risks, benefits and alternatives for the proposed anesthesia with the patient or authorized representative who has indicated his/her understanding and acceptance.       Plan Discussed with: CRNA and Anesthesiologist  Anesthesia Plan Comments:         Anesthesia Quick Evaluation

## 2019-09-18 NOTE — Anesthesia Postprocedure Evaluation (Signed)
Anesthesia Post Note  Patient: Leonard Ramos  Procedure(s) Performed: MULTIPLE EXTRACTION W/ ALVEOLOPLASTY, Removal of left maxillary buccal exostosis (Bilateral )     Patient location during evaluation: PACU Anesthesia Type: General Level of consciousness: awake and alert Pain management: pain level controlled Vital Signs Assessment: post-procedure vital signs reviewed and stable Respiratory status: spontaneous breathing, nonlabored ventilation, respiratory function stable and patient connected to nasal cannula oxygen Cardiovascular status: blood pressure returned to baseline and stable Postop Assessment: no apparent nausea or vomiting Anesthetic complications: no    Last Vitals:  Vitals:   09/04/19 1415 09/04/19 1430  BP: (!) 155/100   Pulse: 73   Resp: 20   Temp: 36.7 C   SpO2: 93% 96%    Last Pain:  Vitals:   09/04/19 1430  TempSrc:   PainSc: 3                  Ananiah Maciolek COKER

## 2019-09-28 ENCOUNTER — Ambulatory Visit: Payer: Medicare Other | Attending: Internal Medicine

## 2019-09-28 DIAGNOSIS — Z23 Encounter for immunization: Secondary | ICD-10-CM

## 2019-09-28 NOTE — Progress Notes (Signed)
   Covid-19 Vaccination Clinic  Name:  CONWAY FEDORA    MRN: [id number] DOB: [birth year]  09/28/2019  Mr. Bartling was observed post Covid-19 immunization for 15 minutes without incident. He was provided with Vaccine Information Sheet and instruction to access the V-Safe system.   Mr. Kluender was instructed to call 911 with any severe reactions post vaccine: Marland Kitchen Difficulty breathing  . Swelling of face and throat  . A fast heartbeat  . A bad rash all over body  . Dizziness and weakness   Immunizations Administered    Name Date Dose VIS Date Route   Moderna COVID-19 Vaccine 09/28/2019  5:22 PM 0.5 mL 04/2019 Intramuscular   Manufacturer: Moderna   Lot: [id number]   NDC: 35573-220-25

## 2019-10-07 ENCOUNTER — Ambulatory Visit: Payer: Medicare Other | Admitting: Nurse Practitioner

## 2019-10-26 ENCOUNTER — Ambulatory Visit: Payer: Medicare Other

## 2019-11-05 MED FILL — GABAPENTIN 800 MG TABLET: 800 | 30 days supply | Qty: 90 | Fill #3

## 2019-11-06 MED FILL — FOLIC ACID 1 MG TABS: 1 | 30 days supply | Qty: 30 | Fill #2

## 2019-11-06 MED FILL — PRAVASTATIN SODIUM 20 MG TA: 20 | 90 days supply | Qty: 90 | Fill #2

## 2019-11-06 MED FILL — busPIRone HCL 10 MG TABS: 10 | 90 days supply | Qty: 180 | Fill #2

## 2019-11-24 DIAGNOSIS — H5213 Myopia, bilateral: Secondary | ICD-10-CM | POA: Diagnosis not present

## 2019-12-30 DIAGNOSIS — H524 Presbyopia: Secondary | ICD-10-CM | POA: Diagnosis not present

## 2020-01-01 MED FILL — GABAPENTIN 800 MG TABLET: 800 | 30 days supply | Qty: 90 | Fill #4

## 2020-01-07 ENCOUNTER — Other Ambulatory Visit: Payer: Self-pay

## 2020-01-07 ENCOUNTER — Emergency Department (HOSPITAL_COMMUNITY): Payer: Medicare Other

## 2020-01-07 ENCOUNTER — Emergency Department (HOSPITAL_COMMUNITY)
Admission: EM | Admit: 2020-01-07 | Discharge: 2020-01-08 | Disposition: A | Discharge status: Home / Self Care | Payer: Medicare Other | Attending: Emergency Medicine | Admitting: Emergency Medicine

## 2020-01-07 DIAGNOSIS — Z7982 Long term (current) use of aspirin: Secondary | ICD-10-CM | POA: Insufficient documentation

## 2020-01-07 DIAGNOSIS — R531 Weakness: Secondary | ICD-10-CM | POA: Insufficient documentation

## 2020-01-07 DIAGNOSIS — R29818 Other symptoms and signs involving the nervous system: Secondary | ICD-10-CM | POA: Diagnosis not present

## 2020-01-07 DIAGNOSIS — G8929 Other chronic pain: Secondary | ICD-10-CM | POA: Diagnosis not present

## 2020-01-07 DIAGNOSIS — M5442 Lumbago with sciatica, left side: Secondary | ICD-10-CM | POA: Diagnosis not present

## 2020-01-07 DIAGNOSIS — M6281 Muscle weakness (generalized): Secondary | ICD-10-CM | POA: Diagnosis not present

## 2020-01-07 DIAGNOSIS — J3489 Other specified disorders of nose and nasal sinuses: Secondary | ICD-10-CM | POA: Insufficient documentation

## 2020-01-07 DIAGNOSIS — Z8679 Personal history of other diseases of the circulatory system: Secondary | ICD-10-CM | POA: Insufficient documentation

## 2020-01-07 DIAGNOSIS — R29810 Facial weakness: Secondary | ICD-10-CM | POA: Diagnosis not present

## 2020-01-07 DIAGNOSIS — F17210 Nicotine dependence, cigarettes, uncomplicated: Secondary | ICD-10-CM | POA: Diagnosis not present

## 2020-01-07 DIAGNOSIS — Z743 Need for continuous supervision: Secondary | ICD-10-CM | POA: Diagnosis not present

## 2020-01-07 DIAGNOSIS — Z79899 Other long term (current) drug therapy: Secondary | ICD-10-CM | POA: Insufficient documentation

## 2020-01-07 DIAGNOSIS — R29898 Other symptoms and signs involving the musculoskeletal system: Secondary | ICD-10-CM

## 2020-01-07 DIAGNOSIS — E785 Hyperlipidemia, unspecified: Secondary | ICD-10-CM | POA: Insufficient documentation

## 2020-01-07 LAB — CBC
HCT: 48.2 % (ref 39.0–52.0)
Hemoglobin: 16.5 g/dL (ref 13.0–17.0)
MCH: 31.5 pg (ref 26.0–34.0)
MCHC: 34.2 g/dL (ref 30.0–36.0)
MCV: 92.2 fL (ref 80.0–100.0)
Platelets: 205 10*3/uL (ref 150–400)
RBC: 5.23 MIL/uL (ref 4.22–5.81)
RDW: 15.1 % (ref 11.5–15.5)
WBC: 4.4 10*3/uL (ref 4.0–10.5)
nRBC: 0 % (ref 0.0–0.2)

## 2020-01-07 LAB — COMPREHENSIVE METABOLIC PANEL
ALT: 49 U/L — ABNORMAL HIGH (ref 0–44)
AST: 67 U/L — ABNORMAL HIGH (ref 15–41)
Albumin: 3.9 g/dL (ref 3.5–5.0)
Alkaline Phosphatase: 66 U/L (ref 38–126)
Anion gap: 14 (ref 5–15)
BUN: 5 mg/dL — ABNORMAL LOW (ref 8–23)
CO2: 22 mmol/L (ref 22–32)
Calcium: 9.2 mg/dL (ref 8.9–10.3)
Chloride: 103 mmol/L (ref 98–111)
Creatinine, Ser: 0.78 mg/dL (ref 0.61–1.24)
GFR calc Af Amer: >60 mL/min (ref >60)
GFR calc non Af Amer: >60 mL/min (ref >60)
Glucose, Bld: 92 mg/dL (ref 70–99)
Potassium: 3.8 mmol/L (ref 3.5–5.1)
Sodium: 139 mmol/L (ref 135–145)
Total Bilirubin: 0.7 mg/dL (ref 0.3–1.2)
Total Protein: 6.8 g/dL (ref 6.5–8.1)

## 2020-01-07 LAB — DIFFERENTIAL
Abs Immature Granulocytes: 0.02 10*3/uL (ref 0.00–0.07)
Basophils Absolute: 0 10*3/uL (ref 0.0–0.1)
Basophils Relative: 1 %
Eosinophils Absolute: 0.1 10*3/uL (ref 0.0–0.5)
Eosinophils Relative: 1 %
Immature Granulocytes: 1 %
Lymphocytes Relative: 42 %
Lymphs Abs: 1.8 10*3/uL (ref 0.7–4.0)
Monocytes Absolute: 0.6 10*3/uL (ref 0.1–1.0)
Monocytes Relative: 13 %
Neutro Abs: 1.9 10*3/uL (ref 1.7–7.7)
Neutrophils Relative %: 42 %

## 2020-01-07 LAB — I-STAT CHEM 8, ED
BUN: 6 mg/dL — ABNORMAL LOW (ref 8–23)
Calcium, Ion: 1.09 mmol/L — ABNORMAL LOW (ref 1.15–1.40)
Chloride: 103 mmol/L (ref 98–111)
Creatinine, Ser: 0.9 mg/dL (ref 0.61–1.24)
Glucose, Bld: 90 mg/dL (ref 70–99)
HCT: 53 % — ABNORMAL HIGH (ref 39.0–52.0)
Hemoglobin: 18 g/dL — ABNORMAL HIGH (ref 13.0–17.0)
Potassium: 3.9 mmol/L (ref 3.5–5.1)
Sodium: 139 mmol/L (ref 135–145)
TCO2: 23 mmol/L (ref 22–32)

## 2020-01-07 LAB — PROTIME-INR
INR: 1 (ref 0.8–1.2)
Prothrombin Time: 12.8 seconds (ref 11.4–15.2)

## 2020-01-07 LAB — APTT: aPTT: 28 seconds (ref 24–36)

## 2020-01-07 NOTE — ED Triage Notes (Signed)
Pt here via EMS for eval of generalized weakness, L greater than right, and decreased sensation on L side x 4 days. Pt reports that leg weakness is normal for him but it is worse than it usually is. Sts he is still able to walk but must be holding onto something.

## 2020-01-08 DIAGNOSIS — R531 Weakness: Secondary | ICD-10-CM | POA: Diagnosis not present

## 2020-01-08 MED ORDER — KETOROLAC TROMETHAMINE 15 MG/ML IJ SOLN
15.0000 mg | Freq: Once | INTRAMUSCULAR | Status: AC
  Administered 2020-01-08: 15 mg via INTRAMUSCULAR
  Filled 2020-01-08: qty 1

## 2020-01-08 MED ORDER — LIDOCAINE 5 % EX PTCH: 1.0000 | MEDICATED_PATCH | CUTANEOUS | 0 refills | Status: DC

## 2020-01-08 MED ORDER — PREDNISONE 10 MG (21) PO TBPK: ORAL_TABLET | Freq: Every day | ORAL | 0 refills | Status: DC

## 2020-01-08 NOTE — Discharge Instructions (Signed)
Take prednisone as prescribed.  Take the entire course.  Once this is finished, if you need further pain or inflammation control, you may take ibuprofen 3 times a day as needed.  Do not take other anti-inflammatories at the same time (Advil, Motrin, ibuprofen, Aleve). You may supplement with Tylenol if you need further pain control. Use the lidoderm patch as needed for muscle pain or back pain.  Do the back stretches in the paperwork to help with your symptoms. Follow up with your primary care doctor if pain is not improving with this treatment in 2 weeks.  Return to the ER if you develop high fevers, numbness, loss of bowel or bladder control, or any new or concerning symptoms.

## 2020-01-08 NOTE — ED Provider Notes (Signed)
[facility]   CSN: [id number] Arrival date & time: 01/07/20  1548     History Chief Complaint  Patient presents with  . Weakness    Georgianne FickVirgil A Brunton is a 64 y.o. male presenting for evaluation of L leg weakness.   Patient states for the past 2 to 3 weeks, he has had intermittent left leg weakness.  He states that sometimes when he is walking, he feels his leg is heavier.  It has not been worsening over the past several weeks, just and constant.  He has not tried anything for this.  He initially denies back pain, but then states he is having pain in his left low back that goes down to his buttocks.  He has not had this evaluated by a provider before tonight.  He denies fall, trauma, or injury.  He denies fevers, chills, loss of bowel bladder control.  He denies history of cancer or IVDU.  No left upper extremity weakness. No weakness on the R side.   HPI     Past Medical History:  Diagnosis Date  . Alcohol use   . Anxiety   . Hyperlipidemia   . Liver disease   . Neuropathy   . Periodontitis, chronic, generalized   . Tobacco use   . Vitamin D deficiency 03/2019    Patient Active Problem List   Diagnosis Date Noted  . Tobacco use 07/12/2019  . History of hyperglycemia 07/12/2019  . Erectile dysfunction 07/12/2019  . Chronic left-sided low back pain without sciatica 04/10/2019  . Alcohol use 04/10/2019  . Poor dentition 04/10/2019  . Chronic dental pain 04/10/2019  . Urinary frequency 04/10/2019  . Aortic atherosclerosis (HCC) 03/19/2017  . Generalized anxiety disorder 10/07/2015  . Depression 10/07/2015  . Neuropathy 09/06/2015  . Loss of weight 09/06/2015  . Tobacco dependence 09/06/2015  . Bowel incontinence 03/09/2015  . ETOH abuse 03/09/2015  . Left sided numbness 03/08/2015  . Left-sided weakness 03/08/2015  . Absence of bladder continence 03/08/2015    Past Surgical History:  Procedure Laterality Date    . MULTIPLE EXTRACTIONS WITH ALVEOLOPLASTY Bilateral 09/04/2019   Procedure: MULTIPLE EXTRACTION W/ ALVEOLOPLASTY, Removal of left maxillary buccal exostosis;  Surgeon: Ocie DoyneJensen, Scott, DDS;  [facility];  Service: Oral Surgery;  Laterality: Bilateral;  . NO PAST SURGERIES         Family History  Family history unknown: Yes    Social History   Tobacco Use  . Smoking status: Current Every Day Smoker    Packs/day: 1.00    Types: Cigarettes  . Smokeless tobacco: Never Used  Vaping Use  . Vaping Use: Never used  Substance Use Topics  . Alcohol use: Yes    Alcohol/week: 40.0 standard drinks    Types: 40 Standard drinks or equivalent per week    Comment: 1-2 daily  . Drug use: Yes    Types: Marijuana    Comment: last use 09/02/2019    Home Medications Prior to Admission medications   Medication Sig Start Date End Date Taking? Authorizing Provider  amoxicillin (AMOXIL) 500 MG capsule Take 1 capsule (500 mg total) by mouth 3 (three) times daily. 09/04/19   Ocie DoyneJensen, Scott, DDS  aspirin 81 MG chewable tablet Chew 81 mg by mouth daily.    [provider]  busPIRone (BUSPAR) 10 MG tablet Take 1 tablet (10 mg total) by mouth 2 (two) times daily. 04/29/19   Kallie LocksStroud, Natalie M, FNP  Carboxymethylcellul-Glycerin (LUBRICATING  EYE DROPS OP) Place 1 drop into both eyes daily as needed (dry eyes).    [provider]  chlorhexidine (PERIDEX) 0.12 % solution Use as directed 15 mLs in the mouth or throat daily. Patient not taking: Reported on 07/12/2019 04/29/19   Kallie Locks, FNP  FLUoxetine (PROZAC) 40 MG capsule Take 40 mg by mouth daily.    [provider]  folic acid (FOLVITE) 1 MG tablet Take 1 tablet (1 mg total) by mouth daily. 04/29/19   Kallie Locks, FNP  gabapentin (NEURONTIN) 800 MG tablet TAKE 1 TABLET (800 MG TOTAL) BY MOUTH 3 (THREE) TIMES DAILY. 07/20/19   Kallie Locks, FNP  lidocaine (LIDODERM) 5 % Place 1 patch onto the skin daily. Remove &  Discard patch within 12 hours or as directed by MD 01/08/20   Camron Essman, PA-C  oxyCODONE-acetaminophen (PERCOCET) 5-325 MG tablet Take 1 tablet by mouth every 4 (four) hours as needed. 09/04/19   Ocie Doyne, DDS  pravastatin (PRAVACHOL) 20 MG tablet Take 1 tablet (20 mg total) by mouth daily. 04/29/19   Kallie Locks, FNP  predniSONE (STERAPRED UNI-PAK 21 TAB) 10 MG (21) TBPK tablet Take by mouth daily. Take 6 tabs by mouth daily  for 2 days, then 5 tabs for 2 days, then 4 tabs for 2 days, then 3 tabs for 2 days, 2 tabs for 2 days, then 1 tab by mouth daily for 2 days 01/08/20   Leondra Cullin, PA-C  sildenafil (VIAGRA) 50 MG tablet Take 1 tablet (50 mg total) by mouth daily as needed for erectile dysfunction. 07/10/19   Kallie Locks, FNP  thiamine (VITAMIN B-1) 100 MG tablet Take 1 tablet (100 mg total) by mouth daily. 04/29/19   Kallie Locks, FNP  Vitamin D, Ergocalciferol, (DRISDOL) 1.25 MG (50000 UT) CAPS capsule Take 1 capsule (50,000 Units total) by mouth every 7 (seven) days. Patient taking differently: Take 50,000 Units by mouth every Wednesday.  04/29/19   Kallie Locks, FNP    Allergies    Patient has no known allergies.  Review of Systems   Review of Systems  Musculoskeletal: Positive for back pain.  Neurological: Positive for weakness (intermittent, L leg).  All other systems reviewed and are negative.   Physical Exam Updated Vital Signs BP 138/87 (BP Location: Right Arm)   Pulse 72   Temp 97.9 F (36.6 C)   Resp 16   SpO2 96%   Physical Exam Vitals and nursing note reviewed.  Constitutional:      General: He is not in acute distress.    Appearance: He is well-developed.     Comments: Sitting in the bed in no acute distress  HENT:     Head: Normocephalic and atraumatic.  Eyes:     Extraocular Movements: Extraocular movements intact.     Conjunctiva/sclera: Conjunctivae normal.     Pupils: Pupils are equal, round, and reactive to light.    Cardiovascular:     Rate and Rhythm: Normal rate and regular rhythm.     Pulses: Normal pulses.  Pulmonary:     Effort: Pulmonary effort is normal. No respiratory distress.     Breath sounds: Normal breath sounds. No wheezing.  Abdominal:     Palpations: Abdomen is soft. There is no mass.     Tenderness: There is no abdominal tenderness. There is no guarding or rebound.  Musculoskeletal:        General: Tenderness present. Normal range of motion.  Cervical back: Normal range of motion and neck supple.     Comments: Tenderness palpation of left low back musculature as well as over the left buttock.  No tenderness palpation over midline spine.  No step-offs or deformities. Pedal pulses 2+ bilaterally.  Good distal sensation and cap refill.  Strength equal in the lower extremities on my exam.  No saddle paresthesia.  Skin:    General: Skin is warm and dry.     Capillary Refill: Capillary refill takes less than 2 seconds.     Findings: No rash.  Neurological:     Mental Status: He is alert and oriented to person, place, and time.     ED Results / Procedures / Treatments   Labs (all labs ordered are listed, but only abnormal results are displayed) Labs Reviewed  COMPREHENSIVE METABOLIC PANEL - Abnormal; Notable for the following components:      Result Value   BUN 5 (*)    AST 67 (*)    ALT 49 (*)    All other components within normal limits  I-STAT CHEM 8, ED - Abnormal; Notable for the following components:   BUN 6 (*)    Calcium, Ion 1.09 (*)    Hemoglobin 18.0 (*)    HCT 53.0 (*)    All other components within normal limits  PROTIME-INR  APTT  CBC  DIFFERENTIAL  CBG MONITORING, ED    EKG EKG Interpretation  Date/Time:  Thursday January 07 2020 15:45:47 EDT Ventricular Rate:  92 PR Interval:  156 QRS Duration: 86 QT Interval:  390 QTC Calculation: 482 R Axis:   55 Text Interpretation: Normal sinus rhythm with sinus arrhythmia Septal infarct , age undetermined  Abnormal ECG No significant change since last tracing Confirmed by Drema Pry [phone]) on 01/07/2020 11:53:19 PM   Radiology CT HEAD WO CONTRAST  Result Date: 01/07/2020 CLINICAL DATA:  64 year old male with neurologic deficit. EXAM: CT HEAD WITHOUT CONTRAST TECHNIQUE: Contiguous axial images were obtained from the base of the skull through the vertex without intravenous contrast. COMPARISON:  Head CT dated 02/16/2015. FINDINGS: Brain: The ventricles and sulci appropriate size for patient's age. The gray-white matter discrimination is preserved. 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 There is no acute intracranial hemorrhage. No midline shift no extra-axial fluid collection. Vascular: No hyperdense vessel or unexpected calcification. Skull: Normal. Negative for fracture or focal lesion. Sinuses/Orbits: Mild mucoperiosteal thickening of paranasal sinuses. No air-fluid level. The mastoid air cells are clear. Other: None IMPRESSION: 1. No acute intracranial pathology. 2. No significant interval change in the appearance of the cystic sellar/suprasellar mass compared to the CT of 2016. Electronically Signed   By: Elgie Collard M.D.   On: 01/07/2020 19:12    Procedures Procedures (including critical care time)  Medications Ordered in ED Medications  ketorolac (TORADOL) 15 MG/ML injection 15 mg (15 mg Intramuscular Given 01/08/20 0515)    ED Course  I have reviewed the triage vital signs and the nursing notes.  Pertinent labs & imaging results that were available during my care of the patient were reviewed by me and considered in my medical decision making (see chart for details).    MDM Rules/Calculators/A&P                          Patient resenting for evaluation of left leg weakness.  This has been going  on for several weeks, is intermittent.  On my exam, there is no objective  weakness.  No numbness.  As patient also has back pain that extends into his buttock, likely sciatic/originating in the back.  Doubt stroke.  No red flags of back pain.  History and exam not consistent with cauda equina syndrome, myelopathy, or infection.  As such, will treat with steroid taper and have him follow-up with PCP/Ortho.  Labs obtained in triage read interpreted by me, overall reassuring.  CT head negative for acute findings.  EKG without signs of ischemia.  Discussed findings and plan with patient, who is agreeable.  At this time, patient appears safe for discharge.  Return precautions given.  Patient states he understands and agrees to plan.  Final Clinical Impression(s) / ED Diagnoses Final diagnoses:  Left leg weakness  Chronic low back pain with left-sided sciatica, unspecified back pain laterality    Rx / DC Orders ED Discharge Orders         Ordered    predniSONE (STERAPRED UNI-PAK 21 TAB) 10 MG (21) TBPK tablet  Daily        01/08/20 0431    lidocaine (LIDODERM) 5 %  Every 24 hours        01/08/20 0431           Alveria Apley, PA-C 01/09/20 0015    Nira Conn, MD 01/09/20 1137

## 2020-01-11 MED FILL — VIT D2 1.25 MG (50,000 UNIT: 1.25 MG | 84 days supply | Qty: 12 | Fill #2

## 2020-01-13 ENCOUNTER — Telehealth: Payer: Self-pay | Admitting: Family Medicine

## 2020-01-13 NOTE — Telephone Encounter (Signed)
Pt was called and vm was left for Pt to call the office to schedule appointment

## 2020-01-15 ENCOUNTER — Encounter: Payer: Self-pay | Admitting: Family Medicine

## 2020-01-15 ENCOUNTER — Other Ambulatory Visit: Payer: Self-pay

## 2020-01-15 ENCOUNTER — Telehealth: Payer: Self-pay | Admitting: Family Medicine

## 2020-01-15 ENCOUNTER — Ambulatory Visit (INDEPENDENT_AMBULATORY_CARE_PROVIDER_SITE_OTHER): Payer: Medicare Other | Admitting: Family Medicine

## 2020-01-15 VITALS — BP 105/75 | HR 86 | Temp 97.3°F | Resp 17 | Ht 71.0 in | Wt 146.2 lb

## 2020-01-15 DIAGNOSIS — M545 Low back pain, unspecified: Secondary | ICD-10-CM

## 2020-01-15 DIAGNOSIS — F1090 Alcohol use, unspecified, uncomplicated: Secondary | ICD-10-CM

## 2020-01-15 DIAGNOSIS — Z7289 Other problems related to lifestyle: Secondary | ICD-10-CM

## 2020-01-15 DIAGNOSIS — K089 Disorder of teeth and supporting structures, unspecified: Secondary | ICD-10-CM

## 2020-01-15 DIAGNOSIS — G8929 Other chronic pain: Secondary | ICD-10-CM | POA: Diagnosis not present

## 2020-01-15 DIAGNOSIS — Z720 Tobacco use: Secondary | ICD-10-CM

## 2020-01-15 DIAGNOSIS — F411 Generalized anxiety disorder: Secondary | ICD-10-CM

## 2020-01-15 DIAGNOSIS — Z09 Encounter for follow-up examination after completed treatment for conditions other than malignant neoplasm: Secondary | ICD-10-CM

## 2020-01-15 DIAGNOSIS — Z789 Other specified health status: Secondary | ICD-10-CM

## 2020-01-15 DIAGNOSIS — K08409 Partial loss of teeth, unspecified cause, unspecified class: Secondary | ICD-10-CM | POA: Diagnosis not present

## 2020-01-15 LAB — POCT URINALYSIS DIPSTICK
Bilirubin, UA: NEGATIVE
Blood, UA: NEGATIVE
Glucose, UA: NEGATIVE
Ketones, UA: NEGATIVE
Leukocytes, UA: NEGATIVE
Nitrite, UA: NEGATIVE
Protein, UA: NEGATIVE
Spec Grav, UA: 1.015 (ref 1.010–1.025)
Urobilinogen, UA: 2 E.U./dL — AB
pH, UA: 5.5 (ref 5.0–8.0)

## 2020-01-15 MED ORDER — PREGABALIN 25 MG PO CAPS: 25.0000 mg | ORAL_CAPSULE | Freq: Two times a day (BID) | ORAL | 3 refills | Status: DC

## 2020-01-15 MED FILL — PREGABALIN 25 MG CAPS: 25 | 30 days supply | Qty: 60 | Fill #0

## 2020-01-15 NOTE — Patient Instructions (Signed)
Sciatica  Sciatica is pain, weakness, tingling, or loss of feeling (numbness) along the sciatic nerve. The sciatic nerve starts in the lower back and goes down the back of each leg. Sciatica usually goes away on its own or with treatment. Sometimes, sciatica may come back (recur). What are the causes? This condition happens when the sciatic nerve is pinched or has pressure put on it. This may be the result of:  A disk in between the bones of the spine bulging out too far (herniated disk).  Changes in the spinal disks that occur with aging.  A condition that affects a muscle in the butt.  Extra bone growth near the sciatic nerve.  A break (fracture) of the area between your hip bones (pelvis).  Pregnancy.  Tumor. This is rare. What increases the risk? You are more likely to develop this condition if you:  Play sports that put pressure or stress on the spine.  Have poor strength and ease of movement (flexibility).  Have had a back injury in the past.  Have had back surgery.  Sit for long periods of time.  Do activities that involve bending or lifting over and over again.  Are very overweight (obese). What are the signs or symptoms? Symptoms can vary from mild to very bad. They may include:  Any of these problems in the lower back, leg, hip, or butt: ? Mild tingling, loss of feeling, or dull aches. ? Burning sensations. ? Sharp pains.  Loss of feeling in the back of the calf or the sole of the foot.  Leg weakness.  Very bad back pain that makes it hard to move. These symptoms may get worse when you cough, sneeze, or laugh. They may also get worse when you sit or stand for long periods of time. How is this treated? This condition often gets better without any treatment. However, treatment may include:  Changing or cutting back on physical activity when you have pain.  Doing exercises and stretching.  Putting ice or heat on the affected area.  Medicines that  help: ? To relieve pain and swelling. ? To relax your muscles.  Shots (injections) of medicines that help to relieve pain, irritation, and swelling.  Surgery. Follow these instructions at home: Medicines  Take over-the-counter and prescription medicines only as told by your doctor.  Ask your doctor if the medicine prescribed to you: ? Requires you to avoid driving or using heavy machinery. ? Can cause trouble pooping (constipation). You may need to take these steps to prevent or treat trouble pooping:  Drink enough fluids to keep your pee (urine) pale yellow.  Take over-the-counter or prescription medicines.  Eat foods that are high in fiber. These include beans, whole grains, and fresh fruits and vegetables.  Limit foods that are high in fat and sugar. These include fried or sweet foods. Managing pain      If told, put ice on the affected area. ? Put ice in a plastic bag. ? Place a towel between your skin and the bag. ? Leave the ice on for 20 minutes, 2-3 times a day.  If told, put heat on the affected area. Use the heat source that your doctor tells you to use, such as a moist heat pack or a heating pad. ? Place a towel between your skin and the heat source. ? Leave the heat on for 20-30 minutes. ? Remove the heat if your skin turns bright red. This is very important if you are   unable to feel pain, heat, or cold. You may have a greater risk of getting burned. Activity   Return to your normal activities as told by your doctor. Ask your doctor what activities are safe for you.  Avoid activities that make your symptoms worse.  Take short rests during the day. ? When you rest for a long time, do some physical activity or stretching between periods of rest. ? Avoid sitting for a long time without moving. Get up and move around at least one time each hour.  Exercise and stretch regularly, as told by your doctor.  Do not lift anything that is heavier than 10 lb (4.5 kg)  while you have symptoms of sciatica. ? Avoid lifting heavy things even when you do not have symptoms. ? Avoid lifting heavy things over and over.  When you lift objects, always lift in a way that is safe for your body. To do this, you should: ? Bend your knees. ? Keep the object close to your body. ? Avoid twisting. General instructions  Stay at a healthy weight.  Wear comfortable shoes that support your feet. Avoid wearing high heels.  Avoid sleeping on a mattress that is too soft or too hard. You might have less pain if you sleep on a mattress that is firm enough to support your back.  Keep all follow-up visits as told by your doctor. This is important. Contact a doctor if:  You have pain that: ? Wakes you up when you are sleeping. ? Gets worse when you lie down. ? Is worse than the pain you have had in the past. ? Lasts longer than 4 weeks.  You lose weight without trying. Get help right away if:  You cannot control when you pee (urinate) or poop (have a bowel movement).  You have weakness in any of these areas and it gets worse: ? Lower back. ? The area between your hip bones. ? Butt. ? Legs.  You have redness or swelling of your back.  You have a burning feeling when you pee. Summary  Sciatica is pain, weakness, tingling, or loss of feeling (numbness) along the sciatic nerve.  This condition happens when the sciatic nerve is pinched or has pressure put on it.  Sciatica can cause pain, tingling, or loss of feeling (numbness) in the lower back, legs, hips, and butt.  Treatment often includes rest, exercise, medicines, and putting ice or heat on the affected area. This information is not intended to replace advice given to you by your health care provider. Make sure you discuss any questions you have with your health care provider. Document Revised: 05/26/2018 Document Reviewed: 05/26/2018 Elsevier Patient Education  2020 [employer] Pregabalin capsules What  is this medicine? PREGABALIN (pre GAB a lin) is used to treat nerve pain from diabetes, shingles, spinal cord injury, and fibromyalgia. It is also used to control seizures in epilepsy. This medicine may be used for other purposes; ask your health care provider or pharmacist if you have questions. COMMON BRAND NAME(S): Lyrica What should I tell my health care provider before I take this medicine? They need to know if you have any of these conditions:  heart disease  history of drug abuse or alcohol abuse problem  kidney disease  lung or breathing disease  suicidal thoughts, plans, or attempt; a previous suicide attempt by you or a family member  an unusual or allergic reaction to pregabalin, gabapentin, other medicines, foods, dyes, or preservatives  pregnant or trying  to get pregnant  breast-feeding How should I use this medicine? Take this medicine by mouth with a glass of water. Follow the directions on the prescription label. You can take it with or without food. If it upsets your stomach, take it with food. Take your medicine at regular intervals. Do not take it more often than directed. Do not stop taking except on your doctor's advice. A special MedGuide will be given to you by the pharmacist with each prescription and refill. Be sure to read this information carefully each time. Talk to your pediatrician regarding the use of this medicine in children. While this drug may be prescribed for children as young as 1 month for selected conditions, precautions do apply. Overdosage: If you think you have taken too much of this medicine contact a poison control center or emergency room at once. NOTE: This medicine is only for you. Do not share this medicine with others. What if I miss a dose? If you miss a dose, take it as soon as you can. If it is almost time for your next dose, take only that dose. Do not take double or extra doses. What may interact with this medicine? This medicine  may interact with the following medications:  alcohol  antihistamines for allergy, cough, and cold  certain medicines for anxiety or sleep  certain medicines for depression like amitriptyline, fluoxetine, sertraline  certain medicines for diabetes  certain medicines for seizures like phenobarbital, primidone  general anesthetics like halothane, isoflurane, methoxyflurane, propofol  local anesthetics like lidocaine, pramoxine, tetracaine  medicines that relax muscles for surgery  narcotic medicines for pain  phenothiazines like chlorpromazine, mesoridazine, prochlorperazine, thioridazine This list may not describe all possible interactions. Give your health care provider a list of all the medicines, herbs, non-prescription drugs, or dietary supplements you use. Also tell them if you smoke, drink alcohol, or use illegal drugs. Some items may interact with your medicine. What should I watch for while using this medicine? Tell your doctor or healthcare professional if your symptoms do not start to get better or if they get worse. Visit your doctor or health care professional for regular checks on your progress. Do not stop taking except on your doctor's advice. You may develop a severe reaction. Your doctor will tell you how much medicine to take. Wear a medical identification bracelet or chain if you are taking this medicine for seizures, and carry a card that describes your disease and details of your medicine and dosage times. You may get drowsy or dizzy. Do not drive, use machinery, or do anything that needs mental alertness until you know how this medicine affects you. Do not stand or sit up quickly, especially if you are an older patient. This reduces the risk of dizzy or fainting spells. Alcohol may interfere with the effect of this medicine. Avoid alcoholic drinks. If you have a heart condition, like congestive heart failure, and notice that you are retaining water and have swelling  in your hands or feet, contact your health care provider immediately. The use of this medicine may increase the chance of suicidal thoughts or actions. Pay special attention to how you are responding while on this medicine. Any worsening of mood, or thoughts of suicide or dying should be reported to your health care professional right away. This medicine has caused reduced sperm counts in some men. This may interfere with the ability to father a child. You should talk to your doctor or health care professional if you  are concerned about your fertility. Women who become pregnant while using this medicine for seizures may enroll in the [place] [employer] Antiepileptic Drug Pregnancy Registry by calling [phone]. This registry collects information about the safety of antiepileptic drug use during pregnancy. What side effects may I notice from receiving this medicine? Side effects that you should report to your doctor or health care professional as soon as possible:  allergic reactions like skin rash, itching or hives, swelling of the face, lips, or tongue  breathing problems  changes in vision  chest pain  confusion  jerking or unusual movements of any part of your body  loss of memory  muscle pain, tenderness, or weakness  suicidal thoughts or other mood changes  swelling of the ankles, feet, hands  unusual bruising or bleeding Side effects that usually do not require medical attention (report to your doctor or health care professional if they continue or are bothersome):  dizziness  drowsiness  dry mouth  headache  nausea  tremors  trouble sleeping  weight gain This list may not describe all possible side effects. Call your doctor for medical advice about side effects. You may report side effects to FDA at 1-800-FDA-1088. Where should I keep my medicine? Keep out of the reach of children. This medicine can be abused. Keep your medicine in a safe place to protect it  from theft. Do not share this medicine with anyone. Selling or giving away this medicine is dangerous and against the law. This medicine may cause accidental overdose and death if it taken by other adults, children, or pets. Mix any unused medicine with a substance like cat litter or coffee grounds. Then throw the medicine away in a sealed container like a sealed bag or a coffee can with a lid. Do not use the medicine after the expiration date. Store at room temperature between 15 and 30 degrees C (59 and 86 degrees F). NOTE: This sheet is a summary. It may not cover all possible information. If you have questions about this medicine, talk to your doctor, pharmacist, or health care provider.  2020 [employer]vier/Gold Standard (2018-05-09 13:15:55)

## 2020-01-15 NOTE — Progress Notes (Signed)
[facility]    Hospital Follow Up  Subjective:  Patient ID: Leonard Ramos, male    DOB: 1955-10-26  Age: 64 y.o. MRN: [id number]  CC:  Chief Complaint  Patient presents with  . Follow-up    Pt has some concerns about his L side and L leg.. Pt states both hands, feet  are numb  . Back Pain    X 3-4 MONTHS    HPI Leonard Ramos is a 64 year old male who presents for Follow Up today.   Patient Active Problem List   Diagnosis Date Noted  . Tobacco use 07/12/2019  . History of hyperglycemia 07/12/2019  . Erectile dysfunction 07/12/2019  . Chronic left-sided low back pain without sciatica 04/10/2019  . Alcohol use 04/10/2019  . Poor dentition 04/10/2019  . Chronic dental pain 04/10/2019  . Urinary frequency 04/10/2019  . Aortic atherosclerosis (HCC) 03/19/2017  . Generalized anxiety disorder 10/07/2015  . Depression 10/07/2015  . Neuropathy 09/06/2015  . Loss of weight 09/06/2015  . Tobacco dependence 09/06/2015  . Bowel incontinence 03/09/2015  . ETOH abuse 03/09/2015  . Left sided numbness 03/08/2015  . Left-sided weakness 03/08/2015  . Absence of bladder continence 03/08/2015    Past Medical History:  Diagnosis Date  . Alcohol use   . Anxiety   . Hyperlipidemia   . Liver disease   . Neuropathy   . Periodontitis, chronic, generalized   . Tobacco use   . Vitamin D deficiency 03/2019    Past Surgical History:  Procedure Laterality Date  . MULTIPLE EXTRACTIONS WITH ALVEOLOPLASTY Bilateral 09/04/2019   Procedure: MULTIPLE EXTRACTION W/ ALVEOLOPLASTY, Removal of left maxillary buccal exostosis;  Surgeon: Ocie Doyne, DDS;  Location: [facility] OR;  Service: Oral Surgery;  Laterality: Bilateral;  . NO PAST SURGERIES      Family History  Family history unknown: Yes    Current Status: Since his last office visit,  he has c/o increased chronic left leg pain and weakness today. He was recently in the ED on 01/07/2020  for Left Leg pain and weakness. He recently had multiple tooth extractions and is currently scheduled for 01/21/2020 for oral surgery for denture fitting. He is currently drinks 15 alcoholic beverages daily. He denies fevers, chills, fatigue, recent infections, weight loss, and night sweats. He has not had any headaches, visual changes, dizziness, and falls. No chest pain, heart palpitations, cough and shortness of breath reported. Denies GI problems such as nausea, vomiting, diarrhea, and constipation. He has no reports of blood in stools, dysuria and hematuria. He has moderate anxiety today r/t his pain. He is taking all medications as prescribed.   Social History   Socioeconomic History  . Marital status: Single    Spouse name: Not on file  . Number of children: 4  . Years of education: Not on file  . Highest education level: Not on file  Occupational History  . Not on file  Tobacco Use  . Smoking status: Current Every Day Smoker    Packs/day: 1.00    Types: Cigarettes  . Smokeless tobacco: Never Used  Vaping Use  . Vaping Use: Never used  Substance and Sexual Activity  . Alcohol use: Yes    Alcohol/week: 40.0 standard drinks    Types: 40 Standard drinks or equivalent per week    Comment: 1-2 daily  . Drug use: Yes    Types: Marijuana    Comment: last use 09/02/2019  .  Sexual activity: Yes    Partners: Female  Other Topics Concern  . Not on file  Social History Narrative  . Not on file   Social Determinants of Health   Financial Resource Strain:   . Difficulty of Paying Living Expenses: Not on file  Food Insecurity:   . Worried About Programme researcher, broadcasting/film/video in the Last Year: Not on file  . Ran Out of Food in the Last Year: Not on file  Transportation Needs:   . Lack of Transportation (Medical): Not on file  . Lack of Transportation (Non-Medical): Not on file  Physical Activity:   . Days of Exercise per Week: Not on file  . Minutes of Exercise per Session: Not on file    Stress:   . Feeling of Stress : Not on file  Social Connections:   . Frequency of Communication with Friends and Family: Not on file  . Frequency of Social Gatherings with Friends and Family: Not on file  . Attends Religious Services: Not on file  . Active Member of Clubs or Organizations: Not on file  . Attends Banker Meetings: Not on file  . Marital Status: Not on file  Intimate Partner Violence:   . Fear of Current or Ex-Partner: Not on file  . Emotionally Abused: Not on file  . Physically Abused: Not on file  . Sexually Abused: Not on file    Outpatient Medications Prior to Visit  Medication Sig Dispense Refill  . aspirin 81 MG chewable tablet Chew 81 mg by mouth daily.    . busPIRone (BUSPAR) 10 MG tablet Take 1 tablet (10 mg total) by mouth 2 (two) times daily. 60 tablet 6  . Carboxymethylcellul-Glycerin (LUBRICATING EYE DROPS OP) Place 1 drop into both eyes daily as needed (dry eyes).    . chlorhexidine (PERIDEX) 0.12 % solution Use as directed 15 mLs in the mouth or throat daily. 473 mL 6  . FLUoxetine (PROZAC) 40 MG capsule Take 40 mg by mouth daily.    . folic acid (FOLVITE) 1 MG tablet Take 1 tablet (1 mg total) by mouth daily. 30 tablet 6  . gabapentin (NEURONTIN) 800 MG tablet TAKE 1 TABLET (800 MG TOTAL) BY MOUTH 3 (THREE) TIMES DAILY. 90 tablet 2  . pravastatin (PRAVACHOL) 20 MG tablet Take 1 tablet (20 mg total) by mouth daily. 30 tablet 6  . thiamine (VITAMIN B-1) 100 MG tablet Take 1 tablet (100 mg total) by mouth daily. 30 tablet 6  . Vitamin D, Ergocalciferol, (DRISDOL) 1.25 MG (50000 UT) CAPS capsule Take 1 capsule (50,000 Units total) by mouth every 7 (seven) days. (Patient taking differently: Take 50,000 Units by mouth every Wednesday. ) 5 capsule 6  . oxyCODONE-acetaminophen (PERCOCET) 5-325 MG tablet Take 1 tablet by mouth every 4 (four) hours as needed. 30 tablet 0  . predniSONE (STERAPRED UNI-PAK 21 TAB) 10 MG (21) TBPK tablet Take by mouth  daily. Take 6 tabs by mouth daily  for 2 days, then 5 tabs for 2 days, then 4 tabs for 2 days, then 3 tabs for 2 days, 2 tabs for 2 days, then 1 tab by mouth daily for 2 days 42 tablet 0  . lidocaine (LIDODERM) 5 % Place 1 patch onto the skin daily. Remove & Discard patch within 12 hours or as directed by MD (Patient not taking: Reported on 01/15/2020) 30 patch 0  . sildenafil (VIAGRA) 50 MG tablet Take 1 tablet (50 mg total) by mouth daily as  needed for erectile dysfunction. (Patient not taking: Reported on 01/15/2020) 10 tablet 0  . amoxicillin (AMOXIL) 500 MG capsule Take 1 capsule (500 mg total) by mouth 3 (three) times daily. (Patient not taking: Reported on 01/15/2020) 21 capsule 0   No facility-administered medications prior to visit.    No Known Allergies  ROS Review of Systems  Constitutional: Negative.   HENT: Positive for dental problem (multiple tooth extractions lately).   Eyes: Negative.   Respiratory: Negative.   Cardiovascular: Negative.   Gastrointestinal: Negative.   Endocrine: Negative.   Genitourinary: Negative.   Musculoskeletal: Positive for arthralgias (generalized joint pain) and back pain (chronic back pain).       Chronic left leg pain r/t neuropathy  Skin: Negative.   Allergic/Immunologic: Negative.   Neurological: Positive for headaches (occasional ).  Hematological: Negative.   Psychiatric/Behavioral: Negative.       Objective:    Physical Exam Vitals and nursing note reviewed.  Constitutional:      Appearance: Normal appearance.  HENT:     Head: Normocephalic and atraumatic.     Nose: Nose normal.     Mouth/Throat:     Mouth: Mucous membranes are moist.     Pharynx: Oropharynx is clear.  Cardiovascular:     Rate and Rhythm: Normal rate and regular rhythm.     Pulses: Normal pulses.     Heart sounds: Normal heart sounds.  Pulmonary:     Effort: Pulmonary effort is normal.     Breath sounds: Normal breath sounds.  Abdominal:     General:  Bowel sounds are normal.     Palpations: Abdomen is soft.  Musculoskeletal:        General: Normal range of motion.     Cervical back: Normal range of motion and neck supple.  Skin:    General: Skin is warm and dry.  Neurological:     General: No focal deficit present.     Mental Status: He is alert and oriented to person, place, and time.  Psychiatric:        Mood and Affect: Mood normal.        Behavior: Behavior normal.        Thought Content: Thought content normal.        Judgment: Judgment normal.     BP 105/75 (BP Location: Left Arm, Patient Position: Sitting, Cuff Size: Normal)   Pulse 86   Temp (!) 97.3 F (36.3 C)   Resp 17   Ht 5\' 11"  (1.803 m)   Wt 146 lb 3.2 oz (66.3 kg)   SpO2 100%   BMI 20.39 kg/m  Wt Readings from Last 3 Encounters:  01/15/20 146 lb 3.2 oz (66.3 kg)  09/04/19 164 lb (74.4 kg)  07/10/19 166 lb 3.2 oz (75.4 kg)     Health Maintenance Due  Topic Date Due  . COLONOSCOPY  Never done  . COVID-19 Vaccine (2 - Moderna 2-dose series) 10/26/2019    There are no preventive care reminders to display for this patient.  Lab Results  Component Value Date   TSH 0.999 12/05/2017   Lab Results  Component Value Date   WBC 4.4 01/07/2020   HGB 18.0 (H) 01/07/2020   HCT 53.0 (H) 01/07/2020   MCV 92.2 01/07/2020   PLT 205 01/07/2020   Lab Results  Component Value Date   NA 139 01/07/2020   K 3.9 01/07/2020   CO2 22 01/07/2020   GLUCOSE 90 01/07/2020   BUN 6 (  L) 01/07/2020   CREATININE 0.90 01/07/2020   BILITOT 0.7 01/07/2020   ALKPHOS 66 01/07/2020   AST 67 (H) 01/07/2020   ALT 49 (H) 01/07/2020   PROT 6.8 01/07/2020   ALBUMIN 3.9 01/07/2020   CALCIUM 9.2 01/07/2020   ANIONGAP 14 01/07/2020   GFR 103.47 04/29/2019   Lab Results  Component Value Date   CHOL 190 01/08/2019   Lab Results  Component Value Date   HDL 85 01/08/2019   Lab Results  Component Value Date   LDLCALC 96 01/08/2019   Lab Results  Component Value Date     TRIG 44 01/08/2019   Lab Results  Component Value Date   CHOLHDL 2.2 01/08/2019   Lab Results  Component Value Date   HGBA1C 5.5 04/10/2019      Assessment & Plan:   1. Chronic left-sided low back pain without sciatica - Urinalysis Dipstick - pregabalin (LYRICA) 25 MG capsule; Take 1 capsule (25 mg total) by mouth 2 (two) times daily.  Dispense: 60 capsule; Refill: 3  2. Generalized anxiety disorder - pregabalin (LYRICA) 25 MG capsule; Take 1 capsule (25 mg total) by mouth 2 (two) times daily.  Dispense: 60 capsule; Refill: 3  3. Tobacco use He will contact office if he needs assistance with smoking cessation.  4. History of tooth extraction, unspecified edentulism class Multiple extractions. Currently awaiting for denture placement.   5. Poor dentition  6. Alcohol use  7. Follow up He will follow up in 3 months.   Meds ordered this encounter  Medications  . pregabalin (LYRICA) 25 MG capsule    Sig: Take 1 capsule (25 mg total) by mouth 2 (two) times daily.    Dispense:  60 capsule    Refill:  3    Orders Placed This Encounter  Procedures  . Urinalysis Dipstick    Referral Orders  No referral(s) requested today    Raliegh IpNatalie Lajuan Kovaleski,  MSN, FNP-BC [facility] [address]  [place], [place] [id number] [phone]    Problem List Items Addressed This Visit      Digestive   Poor dentition     Other   Alcohol use   Chronic left-sided low back pain without sciatica - Primary   Relevant Medications   pregabalin (LYRICA) 25 MG capsule   Other Relevant Orders   Urinalysis Dipstick (Completed)   Generalized anxiety disorder   Relevant Medications   pregabalin (LYRICA) 25 MG capsule   Tobacco use    Other Visit Diagnoses    History of tooth extraction, unspecified edentulism class       Follow up          Meds ordered this encounter   Medications  . pregabalin (LYRICA) 25 MG capsule    Sig: Take 1 capsule (25 mg total) by mouth 2 (two) times daily.    Dispense:  60 capsule    Refill:  3    Follow-up: Return in about 3 months (around 04/16/2020).    Kallie LocksNatalie M Rhina Kramme, FNP

## 2020-01-15 NOTE — Telephone Encounter (Signed)
Pt was call VM was left for Pt to call and schedule HFU appointment

## 2020-01-21 ENCOUNTER — Encounter: Payer: Self-pay | Admitting: Family Medicine

## 2020-02-29 MED FILL — GABAPENTIN 800 MG TABLET: 800 | 30 days supply | Qty: 90 | Fill #5

## 2020-03-21 MED FILL — CHLORHEXIDINE 0.12% RINSE: 0.12 | 30 days supply | Qty: 473 | Fill #2

## 2020-04-18 ENCOUNTER — Ambulatory Visit (INDEPENDENT_AMBULATORY_CARE_PROVIDER_SITE_OTHER): Payer: Medicare Other | Admitting: Family Medicine

## 2020-04-18 ENCOUNTER — Other Ambulatory Visit: Payer: Self-pay | Admitting: Family Medicine

## 2020-04-18 ENCOUNTER — Other Ambulatory Visit: Payer: Self-pay

## 2020-04-18 ENCOUNTER — Encounter: Payer: Self-pay | Admitting: Family Medicine

## 2020-04-18 VITALS — BP 146/96 | HR 71 | Temp 96.3°F | Ht 71.0 in | Wt 146.0 lb

## 2020-04-18 DIAGNOSIS — Z0000 Encounter for general adult medical examination without abnormal findings: Secondary | ICD-10-CM | POA: Diagnosis not present

## 2020-04-18 DIAGNOSIS — K089 Disorder of teeth and supporting structures, unspecified: Secondary | ICD-10-CM

## 2020-04-18 DIAGNOSIS — F411 Generalized anxiety disorder: Secondary | ICD-10-CM | POA: Diagnosis not present

## 2020-04-18 DIAGNOSIS — Z09 Encounter for follow-up examination after completed treatment for conditions other than malignant neoplasm: Secondary | ICD-10-CM

## 2020-04-18 DIAGNOSIS — Z7289 Other problems related to lifestyle: Secondary | ICD-10-CM

## 2020-04-18 DIAGNOSIS — G629 Polyneuropathy, unspecified: Secondary | ICD-10-CM | POA: Diagnosis not present

## 2020-04-18 DIAGNOSIS — F1090 Alcohol use, unspecified, uncomplicated: Secondary | ICD-10-CM

## 2020-04-18 DIAGNOSIS — Z760 Encounter for issue of repeat prescription: Secondary | ICD-10-CM

## 2020-04-18 DIAGNOSIS — Z720 Tobacco use: Secondary | ICD-10-CM

## 2020-04-18 DIAGNOSIS — M545 Low back pain, unspecified: Secondary | ICD-10-CM

## 2020-04-18 DIAGNOSIS — G8929 Other chronic pain: Secondary | ICD-10-CM

## 2020-04-18 DIAGNOSIS — Z1211 Encounter for screening for malignant neoplasm of colon: Secondary | ICD-10-CM

## 2020-04-18 DIAGNOSIS — R200 Anesthesia of skin: Secondary | ICD-10-CM

## 2020-04-18 DIAGNOSIS — F32A Depression, unspecified: Secondary | ICD-10-CM

## 2020-04-18 DIAGNOSIS — I700 Atherosclerosis of aorta: Secondary | ICD-10-CM

## 2020-04-18 DIAGNOSIS — Z789 Other specified health status: Secondary | ICD-10-CM

## 2020-04-18 MED ORDER — GABAPENTIN 600 MG PO TABS: 600.0000 mg | ORAL_TABLET | Freq: Three times a day (TID) | ORAL | 3 refills | Status: DC

## 2020-04-18 MED ORDER — DICLOFENAC SODIUM 1 % EX GEL: 4.0000 g | Freq: Four times a day (QID) | CUTANEOUS | 6 refills | Status: DC

## 2020-04-18 MED ORDER — FOLIC ACID 1 MG PO TABS: 1.0000 mg | ORAL_TABLET | Freq: Every day | ORAL | 3 refills | Status: AC

## 2020-04-18 MED ORDER — PREGABALIN 25 MG PO CAPS: 25.0000 mg | ORAL_CAPSULE | Freq: Two times a day (BID) | ORAL | 6 refills | Status: DC

## 2020-04-18 MED ORDER — SILDENAFIL CITRATE 100 MG PO TABS: 100.0000 mg | ORAL_TABLET | Freq: Every day | ORAL | 3 refills | Status: DC | PRN

## 2020-04-18 MED ORDER — PRAVASTATIN SODIUM 20 MG PO TABS: 20.0000 mg | ORAL_TABLET | Freq: Every day | ORAL | 3 refills | Status: DC

## 2020-04-18 MED ORDER — BUSPIRONE HCL 10 MG PO TABS: 10.0000 mg | ORAL_TABLET | Freq: Two times a day (BID) | ORAL | 3 refills | Status: DC

## 2020-04-18 MED ORDER — FLUOXETINE HCL 40 MG PO CAPS: 40.0000 mg | ORAL_CAPSULE | Freq: Every day | ORAL | 3 refills | Status: DC

## 2020-04-18 MED FILL — PREGABALIN 25 MG CAPS: 25 | 30 days supply | Qty: 60 | Fill #0

## 2020-04-18 MED FILL — DICLOFENAC SODIUM 1% GEL: 1 | 6 days supply | Qty: 100 | Fill #0

## 2020-04-18 MED FILL — PRAVASTATIN SODIUM 20 MG TA: 20 | 90 days supply | Qty: 90 | Fill #0

## 2020-04-18 MED FILL — busPIRone HCL 10 MG TABS: 10 | 90 days supply | Qty: 180 | Fill #0

## 2020-04-18 MED FILL — FLUoxetine HCL 40 MG CAPS: 40 | 90 days supply | Qty: 90 | Fill #0

## 2020-04-18 NOTE — Patient Instructions (Signed)
Diclofenac skin gel What is this medicine? DICLOFENAC (dye KLOE fen ak) is a non-steroidal anti-inflammatory drug (NSAID). The 1% skin gel is used to treat osteoarthritis of the hands or knees. The 3% skin gel is used to treat actinic keratosis. This medicine may be used for other purposes; ask your health care provider or pharmacist if you have questions. COMMON BRAND NAME(S): DSG Pak, Omeca, Solaravix, Solaraze, Voltaren Arthritis, Voltaren Gel What should I tell my health care provider before I take this medicine? They need to know if you have any of these conditions:  asthma  bleeding problems  coronary artery bypass graft (CABG) surgery within the past 2 weeks  heart disease  high blood pressure  if you frequently drink alcohol containing drinks  kidney disease  liver disease  open or infected skin  stomach problems  an unusual or allergic reaction to diclofenac, aspirin, other NSAIDs, other medicines, benzyl alcohol (3% gel only), foods, dyes, or preservatives  pregnant or trying to get pregnant  breast-feeding How should I use this medicine? This medicine is for external use only. Follow the directions on the prescription label. Wash hands before and after use. Do not get this medicine in your eyes. If you do, rinse out with plenty of cool tap water. Use your doses at regular intervals. Do not use your medicine more often than directed. A special MedGuide will be given to you by the pharmacist with each prescription and refill of the 1% gel. Be sure to read this information carefully each time. Talk to your pediatrician regarding the use of this medicine in children. Special care may be needed. The 3% gel is not approved for use in children. Overdosage: If you think you have taken too much of this medicine contact a poison control center or emergency room at once. NOTE: This medicine is only for you. Do not share this medicine with others. What if I miss a dose? If you  miss a dose, use it as soon as you can. If it is almost time for your next dose, use only that dose. Do not use double or extra doses. What may interact with this medicine?  aspirin  NSAIDs, medicines for pain and inflammation, like ibuprofen or naproxen Do not use any other skin products without telling your doctor or health care professional. This list may not describe all possible interactions. Give your health care provider a list of all the medicines, herbs, non-prescription drugs, or dietary supplements you use. Also tell them if you smoke, drink alcohol, or use illegal drugs. Some items may interact with your medicine. What should I watch for while using this medicine? Tell your doctor or healthcare provider if your symptoms do not start to get better or if they get worse. You will need to follow up with your healthcare provider to monitor your progress. You may need to be treated for up to 3 months if you are using the 3% gel, but the full effect may not occur until 1 month after stopping treatment. If you develop a severe skin reaction, contact your doctor or healthcare provider immediately. This medicine may cause serious skin reactions. They can happen weeks to months after starting the medicine. Contact your healthcare provider right away if you notice fevers or flu-like symptoms with a rash. The rash may be red or purple and then turn into blisters or peeling of the skin. Or, you might notice a red rash with swelling of the face, lips or lymph nodes in   your neck or under your arms. This medicine can make you more sensitive to the sun. Keep out of the sun. If you cannot avoid being in the sun, wear protective clothing and use sunscreen. Do not use sun lamps or tanning beds/booths. Do not take medicines such as ibuprofen and naproxen with this medicine. Side effects such as stomach upset, nausea, or ulcers may be more likely to occur. Many medicines available without a prescription should not  be taken with this medicine. This medicine does not prevent heart attack or stroke. In fact, this medicine may increase the chance of a heart attack or stroke. The chance may increase with longer use of this medicine and in people who have heart disease. If you take aspirin to prevent heart attack or stroke, talk with your doctor or healthcare provider. This medicine can cause ulcers and bleeding in the stomach and intestines at any time during treatment. Do not smoke cigarettes or drink alcohol. These increase irritation to your stomach and can make it more susceptible to damage from this medicine. Ulcers and bleeding can happen without warning symptoms and can cause death. You may get drowsy or dizzy. Do not drive, use machinery, or do anything that needs mental alertness until you know how this medicine affects you. Do not stand or sit up quickly, especially if you are an older patient. This reduces the risk of dizzy or fainting spells. This medicine can cause you to bleed more easily. Try to avoid damage to your teeth and gums when you brush or floss your teeth. What side effects may I notice from receiving this medicine? Side effects that you should report to your doctor or health care professional as soon as possible:  allergic reactions like skin rash, itching or hives, swelling of the face, lips, or tongue  black or bloody stools, blood in the urine or vomit  blurred vision  chest pain  difficulty breathing or wheezing  nausea or vomiting  rash, fever, and swollen lymph nodes  redness, blistering, peeling or loosening of the skin, including inside the mouth  slurred speech or weakness on one side of the body  trouble passing urine or change in the amount of urine  unexplained weight gain or swelling  unusually weak or tired  yellowing of eyes or skin Side effects that usually do not require medical attention (report to your doctor or health care professional if they continue  or are bothersome):  dizziness  dry skin  headache  heartburn  increased sensitivity to the sun  stomach pain  tingling at the application site This list may not describe all possible side effects. Call your doctor for medical advice about side effects. You may report side effects to FDA at 1-800-FDA-1088. Where should I keep my medicine? Keep out of the reach of children. Store the 1% gel at room temperature between 15 and 30 degrees C (59 and 86 degrees F). Store the 3% gel at room temperature between 20 and 25 degrees C (68 and 77 degrees F). Protect from light. Throw away any unused medicine after the expiration date. NOTE: This sheet is a summary. It may not cover all possible information. If you have questions about this medicine, talk to your doctor, pharmacist, or health care provider.  2020 Elsevier/Gold Standard (2018-07-23 13:05:18)  

## 2020-04-18 NOTE — Progress Notes (Signed)
[facility]   Annual Physical  Subjective:  Patient ID: Leonard Ramos, male    DOB: 12-07-1955  Age: 64 y.o. MRN: [id number]  CC:  Chief Complaint  Patient presents with  . Follow-up    Pt states no gilling in his both hands. X3 months.    HPI Leonard Ramos is a 64 year old male who presents for his Annual Physical today.    Patient Active Problem List   Diagnosis Date Noted  . Tobacco use 07/12/2019  . History of hyperglycemia 07/12/2019  . Erectile dysfunction 07/12/2019  . Chronic left-sided low back pain without sciatica 04/10/2019  . Alcohol use 04/10/2019  . Poor dentition 04/10/2019  . Chronic dental pain 04/10/2019  . Urinary frequency 04/10/2019  . Aortic atherosclerosis (HCC) 03/19/2017  . Generalized anxiety disorder 10/07/2015  . Depression 10/07/2015  . Neuropathy 09/06/2015  . Loss of weight 09/06/2015  . Tobacco dependence 09/06/2015  . Bowel incontinence 03/09/2015  . ETOH abuse 03/09/2015  . Left sided numbness 03/08/2015  . Left-sided weakness 03/08/2015  . Absence of bladder continence 03/08/2015    Current Status: Since his last office visit, he has c/o chronic left-sided low back pain, which he is taking Gabapentin for minimal relief. He also has c/o left extremity numbness and tingling X 3 months now. He states that he had trouble using his left hand.  He states that he had trouble feeling his left foot and toes. She denies fevers, chills, fatigue, recent infections, weight loss, and night sweats. She has not had any headaches, visual changes, dizziness, and falls. No chest pain, heart palpitations, cough and shortness of breath reported. Denies GI problems such as nausea, vomiting, diarrhea, and constipation. She has no reports of blood in stools, dysuria and hematuria. No depression or anxiety reported today. She is taking all medications as prescribed.    Past Medical History:  Diagnosis  Date  . Alcohol use   . Anxiety   . Hyperlipidemia   . Liver disease   . Neuropathy   . Periodontitis, chronic, generalized   . Tobacco use   . Vitamin D deficiency 03/2019    Past Surgical History:  Procedure Laterality Date  . MULTIPLE EXTRACTIONS WITH ALVEOLOPLASTY Bilateral 09/04/2019   Procedure: MULTIPLE EXTRACTION W/ ALVEOLOPLASTY, Removal of left maxillary buccal exostosis;  Surgeon: Ocie Doyne, DDS;  Location: [facility] OR;  Service: Oral Surgery;  Laterality: Bilateral;  . NO PAST SURGERIES      Family History  Family history unknown: Yes    Social History   Socioeconomic History  . Marital status: Single    Spouse name: Not on file  . Number of children: 4  . Years of education: Not on file  . Highest education level: Not on file  Occupational History  . Not on file  Tobacco Use  . Smoking status: Current Every Day Smoker    Packs/day: 1.00    Types: Cigarettes  . Smokeless tobacco: Never Used  Vaping Use  . Vaping Use: Never used  Substance and Sexual Activity  . Alcohol use: Yes    Alcohol/week: 40.0 standard drinks    Types: 40 Standard drinks or equivalent per week    Comment: 1-2 daily  . Drug use: Yes    Types: Marijuana    Comment: last use 09/02/2019  . Sexual activity: Yes    Partners: Female  Other Topics Concern  . Not on file  Social History  Narrative  . Not on file   Social Determinants of Health   Financial Resource Strain:   . Difficulty of Paying Living Expenses: Not on file  Food Insecurity:   . Worried About Programme researcher, broadcasting/film/videounning Out of Food in the Last Year: Not on file  . Ran Out of Food in the Last Year: Not on file  Transportation Needs:   . Lack of Transportation (Medical): Not on file  . Lack of Transportation (Non-Medical): Not on file  Physical Activity:   . Days of Exercise per Week: Not on file  . Minutes of Exercise per Session: Not on file  Stress:   . Feeling of Stress : Not on file  Social Connections:   . Frequency of  Communication with Friends and Family: Not on file  . Frequency of Social Gatherings with Friends and Family: Not on file  . Attends Religious Services: Not on file  . Active Member of Clubs or Organizations: Not on file  . Attends BankerClub or Organization Meetings: Not on file  . Marital Status: Not on file  Intimate Partner Violence:   . Fear of Current or Ex-Partner: Not on file  . Emotionally Abused: Not on file  . Physically Abused: Not on file  . Sexually Abused: Not on file    Outpatient Medications Prior to Visit  Medication Sig Dispense Refill  . aspirin 81 MG chewable tablet Chew 81 mg by mouth daily.    . Carboxymethylcellul-Glycerin (LUBRICATING EYE DROPS OP) Place 1 drop into both eyes daily as needed (dry eyes).    . chlorhexidine (PERIDEX) 0.12 % solution Use as directed 15 mLs in the mouth or throat daily. 473 mL 6  . lidocaine (LIDODERM) 5 % Place 1 patch onto the skin daily. Remove & Discard patch within 12 hours or as directed by MD (Patient not taking: Reported on 01/15/2020) 30 patch 0  . thiamine (VITAMIN B-1) 100 MG tablet Take 1 tablet (100 mg total) by mouth daily. 30 tablet 6  . Vitamin D, Ergocalciferol, (DRISDOL) 1.25 MG (50000 UT) CAPS capsule Take 1 capsule (50,000 Units total) by mouth every 7 (seven) days. (Patient taking differently: Take 50,000 Units by mouth every Wednesday. ) 5 capsule 6  . busPIRone (BUSPAR) 10 MG tablet Take 1 tablet (10 mg total) by mouth 2 (two) times daily. 60 tablet 6  . FLUoxetine (PROZAC) 40 MG capsule Take 40 mg by mouth daily.    . folic acid (FOLVITE) 1 MG tablet Take 1 tablet (1 mg total) by mouth daily. 30 tablet 6  . gabapentin (NEURONTIN) 800 MG tablet TAKE 1 TABLET (800 MG TOTAL) BY MOUTH 3 (THREE) TIMES DAILY. 90 tablet 2  . pravastatin (PRAVACHOL) 20 MG tablet Take 1 tablet (20 mg total) by mouth daily. 30 tablet 6  . pregabalin (LYRICA) 25 MG capsule Take 1 capsule (25 mg total) by mouth 2 (two) times daily. 60 capsule 3  .  sildenafil (VIAGRA) 50 MG tablet Take 1 tablet (50 mg total) by mouth daily as needed for erectile dysfunction. (Patient not taking: Reported on 01/15/2020) 10 tablet 0   No facility-administered medications prior to visit.    No Known Allergies  ROS Review of Systems  Constitutional: Negative.   HENT: Positive for dental problem (caries).   Eyes: Negative.   Respiratory: Negative.   Cardiovascular: Negative.   Gastrointestinal: Negative.   Endocrine: Negative.   Genitourinary: Negative.   Musculoskeletal: Negative.   Allergic/Immunologic: Negative.   Neurological: Positive for dizziness (  occcasional), numbness (left extremity) and headaches (occasional ).  Hematological: Negative.   Psychiatric/Behavioral: Negative.       Objective:    Physical Exam Vitals and nursing note reviewed.  Constitutional:      Appearance: Normal appearance.  HENT:     Head: Normocephalic and atraumatic.  Cardiovascular:     Rate and Rhythm: Normal rate and regular rhythm.     Pulses: Normal pulses.     Heart sounds: Normal heart sounds.  Pulmonary:     Effort: Pulmonary effort is normal.     Breath sounds: Normal breath sounds.  Abdominal:     General: Bowel sounds are normal.     Palpations: Abdomen is soft.  Musculoskeletal:        General: Normal range of motion.     Cervical back: Normal range of motion and neck supple.  Skin:    General: Skin is warm and dry.  Neurological:     Mental Status: He is alert.  Psychiatric:        Mood and Affect: Mood normal.        Behavior: Behavior normal.        Thought Content: Thought content normal.        Judgment: Judgment normal.    BP (!) 151/81 (BP Location: Left Arm, Patient Position: Sitting, Cuff Size: Normal)   Pulse 71   Temp (!) 96.3 F (35.7 C)   Ht  (1.803 m)   Wt 146 lb (66.2 kg)   BMI 20.36 kg/m  Wt Readings from Last 3 Encounters:  04/18/20 146 lb (66.2 kg)  01/15/20 146 lb 3.2 oz (66.3 kg)  09/04/19 164 lb  (74.4 kg)     Health Maintenance Due  Topic Date Due  . COLONOSCOPY  Never done  . COVID-19 Vaccine (2 - Moderna 2-dose series) 10/26/2019    There are no preventive care reminders to display for this patient.  Lab Results  Component Value Date   TSH 0.999 12/05/2017   Lab Results  Component Value Date   WBC 4.4 01/07/2020   HGB 18.0 (H) 01/07/2020   HCT 53.0 (H) 01/07/2020   MCV 92.2 01/07/2020   PLT 205 01/07/2020   Lab Results  Component Value Date   NA 139 01/07/2020   K 3.9 01/07/2020   CO2 22 01/07/2020   GLUCOSE 90 01/07/2020   BUN 6 (L) 01/07/2020   CREATININE 0.90 01/07/2020   BILITOT 0.7 01/07/2020   ALKPHOS 66 01/07/2020   AST 67 (H) 01/07/2020   ALT 49 (H) 01/07/2020   PROT 6.8 01/07/2020   ALBUMIN 3.9 01/07/2020   CALCIUM 9.2 01/07/2020   ANIONGAP 14 01/07/2020   GFR 103.47 04/29/2019   Lab Results  Component Value Date   CHOL 190 01/08/2019   Lab Results  Component Value Date   HDL 85 01/08/2019   Lab Results  Component Value Date   LDLCALC 96 01/08/2019   Lab Results  Component Value Date   TRIG 44 01/08/2019   Lab Results  Component Value Date   CHOLHDL 2.2 01/08/2019   Lab Results  Component Value Date   HGBA1C 5.5 04/10/2019      Assessment & Plan:   1. Chronic left-sided low back pain without sciatica - diclofenac Sodium (VOLTAREN) 1 % GEL; Apply 4 g topically 4 (four) times daily.  Dispense: 100 g; Refill: 6 - pregabalin (LYRICA) 25 MG capsule; Take 1 capsule (25 mg total) by mouth 2 (two) times daily.  Dispense: 60 capsule; Refill: 6  2. Generalized anxiety disorder - busPIRone (BUSPAR) 10 MG tablet; Take 1 tablet (10 mg total) by mouth 2 (two) times daily.  Dispense: 180 tablet; Refill: 3 - pregabalin (LYRICA) 25 MG capsule; Take 1 capsule (25 mg total) by mouth 2 (two) times daily.  Dispense: 60 capsule; Refill: 6  3. Poor dentition Dentures.   4. Neuropathy  5. Screening for colon cancer Declines today.   6.  Numbness of left lower extremity - Ambulatory referral to Neurology  7. Depression, unspecified depression type - busPIRone (BUSPAR) 10 MG tablet; Take 1 tablet (10 mg total) by mouth 2 (two) times daily.  Dispense: 180 tablet; Refill: 3  8. Aortic atherosclerosis (HCC) - pravastatin (PRAVACHOL) 20 MG tablet; Take 1 tablet (20 mg total) by mouth daily.  Dispense: 90 tablet; Refill: 3  9. Alcohol use  10. Tobacco use  11. Medication refill - busPIRone (BUSPAR) 10 MG tablet; Take 1 tablet (10 mg total) by mouth 2 (two) times daily.  Dispense: 180 tablet; Refill: 3 - FLUoxetine (PROZAC) 40 MG capsule; Take 1 capsule (40 mg total) by mouth daily.  Dispense: 90 capsule; Refill: 3 - folic acid (FOLVITE) 1 MG tablet; Take 1 tablet (1 mg total) by mouth daily.  Dispense: 90 tablet; Refill: 3 - pravastatin (PRAVACHOL) 20 MG tablet; Take 1 tablet (20 mg total) by mouth daily.  Dispense: 90 tablet; Refill: 3 - pregabalin (LYRICA) 25 MG capsule; Take 1 capsule (25 mg total) by mouth 2 (two) times daily.  Dispense: 60 capsule; Refill: 6 - gabapentin (NEURONTIN) 600 MG tablet; Take 1 tablet (600 mg total) by mouth 3 (three) times daily.  Dispense: 270 tablet; Refill: 3  12. Health care maintenance - CBC with Differential - Comprehensive metabolic panel - Lipid Panel - TSH - PSA - Vitamin B12 - Vitamin D, 25-hydroxy  13. Follow up He will follow up in 6 months.   Meds ordered this encounter  Medications  . diclofenac Sodium (VOLTAREN) 1 % GEL    Sig: Apply 4 g topically 4 (four) times daily.    Dispense:  100 g    Refill:  6  . busPIRone (BUSPAR) 10 MG tablet    Sig: Take 1 tablet (10 mg total) by mouth 2 (two) times daily.    Dispense:  180 tablet    Refill:  3  . FLUoxetine (PROZAC) 40 MG capsule    Sig: Take 1 capsule (40 mg total) by mouth daily.    Dispense:  90 capsule    Refill:  3  . folic acid (FOLVITE) 1 MG tablet    Sig: Take 1 tablet (1 mg total) by mouth daily.     Dispense:  90 tablet    Refill:  3  . pravastatin (PRAVACHOL) 20 MG tablet    Sig: Take 1 tablet (20 mg total) by mouth daily.    Dispense:  90 tablet    Refill:  3  . pregabalin (LYRICA) 25 MG capsule    Sig: Take 1 capsule (25 mg total) by mouth 2 (two) times daily.    Dispense:  60 capsule    Refill:  6  . gabapentin (NEURONTIN) 600 MG tablet    Sig: Take 1 tablet (600 mg total) by mouth 3 (three) times daily.    Dispense:  270 tablet    Refill:  3    **Dose decreased**  . sildenafil (VIAGRA) 100 MG tablet    Sig: Take 1 tablet (  100 mg total) by mouth daily as needed for erectile dysfunction.    Dispense:  10 tablet    Refill:  3    Orders Placed This Encounter  Procedures  . CBC with Differential  . Comprehensive metabolic panel  . Lipid Panel  . TSH  . PSA  . Vitamin B12  . Vitamin D, 25-hydroxy  . Ambulatory referral to Neurology      Referral Orders     Ambulatory referral to Neurology   Raliegh Ip,  MSN, FNP-BC [facility] [address] [place], [place] [address] [phone]  Problem List Items Addressed This Visit      Cardiovascular and Mediastinum   Aortic atherosclerosis (HCC)   Relevant Medications   pravastatin (PRAVACHOL) 20 MG tablet   sildenafil (VIAGRA) 100 MG tablet     Digestive   Poor dentition     Nervous and Auditory   Neuropathy     Other   Alcohol use   Chronic left-sided low back pain without sciatica - Primary   Relevant Medications   diclofenac Sodium (VOLTAREN) 1 % GEL   pregabalin (LYRICA) 25 MG capsule   Depression   Relevant Medications   busPIRone (BUSPAR) 10 MG tablet   FLUoxetine (PROZAC) 40 MG capsule   Generalized anxiety disorder   Relevant Medications   busPIRone (BUSPAR) 10 MG tablet   FLUoxetine (PROZAC) 40 MG capsule   pregabalin (LYRICA) 25 MG capsule   Tobacco use    Other Visit Diagnoses      Screening for colon cancer       Numbness of left lower extremity       Relevant Orders   Ambulatory referral to Neurology   Medication refill       Relevant Medications   busPIRone (BUSPAR) 10 MG tablet   FLUoxetine (PROZAC) 40 MG capsule   folic acid (FOLVITE) 1 MG tablet   pravastatin (PRAVACHOL) 20 MG tablet   pregabalin (LYRICA) 25 MG capsule   gabapentin (NEURONTIN) 600 MG tablet   Health care maintenance       Relevant Orders   CBC with Differential   Comprehensive metabolic panel   Lipid Panel   TSH   PSA   Vitamin B12   Vitamin D, 25-hydroxy   Follow up          Meds ordered this encounter  Medications  . diclofenac Sodium (VOLTAREN) 1 % GEL    Sig: Apply 4 g topically 4 (four) times daily.    Dispense:  100 g    Refill:  6  . busPIRone (BUSPAR) 10 MG tablet    Sig: Take 1 tablet (10 mg total) by mouth 2 (two) times daily.    Dispense:  180 tablet    Refill:  3  . FLUoxetine (PROZAC) 40 MG capsule    Sig: Take 1 capsule (40 mg total) by mouth daily.    Dispense:  90 capsule    Refill:  3  . folic acid (FOLVITE) 1 MG tablet    Sig: Take 1 tablet (1 mg total) by mouth daily.    Dispense:  90 tablet    Refill:  3  . pravastatin (PRAVACHOL) 20 MG tablet    Sig: Take 1 tablet (20 mg total) by mouth daily.    Dispense:  90 tablet    Refill:  3  . pregabalin (LYRICA) 25 MG capsule    Sig:  Take 1 capsule (25 mg total) by mouth 2 (two) times daily.    Dispense:  60 capsule    Refill:  6  . gabapentin (NEURONTIN) 600 MG tablet    Sig: Take 1 tablet (600 mg total) by mouth 3 (three) times daily.    Dispense:  270 tablet    Refill:  3    **Dose decreased**  . sildenafil (VIAGRA) 100 MG tablet    Sig: Take 1 tablet (100 mg total) by mouth daily as needed for erectile dysfunction.    Dispense:  10 tablet    Refill:  3    Follow-up: Return in about 6 months (around 10/16/2020).    Kallie Locks, FNP

## 2020-04-19 ENCOUNTER — Encounter: Payer: Self-pay | Admitting: Neurology

## 2020-04-19 LAB — CBC WITH DIFFERENTIAL/PLATELET
Basophils Absolute: 0 10*3/uL (ref 0.0–0.2)
Basos: 1 %
EOS (ABSOLUTE): 0 10*3/uL (ref 0.0–0.4)
Eos: 0 %
Hematocrit: 47.9 % (ref 37.5–51.0)
Hemoglobin: 16.6 g/dL (ref 13.0–17.7)
Immature Grans (Abs): 0 10*3/uL (ref 0.0–0.1)
Immature Granulocytes: 0 %
Lymphocytes Absolute: 0.9 10*3/uL (ref 0.7–3.1)
Lymphs: 25 %
MCH: 31.9 pg (ref 26.6–33.0)
MCHC: 34.7 g/dL (ref 31.5–35.7)
MCV: 92 fL (ref 79–97)
Monocytes Absolute: 0.4 10*3/uL (ref 0.1–0.9)
Monocytes: 12 %
Neutrophils Absolute: 2.2 10*3/uL (ref 1.4–7.0)
Neutrophils: 62 %
Platelets: 224 10*3/uL (ref 150–450)
RBC: 5.21 x10E6/uL (ref 4.14–5.80)
RDW: 13.2 % (ref 11.6–15.4)
WBC: 3.6 10*3/uL (ref 3.4–10.8)

## 2020-04-19 LAB — TSH: TSH: 1.3 u[IU]/mL (ref 0.450–4.500)

## 2020-04-19 LAB — COMPREHENSIVE METABOLIC PANEL
ALT: 27 IU/L (ref 0–44)
AST: 49 IU/L — ABNORMAL HIGH (ref 0–40)
Albumin/Globulin Ratio: 1.7 (ref 1.2–2.2)
Albumin: 4.1 g/dL (ref 3.8–4.8)
Alkaline Phosphatase: 77 IU/L (ref 44–121)
BUN/Creatinine Ratio: 9 — ABNORMAL LOW (ref 10–24)
BUN: 8 mg/dL (ref 8–27)
Bilirubin Total: 0.6 mg/dL (ref 0.0–1.2)
CO2: 23 mmol/L (ref 20–29)
Calcium: 9.1 mg/dL (ref 8.6–10.2)
Chloride: 102 mmol/L (ref 96–106)
Creatinine, Ser: 0.94 mg/dL (ref 0.76–1.27)
GFR calc Af Amer: 100 mL/min/{1.73_m2} (ref >59)
GFR calc non Af Amer: 87 mL/min/{1.73_m2} (ref >59)
Globulin, Total: 2.4 g/dL (ref 1.5–4.5)
Glucose: 102 mg/dL — ABNORMAL HIGH (ref 65–99)
Potassium: 4.3 mmol/L (ref 3.5–5.2)
Sodium: 138 mmol/L (ref 134–144)
Total Protein: 6.5 g/dL (ref 6.0–8.5)

## 2020-04-19 LAB — LIPID PANEL
Chol/HDL Ratio: 1.8 ratio (ref 0.0–5.0)
Cholesterol, Total: 171 mg/dL (ref 100–199)
HDL: 93 mg/dL (ref >39)
LDL Chol Calc (NIH): 65 mg/dL (ref 0–99)
Triglycerides: 70 mg/dL (ref 0–149)
VLDL Cholesterol Cal: 13 mg/dL (ref 5–40)

## 2020-04-19 LAB — VITAMIN B12: Vitamin B-12: 758 pg/mL (ref 232–1245)

## 2020-04-19 LAB — PSA: Prostate Specific Ag, Serum: 0.2 ng/mL (ref 0.0–4.0)

## 2020-04-19 LAB — VITAMIN D 25 HYDROXY (VIT D DEFICIENCY, FRACTURES): Vit D, 25-Hydroxy: 17.9 ng/mL — ABNORMAL LOW (ref 30.0–100.0)

## 2020-04-25 ENCOUNTER — Other Ambulatory Visit: Payer: Self-pay | Admitting: Family Medicine

## 2020-04-25 DIAGNOSIS — E559 Vitamin D deficiency, unspecified: Secondary | ICD-10-CM

## 2020-04-25 MED ORDER — VITAMIN D (ERGOCALCIFEROL) 1.25 MG (50000 UNIT) PO CAPS: 50000.0000 [IU] | ORAL_CAPSULE | ORAL | 3 refills | Status: DC

## 2020-04-26 MED FILL — VIT D2 1.25 MG (50,000 UNIT: 1.25 MG | 35 days supply | Qty: 5 | Fill #0

## 2020-05-20 MED FILL — GABAPENTIN 800 MG TABLET: 800 | 30 days supply | Qty: 90 | Fill #0

## 2020-06-03 MED FILL — GABAPENTIN 800 MG TABLET: 800 | 30 days supply | Qty: 90 | Fill #0

## 2020-07-11 ENCOUNTER — Ambulatory Visit (INDEPENDENT_AMBULATORY_CARE_PROVIDER_SITE_OTHER): Payer: Medicare Other | Admitting: Neurology

## 2020-07-11 ENCOUNTER — Other Ambulatory Visit: Payer: Self-pay

## 2020-07-11 ENCOUNTER — Encounter: Payer: Self-pay | Admitting: Neurology

## 2020-07-11 ENCOUNTER — Other Ambulatory Visit (INDEPENDENT_AMBULATORY_CARE_PROVIDER_SITE_OTHER): Payer: Medicare Other

## 2020-07-11 VITALS — BP 129/90 | HR 106 | Ht 71.0 in | Wt 144.0 lb

## 2020-07-11 DIAGNOSIS — M4807 Spinal stenosis, lumbosacral region: Secondary | ICD-10-CM | POA: Diagnosis not present

## 2020-07-11 DIAGNOSIS — E639 Nutritional deficiency, unspecified: Secondary | ICD-10-CM

## 2020-07-11 DIAGNOSIS — R29898 Other symptoms and signs involving the musculoskeletal system: Secondary | ICD-10-CM

## 2020-07-11 DIAGNOSIS — M4307 Spondylolysis, lumbosacral region: Secondary | ICD-10-CM

## 2020-07-11 DIAGNOSIS — G621 Alcoholic polyneuropathy: Secondary | ICD-10-CM

## 2020-07-11 LAB — FOLATE: Folate: 11.8 ng/mL (ref >5.9)

## 2020-07-11 NOTE — Patient Instructions (Signed)
Please stop drinking alcohol  We will check labs today  I will see you back for nerve testing of the arms  MRI lumbar spine without contrast  Check the soles of your feet every day.  Do not drive if you cannot safely control the pedals  Start using a cane, take extra care on uneven ground

## 2020-07-11 NOTE — Progress Notes (Signed)
[facility] Neurology Division Clinic Note - Initial Visit   Date: 07/11/20  Leonard Ramos MRN: [id number] DOB: [birth year]   Dear Raliegh Ip, FNP:  Thank you for your kind referral of Leonard Ramos for consultation of hand numbness. Although his history is well known to you, please allow [place] to reiterate it for the purpose of our medical record. The patient was accompanied to the clinic by self.    History of Present Illness: Leonard Ramos is a 65 y.o. right-handed male with alcohol abuse, tobacco use, hyperlipidemia, and anxiety  presenting for evaluation of bilateral hand numbness.  Patient reports having numbness of the feet/legs for at least the past 5 years, and similar symptoms in the hands over the past 1-2 years, but symptoms have intensified since last year.  He has constant numbness, cold sensation, and to a lesser degree, tingling in the fingers and hands.  He has constant numbness in the feet.  He takes gabapentin 600mg  TID which helps with the pain, but nothing alleviates numbness. He has difficulty with fine motor movements, such as tying his shoes, putting belt on, opening jars/bottles, etc.  He has difficulty with balance and finds himself holding on to objects.  He lives alone.  He admits to drinking fifteen 12oz beers nightly x 15 years.  He is trying to quit and started going to rehab last week.   He also complains of left low back pain and left leg weakness.  This has been present for many years.  MRI lumbar spine from 2016 showed lumbar spondylosis with multilevel degenrative changes affecting L2-3, L3-4, L4-5, and L5-S1 nerve root levels.  He has not done physical therapy.     Out-side paper records, electronic medical record, and images have been reviewed where available and summarized as:  MRI lumbar spine wo contrast 02/16/2015: 1. Lumbar spondylosis, degenerative disc disease, and congenitally short pedicles cause prominent impingement at L2-3,  L3-4, and L4-5 ; moderate impingement at L5-S1; and mild impingement at T11-12, as detailed above. 2. Despite efforts by the technologist and patient, motion artifact is present on today's exam and could not be eliminated. This reduces exam sensitivity and specificity.  Lab Results  Component Value Date   HGBA1C 5.5 04/10/2019   Lab Results  Component Value Date   VITAMINB12 758 04/18/2020   Lab Results  Component Value Date   TSH 1.300 04/18/2020   No results found for: ESRSEDRATE, POCTSEDRATE  Past Medical History:  Diagnosis Date  . Alcohol use   . Anxiety   . Hyperlipidemia   . Liver disease   . Neuropathy   . Periodontitis, chronic, generalized   . Tobacco use   . Vitamin D deficiency 03/2019    Past Surgical History:  Procedure Laterality Date  . MULTIPLE EXTRACTIONS WITH ALVEOLOPLASTY Bilateral 09/04/2019   Procedure: MULTIPLE EXTRACTION W/ ALVEOLOPLASTY, Removal of left maxillary buccal exostosis;  Surgeon: 09/06/2019, DDS;  Location: [facility] OR;  Service: Oral Surgery;  Laterality: Bilateral;  . NO PAST SURGERIES       Medications:  Outpatient Encounter Medications as of 07/11/2020  Medication Sig  . aspirin 81 MG chewable tablet Chew 81 mg by mouth daily.  . busPIRone (BUSPAR) 10 MG tablet Take 1 tablet (10 mg total) by mouth 2 (two) times daily.  . Carboxymethylcellul-Glycerin (LUBRICATING EYE DROPS OP) Place 1 drop into both eyes daily as needed (dry eyes).  . chlorhexidine (PERIDEX) 0.12 % solution Use as directed 15 mLs in the  mouth or throat daily.  . diclofenac Sodium (VOLTAREN) 1 % GEL Apply 4 g topically 4 (four) times daily.  Marland Kitchen FLUoxetine (PROZAC) 40 MG capsule Take 1 capsule (40 mg total) by mouth daily.  . folic acid (FOLVITE) 1 MG tablet Take 1 tablet (1 mg total) by mouth daily.  Marland Kitchen gabapentin (NEURONTIN) 600 MG tablet Take 1 tablet (600 mg total) by mouth 3 (three) times daily.  . pravastatin (PRAVACHOL) 20 MG tablet Take 1 tablet (20 mg total) by  mouth daily.  . pregabalin (LYRICA) 25 MG capsule Take 1 capsule (25 mg total) by mouth 2 (two) times daily.  . sildenafil (VIAGRA) 100 MG tablet Take 1 tablet (100 mg total) by mouth daily as needed for erectile dysfunction.  . thiamine (VITAMIN B-1) 100 MG tablet Take 1 tablet (100 mg total) by mouth daily.  . Vitamin D, Ergocalciferol, (DRISDOL) 1.25 MG (50000 UNIT) CAPS capsule Take 1 capsule (50,000 Units total) by mouth every 7 (seven) days.  Marland Kitchen lidocaine (LIDODERM) 5 % Place 1 patch onto the skin daily. Remove & Discard patch within 12 hours or as directed by MD (Patient not taking: No sig reported)   No facility-administered encounter medications on file as of 07/11/2020.    Allergies: No Known Allergies  Family History: Family History  Family history unknown: Yes    Social History: Social History   Tobacco Use  . Smoking status: Current Every Day Smoker    Packs/day: 1.00    Types: Cigarettes  . Smokeless tobacco: Never Used  Vaping Use  . Vaping Use: Never used  Substance Use Topics  . Alcohol use: Yes    Alcohol/week: 40.0 standard drinks    Types: 40 Standard drinks or equivalent per week    Comment: 1-2 beers daily  . Drug use: Yes    Types: Marijuana    Comment: last use 09/02/2019   Social History   Social History Narrative   Right handed   Lives in a 8 story building    Drinks a pepsi occasionally     Vital Signs:  BP 129/90   Pulse (!) 106   Ht 5\' 11"  (1.803 m)   Wt 144 lb (65.3 kg)   SpO2 100%   BMI 20.08 kg/m   Neurological Exam: MENTAL STATUS including orientation to time, place, person, recent and remote memory, attention span and concentration, language, and fund of knowledge is normal.  Speech is not dysarthric.  CRANIAL NERVES: II:  No visual field defects.     III-IV-VI: Pupils equal round and reactive to light.  Normal conjugate, extra-ocular eye movements in all directions of gaze.  No nystagmus.  No ptosis.   V:  Normal facial  sensation.    VII:  Normal facial symmetry and movements.   VIII:  Normal hearing and vestibular function.   IX-X:  Normal palatal movement.   XI:  Normal shoulder shrug and head rotation.   XII:  Normal tongue strength and range of motion, no deviation or fasciculation.  MOTOR:  Severe intrinsic hand muscle atrophy and generalized loss of muscle bulk throughout. No pronator drift.   Upper Extremity:  Right  Left  Deltoid  5/5   5/5   Biceps  5/5   5/5   Triceps  5/5   5/5   Infraspinatus 5/5  5/5  Medial pectoralis 5/5  5/5  Wrist extensors  4/5   4/5   Wrist flexors  4/5   4/5   Finger extensors  3+/5   3+/5   Finger flexors  3+/5   3+/5   Dorsal interossei  3+/5   3+/5   Abductor pollicis  3+/5   3+/5   Tone (Ashworth scale)  0  0   Lower Extremity:  Right  Left  Hip flexors  5-/5   4+/5   Hip extensors  5/5   5/5   Adductor 5/5  4/5  Abductor 5/5  4/5  Knee flexors  5/5   4/5   Knee extensors  5/5   4/5   Dorsiflexors  5/5   4/5   Plantarflexors  5/5   4/5   *Toe extensors  n/a  n/a  *Toe flexors  n/a  n/a  Tone (Ashworth scale)  0  0   MSRs:  Right        Left                  brachioradialis 2+  2+  biceps 2+  2+  triceps 2+  2+  patellar 0  0  ankle jerk 0  0  Hoffman no  no  *plantar response n/a  n/a  *Patient did not allow testing of the feet with shoes/socks removed  SENSORY:  Absent vibration at the ankles, temperature reduced below the knees.  Pin prick is reduced over the palms, intact in the lower legs. Rhomberg sign is positive.   COORDINATION/GAIT: Normal finger-to- nose-finger and heel-to-shin.  Intact rapid alternating movements bilaterally.  Able to rise from a chair without using arms.  Gait is wide-based, stooped, unsteady at times, unassisted.   IMPRESSION: 1.  Alcohol -induced neuropathy - severe.  He has sensorimotor deficits involving a stocking-glove distribution manifesting with numbness, weakness of the hands and lower legs/feet, and  sensory ataxia.  Unfortunately, there is no curative treatment for neuropathy, management is supportive.  Key is alcohol cessation to minimize progression.   - Check vitamin folate, vitamin B1, copper, SPEP with IFE  - Nerve testing of the arms  - Drive safety discussed, no driving if unable to safely control pedals  - Start using a cane  - PT for balance declined  - Stressed the importance of trying to quit alcohol  - Patient educated on daily foot inspection, fall prevention, and safety precautions around the home.  2.  Lumbar spondylosis with multilevel nerve root impingement.  Exam with diffuse asymmetric left leg weakness.    - Update MRI lumbar spine wo contrast  - Discussed that the next step would be to see spine specialist   Total time spent reviewing records, interview, history/exam, documentation, counseling, and coordination of care on day of encounter:  60 min     Thank you for allowing me to participate in patient's care.  If I can answer any additional questions, I would be pleased to do so.    Sincerely,    Lamont Tant K. Allena Katz, DO

## 2020-07-14 LAB — PROTEIN ELECTROPHORESIS, SERUM
Albumin ELP: 4.5 g/dL (ref 3.8–4.8)
Alpha 1: 0.3 g/dL (ref 0.2–0.3)
Alpha 2: 0.6 g/dL (ref 0.5–0.9)
Beta 2: 0.4 g/dL (ref 0.2–0.5)
Beta Globulin: 0.4 g/dL (ref 0.4–0.6)
Gamma Globulin: 1.1 g/dL (ref 0.8–1.7)
Total Protein: 7.2 g/dL (ref 6.1–8.1)

## 2020-07-14 LAB — IMMUNOFIXATION ELECTROPHORESIS
IgG (Immunoglobin G), Serum: 1120 mg/dL (ref 600–1540)
IgM, Serum: 72 mg/dL (ref 50–300)
Immunofix Electr Int: NOT DETECTED
Immunoglobulin A: 192 mg/dL (ref 70–320)

## 2020-07-14 LAB — COPPER, SERUM: Copper: 101 ug/dL (ref 70–175)

## 2020-07-14 LAB — VITAMIN B1: Vitamin B1 (Thiamine): <6 nmol/L — ABNORMAL LOW (ref 8–30)

## 2020-07-15 ENCOUNTER — Telehealth: Payer: Self-pay

## 2020-07-15 NOTE — Telephone Encounter (Signed)
-----   Message from Glendale Chard, DO sent at 07/15/2020  9:01 AM EST ----- Lease inform patient that his vitamin B1 (thiamine) is very low and he needs to start vitamin B1 100mg  daily.  Thanks.

## 2020-07-15 NOTE — Telephone Encounter (Signed)
Called patient and informed him of results. Patient verbalized understanding and had no questions.

## 2020-07-20 ENCOUNTER — Other Ambulatory Visit: Payer: Self-pay | Admitting: Family Medicine

## 2020-07-20 DIAGNOSIS — G629 Polyneuropathy, unspecified: Secondary | ICD-10-CM

## 2020-07-20 NOTE — Telephone Encounter (Signed)
Please see med refill request °

## 2020-07-21 ENCOUNTER — Other Ambulatory Visit: Payer: Self-pay | Admitting: Family Medicine

## 2020-07-21 DIAGNOSIS — Z760 Encounter for issue of repeat prescription: Secondary | ICD-10-CM

## 2020-07-21 MED ORDER — GABAPENTIN 600 MG PO TABS: 600.0000 mg | ORAL_TABLET | Freq: Three times a day (TID) | ORAL | 3 refills | Status: DC

## 2020-07-22 MED FILL — GABAPENTIN 600 MG TABLET: 600 | 90 days supply | Qty: 270 | Fill #0

## 2020-08-19 ENCOUNTER — Emergency Department (HOSPITAL_COMMUNITY): Payer: Medicare Other

## 2020-08-19 ENCOUNTER — Other Ambulatory Visit: Payer: Self-pay

## 2020-08-19 ENCOUNTER — Encounter (HOSPITAL_COMMUNITY): Payer: Self-pay | Admitting: Emergency Medicine

## 2020-08-19 ENCOUNTER — Emergency Department (HOSPITAL_COMMUNITY)
Admission: EM | Admit: 2020-08-19 | Discharge: 2020-08-19 | Disposition: A | Discharge status: Home / Self Care | Payer: Medicare Other | Attending: Emergency Medicine | Admitting: Emergency Medicine

## 2020-08-19 ENCOUNTER — Other Ambulatory Visit (HOSPITAL_COMMUNITY): Payer: Self-pay | Admitting: Physician Assistant

## 2020-08-19 DIAGNOSIS — R0902 Hypoxemia: Secondary | ICD-10-CM | POA: Diagnosis not present

## 2020-08-19 DIAGNOSIS — Z743 Need for continuous supervision: Secondary | ICD-10-CM | POA: Diagnosis not present

## 2020-08-19 DIAGNOSIS — R9431 Abnormal electrocardiogram [ECG] [EKG]: Secondary | ICD-10-CM | POA: Insufficient documentation

## 2020-08-19 DIAGNOSIS — F17210 Nicotine dependence, cigarettes, uncomplicated: Secondary | ICD-10-CM | POA: Diagnosis not present

## 2020-08-19 DIAGNOSIS — Z79899 Other long term (current) drug therapy: Secondary | ICD-10-CM | POA: Insufficient documentation

## 2020-08-19 DIAGNOSIS — R0789 Other chest pain: Secondary | ICD-10-CM | POA: Diagnosis not present

## 2020-08-19 DIAGNOSIS — Z7982 Long term (current) use of aspirin: Secondary | ICD-10-CM | POA: Diagnosis not present

## 2020-08-19 DIAGNOSIS — R6889 Other general symptoms and signs: Secondary | ICD-10-CM | POA: Diagnosis not present

## 2020-08-19 DIAGNOSIS — R079 Chest pain, unspecified: Secondary | ICD-10-CM | POA: Diagnosis not present

## 2020-08-19 LAB — ETHANOL: Alcohol, Ethyl (B): 194 mg/dL — ABNORMAL HIGH (ref <10)

## 2020-08-19 LAB — COMPREHENSIVE METABOLIC PANEL
ALT: 24 U/L (ref 0–44)
AST: 37 U/L (ref 15–41)
Albumin: 4.2 g/dL (ref 3.5–5.0)
Alkaline Phosphatase: 57 U/L (ref 38–126)
Anion gap: 9 (ref 5–15)
BUN: 8 mg/dL (ref 8–23)
CO2: 24 mmol/L (ref 22–32)
Calcium: 8.6 mg/dL — ABNORMAL LOW (ref 8.9–10.3)
Chloride: 107 mmol/L (ref 98–111)
Creatinine, Ser: 0.84 mg/dL (ref 0.61–1.24)
GFR, Estimated: >60 mL/min (ref >60)
Glucose, Bld: 76 mg/dL (ref 70–99)
Potassium: 4.3 mmol/L (ref 3.5–5.1)
Sodium: 140 mmol/L (ref 135–145)
Total Bilirubin: 0.9 mg/dL (ref 0.3–1.2)
Total Protein: 7.2 g/dL (ref 6.5–8.1)

## 2020-08-19 LAB — CBC WITH DIFFERENTIAL/PLATELET
Abs Immature Granulocytes: 0.03 10*3/uL (ref 0.00–0.07)
Basophils Absolute: 0 10*3/uL (ref 0.0–0.1)
Basophils Relative: 1 %
Eosinophils Absolute: 0.1 10*3/uL (ref 0.0–0.5)
Eosinophils Relative: 2 %
HCT: 46.4 % (ref 39.0–52.0)
Hemoglobin: 16.2 g/dL (ref 13.0–17.0)
Immature Granulocytes: 1 %
Lymphocytes Relative: 30 %
Lymphs Abs: 1.4 10*3/uL (ref 0.7–4.0)
MCH: 32.9 pg (ref 26.0–34.0)
MCHC: 34.9 g/dL (ref 30.0–36.0)
MCV: 94.1 fL (ref 80.0–100.0)
Monocytes Absolute: 0.5 10*3/uL (ref 0.1–1.0)
Monocytes Relative: 11 %
Neutro Abs: 2.5 10*3/uL (ref 1.7–7.7)
Neutrophils Relative %: 55 %
Platelets: 222 10*3/uL (ref 150–400)
RBC: 4.93 MIL/uL (ref 4.22–5.81)
RDW: 15.5 % (ref 11.5–15.5)
WBC: 4.5 10*3/uL (ref 4.0–10.5)
nRBC: 0 % (ref 0.0–0.2)

## 2020-08-19 LAB — RAPID URINE DRUG SCREEN, HOSP PERFORMED
Amphetamines: NOT DETECTED
Barbiturates: NOT DETECTED
Benzodiazepines: NOT DETECTED
Cocaine: POSITIVE — AB
Opiates: NOT DETECTED
Tetrahydrocannabinol: POSITIVE — AB

## 2020-08-19 LAB — TROPONIN I (HIGH SENSITIVITY)
Troponin I (High Sensitivity): 9 ng/L (ref <18)
Troponin I (High Sensitivity): 9 ng/L (ref <18)

## 2020-08-19 LAB — LIPASE, BLOOD: Lipase: 30 U/L (ref 11–51)

## 2020-08-19 MED ORDER — SODIUM CHLORIDE 0.9 % IV BOLUS
1000.0000 mL | Freq: Once | INTRAVENOUS | Status: AC
  Administered 2020-08-19: 1000 mL via INTRAVENOUS

## 2020-08-19 MED ORDER — SUCRALFATE 1 GM/10ML PO SUSP: 1.0000 g | Freq: Three times a day (TID) | ORAL | 0 refills | Status: DC

## 2020-08-19 MED ORDER — ALUM & MAG HYDROXIDE-SIMETH 200-200-20 MG/5ML PO SUSP
30.0000 mL | Freq: Once | ORAL | Status: AC
  Administered 2020-08-19: 30 mL via ORAL
  Filled 2020-08-19: qty 30

## 2020-08-19 MED ORDER — LIDOCAINE VISCOUS HCL 2 % MT SOLN
15.0000 mL | Freq: Once | OROMUCOSAL | Status: AC
  Administered 2020-08-19: 15 mL via ORAL
  Filled 2020-08-19: qty 15

## 2020-08-19 MED ORDER — FAMOTIDINE 20 MG PO TABS
20.0000 mg | ORAL_TABLET | Freq: Once | ORAL | Status: AC
  Administered 2020-08-19: 20 mg via ORAL
  Filled 2020-08-19: qty 1

## 2020-08-19 MED ORDER — SUCRALFATE 1 GM/10ML PO SUSP
1.0000 g | Freq: Once | ORAL | Status: AC
  Administered 2020-08-19: 1 g via ORAL
  Filled 2020-08-19: qty 10

## 2020-08-19 MED ORDER — PANTOPRAZOLE SODIUM 20 MG PO TBEC: 20.0000 mg | DELAYED_RELEASE_TABLET | Freq: Every day | ORAL | 0 refills | Status: DC

## 2020-08-19 NOTE — ED Notes (Signed)
Pt given sandwich and sprite per his request. Pt ambulatory to restroom

## 2020-08-19 NOTE — ED Triage Notes (Signed)
BIBA Per EMS: Pt coming from neighbors house with possible OD. Upon EMS arrival, Pt unresponsive with pinpoint pupils. Pt given 2mg  narcan and responded well. Pt now still lethargic but alert. Pt complains of chest pain and palpitations. Pt denies drug use but states he drank 2 beer this morning. Pt normally drinks a 12 pack/day. Vitals:  143/99 81 HR NSR  12 RR 102 CBG  97% RA

## 2020-08-19 NOTE — Discharge Instructions (Signed)
Take protonix and carafate as directed.  Please follow up with your primary care provider within 5-7 days for re-evaluation of your symptoms. If you do not have a primary care provider, information for a healthcare clinic has been provided for you to make arrangements for follow up care.   You were also given information to follow up with a heart doctor in regards to your chest pain today. Please call the office for follow up.  Please return to the emergency department for any new or worsening symptoms.

## 2020-08-19 NOTE — ED Provider Notes (Addendum)
[facility]-EMERGENCY DEPT Provider Note   CSN: [id number] Arrival date & time: 08/19/20  1147     History Chief Complaint  Patient presents with  . Chest Pain  . Drug Overdose    Leonard Ramos is a 65 y.o. male.  HPI   65 year old male with a history of alcohol use, anxiety, hyperlipidemia, liver disease, neuropathy, tobacco use, who presents the emergency department today for evaluation of chest pain.  States he has had chest pain for the last month.  The pain is intermittent and located to the middle of the chest.  He states it feels like a gas pain.  Today the pain started after he drank a beer.  The pain is nonexertional.  He denies any associated nausea or vomiting.  Denies diaphoresis, abdominal pain, hemoptysis.  He does have a chronic cough reports some mild shortness of breath.  He admits to tobacco and marijuana use.  Reports alcohol use.  Denies any drug use.  Denies any early family history of heart disease.  Past Medical History:  Diagnosis Date  . Alcohol use   . Anxiety   . Hyperlipidemia   . Liver disease   . Neuropathy   . Periodontitis, chronic, generalized   . Tobacco use   . Vitamin D deficiency 03/2019    Patient Active Problem List   Diagnosis Date Noted  . Tobacco use 07/12/2019  . History of hyperglycemia 07/12/2019  . Erectile dysfunction 07/12/2019  . Chronic left-sided low back pain without sciatica 04/10/2019  . Alcohol use 04/10/2019  . Poor dentition 04/10/2019  . Chronic dental pain 04/10/2019  . Urinary frequency 04/10/2019  . Aortic atherosclerosis (HCC) 03/19/2017  . Generalized anxiety disorder 10/07/2015  . Depression 10/07/2015  . Neuropathy 09/06/2015  . Loss of weight 09/06/2015  . Tobacco dependence 09/06/2015  . Bowel incontinence 03/09/2015  . ETOH abuse 03/09/2015  . Left sided numbness 03/08/2015  . Left-sided weakness 03/08/2015  . Absence of bladder continence 03/08/2015    Past Surgical  History:  Procedure Laterality Date  . MULTIPLE EXTRACTIONS WITH ALVEOLOPLASTY Bilateral 09/04/2019   Procedure: MULTIPLE EXTRACTION W/ ALVEOLOPLASTY, Removal of left maxillary buccal exostosis;  Surgeon: Ocie Doyne, DDS;  Location: [facility] OR;  Service: Oral Surgery;  Laterality: Bilateral;  . NO PAST SURGERIES         Family History  Family history unknown: Yes    Social History   Tobacco Use  . Smoking status: Current Every Day Smoker    Packs/day: 1.00    Types: Cigarettes  . Smokeless tobacco: Never Used  Vaping Use  . Vaping Use: Never used  Substance Use Topics  . Alcohol use: Yes    Alcohol/week: 40.0 standard drinks    Types: 40 Standard drinks or equivalent per week    Comment: 1-2 beers daily  . Drug use: Yes    Types: Marijuana    Comment: last use 09/02/2019    Home Medications Prior to Admission medications   Medication Sig Start Date End Date Taking? Authorizing Provider  pantoprazole (PROTONIX) 20 MG tablet Take 1 tablet (20 mg total) by mouth daily for 14 days. 08/19/20 09/02/20 Yes Amariyon Maynes S, PA-C  sucralfate (CARAFATE) 1 GM/10ML suspension Take 10 mLs (1 g total) by mouth 4 (four) times daily -  with meals and at bedtime. 08/19/20  Yes Kerston Landeck S, PA-C  aspirin 81 MG chewable tablet Chew 81 mg by mouth daily.    [provider]  busPIRone (BUSPAR) 10 MG tablet Take 1 tablet (10 mg total) by mouth 2 (two) times daily. 04/18/20   Kallie LocksStroud, Natalie M, FNP  Carboxymethylcellul-Glycerin (LUBRICATING EYE DROPS OP) Place 1 drop into both eyes daily as needed (dry eyes).    [provider]  chlorhexidine (PERIDEX) 0.12 % solution Use as directed 15 mLs in the mouth or throat daily. 04/29/19   Kallie LocksStroud, Natalie M, FNP  diclofenac Sodium (VOLTAREN) 1 % GEL Apply 4 g topically 4 (four) times daily. 04/18/20   Kallie LocksStroud, Natalie M, FNP  FLUoxetine (PROZAC) 40 MG capsule Take 1 capsule (40 mg total) by mouth daily. 04/18/20   Kallie LocksStroud, Natalie M, FNP   folic acid (FOLVITE) 1 MG tablet Take 1 tablet (1 mg total) by mouth daily. 04/18/20   Kallie LocksStroud, Natalie M, FNP  gabapentin (NEURONTIN) 600 MG tablet Take 1 tablet (600 mg total) by mouth 3 (three) times daily. 07/21/20   Kallie LocksStroud, Natalie M, FNP  lidocaine (LIDODERM) 5 % Place 1 patch onto the skin daily. Remove & Discard patch within 12 hours or as directed by MD Patient not taking: No sig reported 01/08/20   Caccavale, Sophia, PA-C  pravastatin (PRAVACHOL) 20 MG tablet Take 1 tablet (20 mg total) by mouth daily. 04/18/20   Kallie LocksStroud, Natalie M, FNP  pregabalin (LYRICA) 25 MG capsule Take 1 capsule (25 mg total) by mouth 2 (two) times daily. 04/18/20   Kallie LocksStroud, Natalie M, FNP  sildenafil (VIAGRA) 100 MG tablet Take 1 tablet (100 mg total) by mouth daily as needed for erectile dysfunction. 04/18/20   Kallie LocksStroud, Natalie M, FNP  thiamine (VITAMIN B-1) 100 MG tablet Take 1 tablet (100 mg total) by mouth daily. 04/29/19   Kallie LocksStroud, Natalie M, FNP  Vitamin D, Ergocalciferol, (DRISDOL) 1.25 MG (50000 UNIT) CAPS capsule Take 1 capsule (50,000 Units total) by mouth every 7 (seven) days. 04/25/20   Kallie LocksStroud, Natalie M, FNP    Allergies    Patient has no known allergies.  Review of Systems   Review of Systems  Constitutional: Negative for chills and fever.  HENT: Negative for ear pain and sore throat.   Eyes: Negative for visual disturbance.  Respiratory: Positive for cough and shortness of breath.   Cardiovascular: Positive for chest pain. Negative for leg swelling.  Gastrointestinal: Negative for abdominal pain, constipation, diarrhea, nausea and vomiting.  Genitourinary: Negative for dysuria and hematuria.  Musculoskeletal: Negative for back pain.  Skin: Negative for rash.  Neurological: Negative for seizures and syncope.  All other systems reviewed and are negative.   Physical Exam Updated Vital Signs BP (!) 142/90   Pulse 96   Temp (!) 97.3 F (36.3 C) (Oral)   Resp 19   SpO2 94%   Physical  Exam Vitals and nursing note reviewed.  Constitutional:      Appearance: He is well-developed.  HENT:     Head: Normocephalic and atraumatic.  Eyes:     Conjunctiva/sclera: Conjunctivae normal.  Cardiovascular:     Rate and Rhythm: Normal rate and regular rhythm.     Heart sounds: Normal heart sounds. No murmur heard.   Pulmonary:     Effort: Pulmonary effort is normal. No respiratory distress.     Breath sounds: Normal breath sounds. No decreased breath sounds, rhonchi or rales.     Comments: Rare end expiratory wheeze Abdominal:     Palpations: Abdomen is soft.     Tenderness: There is no guarding or rebound.     Comments: Mild epigastric and  luq abd ttp  Musculoskeletal:     Cervical back: Neck supple.     Right lower leg: No tenderness. No edema.     Left lower leg: No tenderness. No edema.  Skin:    General: Skin is warm and dry.  Neurological:     Mental Status: He is alert.     Comments: Clear speech, moving all extremities, following commands     ED Results / Procedures / Treatments   Labs (all labs ordered are listed, but only abnormal results are displayed) Labs Reviewed  COMPREHENSIVE METABOLIC PANEL - Abnormal; Notable for the following components:      Result Value   Calcium 8.6 (*)    All other components within normal limits  RAPID URINE DRUG SCREEN, HOSP PERFORMED - Abnormal; Notable for the following components:   Cocaine POSITIVE (*)    Tetrahydrocannabinol POSITIVE (*)    All other components within normal limits  ETHANOL - Abnormal; Notable for the following components:   Alcohol, Ethyl (B) 194 (*)    All other components within normal limits  CBC WITH DIFFERENTIAL/PLATELET  LIPASE, BLOOD  TROPONIN I (HIGH SENSITIVITY)  TROPONIN I (HIGH SENSITIVITY)    EKG EKG Interpretation  Date/Time:  Friday August 19 2020 12:33:32 EDT Ventricular Rate:  88 PR Interval:  186 QRS Duration: 95 QT Interval:  428 QTC Calculation: 518 R Axis:   45 Text  Interpretation: Sinus rhythm Probable left atrial enlargement RSR' in V1 or V2, probably normal variant Prolonged QT interval Confirmed by Benjiman Core [phone]) on 08/19/2020 1:56:27 PM  EKG Interpretation  Date/Time:  Friday August 19 2020 13:59:00 EDT Ventricular Rate:  79 PR Interval:  168 QRS Duration: 101 QT Interval:  460 QTC Calculation: 528 R Axis:   19 Text Interpretation: Sinus rhythm Prolonged QT interval Confirmed by Benjiman Core [phone]) on 08/19/2020 3:20:48 PM   Radiology DG Chest Portable 1 View  Result Date: 08/19/2020 CLINICAL DATA:  Chest pain. EXAM: PORTABLE CHEST 1 VIEW COMPARISON:  None. FINDINGS: The heart size and mediastinal contours are within normal limits. Both lungs are clear. No pneumothorax or pleural effusion is noted. The visualized skeletal structures are unremarkable. IMPRESSION: No active disease. Electronically Signed   By: Lupita Raider M.D.   On: 08/19/2020 13:16    Procedures Procedures   Medications Ordered in ED Medications  alum & mag hydroxide-simeth (MAALOX/MYLANTA) 200-200-20 MG/5ML suspension 30 mL (30 mLs Oral Given 08/19/20 1253)    And  lidocaine (XYLOCAINE) 2 % viscous mouth solution 15 mL (15 mLs Oral Given 08/19/20 1253)  sucralfate (CARAFATE) 1 GM/10ML suspension 1 g (1 g Oral Given 08/19/20 1253)  famotidine (PEPCID) tablet 20 mg (20 mg Oral Given 08/19/20 1253)  sodium chloride 0.9 % bolus 1,000 mL (0 mLs Intravenous Stopped 08/19/20 1503)    ED Course  I have reviewed the triage vital signs and the nursing notes.  Pertinent labs & imaging results that were available during my care of the patient were reviewed by me and considered in my medical decision making (see chart for details).    MDM Rules/Calculators/A&P                          65 year old male presenting for evaluation of chest pain that started after drinking a beer today.  Reviewed/interpreted labs CBC - wnl CMP - unremarkable Lipase - unremarkable Trop -  neg x2 UDS - + for THC and marijuana  Initial  EKG - Sinus rhythm Probable left atrial enlargement RSR' in V1 or V2, probably normal variant Prolonged QT interval Repeat EKG - Sinus rhythm Prolonged QT interval  CXR - : No active disease.  1:48 PM reassessed pt. He is sleeping comfortably. He feels better after medications and no longer has any chest pain. Denies abd pain.   3:32 PM pt requesting to leave. He states he feels improved. Discussed w/u thus far including positive uds. Advised on cessation of illicit substances and risks of cocaine use. Pt voices understanding. Declines peer support consult, states he is already getting help. In regards to chest pain, w/u thus far is negative and I have low suspicion for acs. His pain improved after antacids, gi cocktail and carafate, he has tolerated po and is feeling better. Low suspicion for acs, pe, dissection or other emergent etiology of sxs. Suspect gastritis/gerd from etoh use. will give meds for this. Advised on plan for f/u and return precautions. Info for pcp given and cards referral given. Pt voices understanding and is in agreement with plan. All questions answered, pt stable for discharge.     Final Clinical Impression(s) / ED Diagnoses Final diagnoses:  Atypical chest pain  Prolonged QT interval    Rx / DC Orders ED Discharge Orders         Ordered    pantoprazole (PROTONIX) 20 MG tablet  Daily        08/19/20 1537    sucralfate (CARAFATE) 1 GM/10ML suspension  3 times daily with meals & bedtime        08/19/20 1537           Raeleigh Guinn S, PA-C 08/19/20 1537    Cobie Leidner S, PA-C 08/19/20 1550    Benjiman Core, MD 08/22/20 [phone]

## 2020-08-20 ENCOUNTER — Other Ambulatory Visit: Payer: Self-pay

## 2020-08-21 ENCOUNTER — Other Ambulatory Visit: Payer: Self-pay

## 2020-08-21 MED FILL — Pantoprazole Sodium EC Tab 20 MG (Base Equiv): ORAL | 14 days supply | Qty: 14 | Fill #0 | Status: AC

## 2020-08-21 MED FILL — Sucralfate Susp 1 GM/10ML: ORAL | 10 days supply | Qty: 420 | Fill #0 | Status: AC

## 2020-08-24 ENCOUNTER — Ambulatory Visit: Payer: Medicare Other | Admitting: Family Medicine

## 2020-08-24 ENCOUNTER — Other Ambulatory Visit: Payer: Self-pay

## 2020-08-24 ENCOUNTER — Ambulatory Visit (INDEPENDENT_AMBULATORY_CARE_PROVIDER_SITE_OTHER): Payer: Medicare Other | Admitting: Neurology

## 2020-08-24 DIAGNOSIS — G621 Alcoholic polyneuropathy: Secondary | ICD-10-CM

## 2020-08-24 DIAGNOSIS — G5623 Lesion of ulnar nerve, bilateral upper limbs: Secondary | ICD-10-CM

## 2020-08-24 DIAGNOSIS — M4307 Spondylolysis, lumbosacral region: Secondary | ICD-10-CM

## 2020-08-24 DIAGNOSIS — E639 Nutritional deficiency, unspecified: Secondary | ICD-10-CM

## 2020-08-24 DIAGNOSIS — R29898 Other symptoms and signs involving the musculoskeletal system: Secondary | ICD-10-CM

## 2020-08-24 DIAGNOSIS — M4807 Spinal stenosis, lumbosacral region: Secondary | ICD-10-CM

## 2020-08-24 NOTE — Procedures (Signed)
[facility] Neurology  [address] [place], [address]  [place], [place] [address] Tel: [phone] Fax:  [phone] Test Date:  08/24/2020  Patient: Leonard Ramos DOB: 20-Jul-1955 Physician: Nita Sickle, DO  Sex: Male Height: 5\' 11"  Ref Phys: , DO  ID#: Nita Sickle   Technician:    Patient Complaints: This is a 65 year old man referred for evaluation of bilateral hand paresthesias and weakness.  NCV & EMG Findings: Extensive electrodiagnostic testing of the right upper extremity and additional studies of the left shows:  1. Bilateral median and right ulnar sensory responses are within normal limits.  Left ulnar sensory response shows mildly prolonged latency (3.4 ms). 2. Bilateral median motor responses are within normal limits.  Bilateral ulnar motor responses show slowed conduction velocity across the elbow (A Elbow-B Elbow, R34, L40 m/s).   3. Chronic motor axonal loss changes are seen affecting bilateral ulnar innervated muscles, without accompanying active denervation.    Impression: 1. Bilateral ulnar neuropathy with slowing across the elbow, purely demyelinating, moderate. 2. There is no evidence of carpal tunnel syndrome or sensorimotor polyneuropathy affecting the upper extremities.   ___________________________ 65, DO    Nerve Conduction Studies Anti Sensory Summary Table   Stim Site NR Peak (ms) Norm Peak (ms) P-T Amp (V) Norm P-T Amp  Left Median Anti Sensory (2nd Digit)  32C  Wrist    3.6 <3.8 23.5 >10  Right Median Anti Sensory (2nd Digit)  32C  Wrist    3.4 <3.8 23.0 >10  Left Ulnar Anti Sensory (5th Digit)  32C  Wrist    3.4 <3.2 17.4 >5  Right Ulnar Anti Sensory (5th Digit)  32C  Wrist    3.2 <3.2 16.2 >5   Motor Summary Table   Stim Site NR Onset (ms) Norm Onset (ms) O-P Amp (mV) Norm O-P Amp Site1 Site2 Delta-0 (ms) Dist (cm) Vel (m/s) Norm Vel (m/s)  Left Median Motor (Abd Poll Brev)  32C  Wrist    3.8 <4.0 8.7 >5 Elbow  Wrist 5.7 29.0 51 >50  Elbow    9.5  8.2         Right Median Motor (Abd Poll Brev)  32C  Wrist    3.4 <4.0 8.6 >5 Elbow Wrist 6.1 31.0 51 >50  Elbow    9.5  8.3         Left Ulnar Motor (Abd Dig Minimi)  32C  Wrist    2.8 <3.1 10.1 >7 B Elbow Wrist 4.5 23.0 51 >50  B Elbow    7.3  9.8  A Elbow B Elbow 2.5 10.0 40 >50  A Elbow    9.8  9.0         Right Ulnar Motor (Abd Dig Minimi)  32C  Wrist    2.9 <3.1 11.6 >7 B Elbow Wrist 4.4 23.0 52 >50  B Elbow    7.3  11.2  A Elbow B Elbow 2.9 10.0 34 >50  A Elbow    10.2  9.7          EMG   Side Muscle Ins Act Fibs Psw Fasc Number Recrt Dur Dur. Amp Amp. Poly Poly. Comment  Right 1stDorInt Nml Nml Nml Nml 1- Rapid Some 1+ Some 1+ Some 1+ N/A  Right Ext Indicis Nml Nml Nml Nml Nml Nml Nml Nml Nml Nml Nml Nml N/A  Right PronatorTeres Nml Nml Nml Nml Nml Nml Nml Nml Nml Nml Nml Nml N/A  Right Biceps Nml Nml  Nml Nml Nml Nml Nml Nml Nml Nml Nml Nml N/A  Right Triceps Nml Nml Nml Nml Nml Nml Nml Nml Nml Nml Nml Nml N/A  Right Deltoid Nml Nml Nml Nml Nml Nml Nml Nml Nml Nml Nml Nml N/A  Right ABD Dig Min Nml Nml Nml Nml 2- Rapid Some 1+ Some 1+ Some 1+ N/A  Right FlexCarpiUln Nml Nml Nml Nml 1- Rapid Some 1+ Some 1+ Some 1+ N/A  Left 1stDorInt Nml Nml Nml Nml 1- Rapid Some 1+ Some 1+ Some 1+ N/A  Left Ext Indicis Nml Nml Nml Nml Nml Nml Nml Nml Nml Nml Nml Nml N/A  Left PronatorTeres Nml Nml Nml Nml Nml Nml Nml Nml Nml Nml Nml Nml N/A  Left Biceps Nml Nml Nml Nml Nml Nml Nml Nml Nml Nml Nml Nml N/A  Left Triceps Nml Nml Nml Nml Nml Nml Nml Nml Nml Nml Nml Nml N/A  Left Deltoid Nml Nml Nml Nml Nml Nml Nml Nml Nml Nml Nml Nml N/A  Left ABD Dig Min Nml Nml Nml Nml 2- Rapid Some 1+ Some 1+ Some 1+ N/A  Left FlexCarpiUln Nml Nml Nml Nml 1- Rapid Some 1+ Some 1+ Some 1+ N/A      Waveforms:

## 2020-08-25 ENCOUNTER — Telehealth: Payer: Self-pay

## 2020-08-25 ENCOUNTER — Other Ambulatory Visit: Payer: Self-pay

## 2020-08-25 ENCOUNTER — Other Ambulatory Visit: Payer: Self-pay | Admitting: Neurology

## 2020-08-25 ENCOUNTER — Telehealth: Payer: Self-pay | Admitting: Neurology

## 2020-08-25 MED ORDER — DIAZEPAM 5 MG PO TABS
ORAL_TABLET | ORAL | 0 refills | Status: DC
  Filled 2020-08-25: qty 2, fill #0

## 2020-08-25 NOTE — Telephone Encounter (Signed)
Called patient and informed him that [facility] imaging has been trying to reach him to schedule his MRI. Patient was provided with [facility] imaging phone number and advised to call and schedule his MRI. Patient stated he would.

## 2020-08-25 NOTE — Telephone Encounter (Signed)
Called and spoke to patient and informed him that Valium was sent to his pharmacy for his MRI.

## 2020-08-25 NOTE — Telephone Encounter (Signed)
Patient has a MRI sch for 09-10-20 and needs to something to help him relax during the MRI. Can you call him in something to help him? [facility]   Patient asked for a call back to let him know when this has been taken care

## 2020-08-25 NOTE — Telephone Encounter (Signed)
-----   Message from Glendale Chard, DO sent at 08/24/2020 12:18 PM EDT ----- Can you follow-up scheduling his MRI lumbar spine?  I see it is ordered, but no one contacted him to schedule. Thanks.

## 2020-08-25 NOTE — Telephone Encounter (Signed)
Called [facility] and asked if patient was contacted to schedule his MRI. [facility] informed me that they have contacted patient multiple times which was unsuccessful and patient has not returned any of their calls.

## 2020-08-25 NOTE — Telephone Encounter (Signed)
Please give the number of [facility] to pt to schedule and let them know they have tried to call him.

## 2020-08-30 ENCOUNTER — Other Ambulatory Visit: Payer: Self-pay

## 2020-08-31 ENCOUNTER — Ambulatory Visit (INDEPENDENT_AMBULATORY_CARE_PROVIDER_SITE_OTHER): Payer: Medicare Other | Admitting: Family Medicine

## 2020-08-31 ENCOUNTER — Other Ambulatory Visit: Payer: Self-pay

## 2020-08-31 ENCOUNTER — Encounter: Payer: Self-pay | Admitting: Family Medicine

## 2020-08-31 VITALS — BP 140/94 | HR 88 | Ht 71.0 in | Wt 143.0 lb

## 2020-08-31 DIAGNOSIS — R0789 Other chest pain: Secondary | ICD-10-CM | POA: Diagnosis not present

## 2020-08-31 DIAGNOSIS — G8929 Other chronic pain: Secondary | ICD-10-CM | POA: Diagnosis not present

## 2020-08-31 DIAGNOSIS — T50901A Poisoning by unspecified drugs, medicaments and biological substances, accidental (unintentional), initial encounter: Secondary | ICD-10-CM | POA: Diagnosis not present

## 2020-08-31 DIAGNOSIS — M545 Low back pain, unspecified: Secondary | ICD-10-CM

## 2020-08-31 DIAGNOSIS — Z09 Encounter for follow-up examination after completed treatment for conditions other than malignant neoplasm: Secondary | ICD-10-CM

## 2020-08-31 DIAGNOSIS — F411 Generalized anxiety disorder: Secondary | ICD-10-CM | POA: Diagnosis not present

## 2020-08-31 DIAGNOSIS — T50901D Poisoning by unspecified drugs, medicaments and biological substances, accidental (unintentional), subsequent encounter: Secondary | ICD-10-CM

## 2020-08-31 MED ORDER — DIAZEPAM 5 MG PO TABS
ORAL_TABLET | ORAL | 0 refills | Status: DC
Start: 2020-08-31 — End: 2021-02-28

## 2020-08-31 MED FILL — Gabapentin Tab 600 MG: ORAL | 90 days supply | Qty: 270 | Fill #0 | Status: CN

## 2020-08-31 NOTE — Progress Notes (Signed)
[facility]    Established Patient Office Visit  Subjective:  Patient ID: Leonard Ramos, male    DOB: 10-20-1955  Age: 65 y.o. MRN: [id number]  CC:  Chief Complaint  Patient presents with  . Numbness    HPI Leonard Ramos is a 65 year old male who presents for Follow Up today.   Patient Active Problem List   Diagnosis Date Noted  . Tobacco use 07/12/2019  . History of hyperglycemia 07/12/2019  . Erectile dysfunction 07/12/2019  . Chronic left-sided low back pain without sciatica 04/10/2019  . Alcohol use 04/10/2019  . Poor dentition 04/10/2019  . Chronic dental pain 04/10/2019  . Urinary frequency 04/10/2019  . Aortic atherosclerosis (HCC) 03/19/2017  . Generalized anxiety disorder 10/07/2015  . Depression 10/07/2015  . Neuropathy 09/06/2015  . Loss of weight 09/06/2015  . Tobacco dependence 09/06/2015  . Bowel incontinence 03/09/2015  . ETOH abuse 03/09/2015  . Left sided numbness 03/08/2015  . Left-sided weakness 03/08/2015  . Absence of bladder continence 03/08/2015   Current Status: Since his last office visit, he has has a ED visit on 08/19/2020 for Atypical Chest Pain. He states that he was taking crack cocaine and it was mixed with fentanyl which he was unaware and he experienced increased chest pain. He states that 08/22/2020 was the last time he did cocaine (crack).  He has not had any headaches, visual changes, dizziness, and falls. No chest pain, heart palpitations, cough and shortness of breath reported. Today,  is doing well with no complaints. He also has c/o chronic back pain, which he is scheduled for MRI on 09/10/2020. He denies fevers, chills, fatigue, recent infections, weight loss, and night sweats.  Denies GI problems such as nausea, vomiting, diarrhea, and constipation. He has no reports of blood in stools, dysuria and hematuria. No depression or anxiety reported. He is taking all medications as  prescribed. He denies pain today.   Past Medical History:  Diagnosis Date  . Alcohol use   . Anxiety   . Hyperlipidemia   . Liver disease   . Neuropathy   . Periodontitis, chronic, generalized   . Tobacco use   . Vitamin D deficiency 03/2019    Past Surgical History:  Procedure Laterality Date  . MULTIPLE EXTRACTIONS WITH ALVEOLOPLASTY Bilateral 09/04/2019   Procedure: MULTIPLE EXTRACTION W/ ALVEOLOPLASTY, Removal of left maxillary buccal exostosis;  Surgeon: Ocie Doyne, DDS;  Location: [facility] OR;  Service: Oral Surgery;  Laterality: Bilateral;  . NO PAST SURGERIES      Family History  Family history unknown: Yes    Social History   Socioeconomic History  . Marital status: Single    Spouse name: Not on file  . Number of children: 4  . Years of education: Not on file  . Highest education level: Not on file  Occupational History  . Not on file  Tobacco Use  . Smoking status: Current Every Day Smoker    Packs/day: 1.00    Types: Cigarettes  . Smokeless tobacco: Never Used  Vaping Use  . Vaping Use: Never used  Substance and Sexual Activity  . Alcohol use: Yes    Alcohol/week: 40.0 standard drinks    Types: 40 Standard drinks or equivalent per week    Comment: 1-2 beers daily  . Drug use: Yes    Types: Marijuana    Comment: last use 09/02/2019  . Sexual activity: Yes    Partners: Female  Other Topics Concern  . Not on file  Social History Narrative   Right handed   Lives in a 8 story building    Drinks a pepsi occasionally    Social Determinants of Health   Financial Resource Strain: Not on file  Food Insecurity: Not on file  Transportation Needs: Not on file  Physical Activity: Not on file  Stress: Not on file  Social Connections: Not on file  Intimate Partner Violence: Not on file    Outpatient Medications Prior to Visit  Medication Sig Dispense Refill  . aspirin 81 MG chewable tablet Chew 81 mg by mouth daily.    . busPIRone (BUSPAR) 10 MG tablet  TAKE 1 TABLET (10 MG TOTAL) BY MOUTH 2 (TWO) TIMES DAILY. 180 tablet 3  . Carboxymethylcellul-Glycerin (LUBRICATING EYE DROPS OP) Place 1 drop into both eyes daily as needed (dry eyes).    . chlorhexidine (PERIDEX) 0.12 % solution Use as directed 15 mLs in the mouth or throat daily. 473 mL 6  . diclofenac Sodium (VOLTAREN) 1 % GEL APPLY 4 GRAMS TOPICALLY 4 (FOUR) TIMES DAILY. 100 g 6  . FLUoxetine (PROZAC) 40 MG capsule TAKE 1 CAPSULE (40 MG TOTAL) BY MOUTH DAILY. 90 capsule 3  . folic acid (FOLVITE) 1 MG tablet Take 1 tablet (1 mg total) by mouth daily. 90 tablet 3  . gabapentin (NEURONTIN) 600 MG tablet TAKE 1 TABLET (600 MG TOTAL) BY MOUTH 3 (THREE) TIMES DAILY. 270 tablet 3  . lidocaine (LIDODERM) 5 % Place 1 patch onto the skin daily. Remove & Discard patch within 12 hours or as directed by MD 30 patch 0  . pantoprazole (PROTONIX) 20 MG tablet TAKE 1 TABLET (20 MG TOTAL) BY MOUTH DAILY FOR 14 DAYS. 14 tablet 0  . pravastatin (PRAVACHOL) 20 MG tablet TAKE 1 TABLET (20 MG TOTAL) BY MOUTH DAILY. 90 tablet 3  . pregabalin (LYRICA) 25 MG capsule TAKE 1 CAPSULE (25 MG TOTAL) BY MOUTH 2 (TWO) TIMES DAILY. 60 capsule 5  . sildenafil (VIAGRA) 100 MG tablet Take 1 tablet (100 mg total) by mouth daily as needed for erectile dysfunction. 10 tablet 3  . sucralfate (CARAFATE) 1 GM/10ML suspension TAKE 10 MLS (1 G TOTAL) BY MOUTH 4 (FOUR) TIMES DAILY - WITH MEALS AND AT BEDTIME. 420 mL 0  . thiamine (VITAMIN B-1) 100 MG tablet Take 1 tablet (100 mg total) by mouth daily. 30 tablet 6  . Vitamin D, Ergocalciferol, (DRISDOL) 1.25 MG (50000 UNIT) CAPS capsule Take 1 capsule (50,000 Units total) by mouth every 7 (seven) days. 5 capsule 3  . diazepam (VALIUM) 5 MG tablet Take 1 tablet 30-min prior to MRI.  Do not drive while taking this medication. 2 tablet 0   No facility-administered medications prior to visit.    No Known Allergies  ROS Review of Systems  Constitutional: Negative.   HENT: Negative.    Eyes: Negative.   Respiratory: Positive for shortness of breath (occasional).   Cardiovascular: Negative.   Gastrointestinal: Negative.   Endocrine: Negative.   Genitourinary: Negative.   Musculoskeletal: Positive for arthralgias (generalized ) and back pain (chronic).  Skin: Negative.   Allergic/Immunologic: Negative.   Neurological: Positive for dizziness (occasional ), weakness (generalized) and headaches (occasional).  Hematological: Negative.   Psychiatric/Behavioral: Negative.       Objective:    Physical Exam Vitals and nursing note reviewed.  Constitutional:      Comments: Appears malnourished.   HENT:     Head: Normocephalic  and atraumatic.     Nose: Nose normal.     Mouth/Throat:     Mouth: Mucous membranes are moist.     Pharynx: Oropharynx is clear.  Cardiovascular:     Rate and Rhythm: Normal rate and regular rhythm.     Pulses: Normal pulses.     Heart sounds: Normal heart sounds.  Pulmonary:     Effort: Pulmonary effort is normal.     Breath sounds: Normal breath sounds.  Abdominal:     General: Bowel sounds are normal.     Palpations: Abdomen is soft.  Musculoskeletal:     Cervical back: Normal range of motion and neck supple.     Comments: Limited ROM in spine  Skin:    General: Skin is warm and dry.  Neurological:     General: No focal deficit present.     Mental Status: He is alert and oriented to person, place, and time.     Motor: Weakness (generalized ) present.     Gait: Gait abnormal.  Psychiatric:        Mood and Affect: Mood normal.        Behavior: Behavior normal.        Thought Content: Thought content normal.        Judgment: Judgment normal.     BP (!) 140/94   Pulse 88   Ht 5\' 11"  (1.803 m)   Wt 143 lb (64.9 kg)   SpO2 100%   BMI 19.94 kg/m  Wt Readings from Last 3 Encounters:  08/31/20 143 lb (64.9 kg)  07/11/20 144 lb (65.3 kg)  04/18/20 146 lb (66.2 kg)     Health Maintenance Due  Topic Date Due  . COVID-19  Vaccine (2 - Moderna 3-dose series) 10/26/2019    There are no preventive care reminders to display for this patient.  Lab Results  Component Value Date   TSH 1.300 04/18/2020   Lab Results  Component Value Date   WBC 4.5 08/19/2020   HGB 16.2 08/19/2020   HCT 46.4 08/19/2020   MCV 94.1 08/19/2020   PLT 222 08/19/2020   Lab Results  Component Value Date   NA 140 08/19/2020   K 4.3 08/19/2020   CO2 24 08/19/2020   GLUCOSE 76 08/19/2020   BUN 8 08/19/2020   CREATININE 0.84 08/19/2020   BILITOT 0.9 08/19/2020   ALKPHOS 57 08/19/2020   AST 37 08/19/2020   ALT 24 08/19/2020   PROT 7.2 08/19/2020   ALBUMIN 4.2 08/19/2020   CALCIUM 8.6 (L) 08/19/2020   ANIONGAP 9 08/19/2020   GFR 103.47 04/29/2019   Lab Results  Component Value Date   CHOL 171 04/18/2020   Lab Results  Component Value Date   HDL 93 04/18/2020   Lab Results  Component Value Date   LDLCALC 65 04/18/2020   Lab Results  Component Value Date   TRIG 70 04/18/2020   Lab Results  Component Value Date   CHOLHDL 1.8 04/18/2020   Lab Results  Component Value Date   HGBA1C 5.5 04/10/2019      Assessment & Plan:   1. Hospital discharge follow-up  2. Accidental drug overdose, subsequent encounter Stable today. Declines referral to drug treatment program at this time. He is advised to contact our office if he needs referral in the future. We will continue to monitor.   3. Atypical chest pain Resolved. Monitor.   4. Generalized anxiety disorder Moderate today. R/t increasing chronic back pain.  5. Pre-operative anxiety Take prior to MRI procedure.  - diazepam (VALIUM) 5 MG tablet; Take 1 tablet 30-min prior to MRI.  Do not drive while taking this medication.  Dispense: 2 tablet; Refill: 0  6. Chronic left-sided low back pain without sciatica Moderate today. He will continue supportive medications as needed.   7. Follow up He will follow up in 3 months.   Meds ordered this encounter   Medications  . diazepam (VALIUM) 5 MG tablet    Sig: Take 1 tablet 30-min prior to MRI.  Do not drive while taking this medication.    Dispense:  2 tablet    Refill:  0    No orders of the defined types were placed in this encounter.   Referral Orders  No referral(s) requested today    Raliegh Ip, MSN, ANE, FNP-BC [facility] [address] [place], [place] [address] [phone]- fax   Problem List Items Addressed This Visit      Other   Chronic left-sided low back pain without sciatica   Generalized anxiety disorder   Relevant Medications   diazepam (VALIUM) 5 MG tablet    Other Visit Diagnoses    Hospital discharge follow-up    -  Primary   Accidental drug overdose, subsequent encounter       Atypical chest pain       Pre-operative anxiety       Relevant Medications   diazepam (VALIUM) 5 MG tablet   Follow up          Meds ordered this encounter  Medications  . diazepam (VALIUM) 5 MG tablet    Sig: Take 1 tablet 30-min prior to MRI.  Do not drive while taking this medication.    Dispense:  2 tablet    Refill:  0    Follow-up: No follow-ups on file.    Kallie Locks, FNP

## 2020-09-01 ENCOUNTER — Other Ambulatory Visit: Payer: Self-pay

## 2020-09-01 ENCOUNTER — Encounter: Payer: Self-pay | Admitting: Family Medicine

## 2020-09-01 ENCOUNTER — Other Ambulatory Visit: Payer: Self-pay | Admitting: Physician Assistant

## 2020-09-01 MED FILL — Buspirone HCl Tab 10 MG: ORAL | 90 days supply | Qty: 180 | Fill #0 | Status: AC

## 2020-09-01 MED FILL — Pregabalin Cap 25 MG: ORAL | 30 days supply | Qty: 60 | Fill #0 | Status: AC

## 2020-09-01 MED FILL — Fluoxetine HCl Cap 40 MG: ORAL | 90 days supply | Qty: 90 | Fill #0 | Status: AC

## 2020-09-01 MED FILL — Pravastatin Sodium Tab 20 MG: ORAL | 90 days supply | Qty: 90 | Fill #0 | Status: AC

## 2020-09-05 ENCOUNTER — Other Ambulatory Visit: Payer: Self-pay

## 2020-09-06 ENCOUNTER — Other Ambulatory Visit: Payer: Self-pay

## 2020-09-07 NOTE — Progress Notes (Signed)
Follow-up Visit   Date: 09/08/20   Leonard Ramos MRN: [id number] DOB: 08/20/1955   Interim History: Leonard Ramos is a 65 y.o. right-handed male with alcohol abuse, tobacco use, hyperlipidemia, and anxiety returning to the clinic for follow-up of bilateral hand numbness and left leg weakness.  The patient was accompanied to the clinic by self.  History of present illness: Patient reports having numbness of the feet/legs for at least the past 5 years, and similar symptoms in the hands over the past 1-2 years, but symptoms have intensified since last year.  He has constant numbness, cold sensation, and to a lesser degree, tingling in the fingers and hands.  He has constant numbness in the feet.  He takes gabapentin 600mg  TID which helps with the pain, but nothing alleviates numbness. He has difficulty with fine motor movements, such as tying his shoes, putting belt on, opening jars/bottles, etc.  He has difficulty with balance and finds himself holding on to objects.  He lives alone.  He admits to drinking fifteen 12oz beers nightly x 15 years.  He is trying to quit and started going to rehab last week.   He also complains of left low back pain and left leg weakness.  This has been present for many years.  MRI lumbar spine from 2016 showed lumbar spondylosis with multilevel degenrative changes affecting L2-3, L3-4, L4-5, and L5-S1 nerve root levels.  He has not done physical therapy.   UPDATE 09/08/2020:  He is here for follow-up visit.  He reports having worsening hand weakness, dexterity, and sensation.  He cannot open jars/bottles, tie his shoe laces, or reach for things in his pocket.  NCS/EMG of the arms which shows bilateral ulnar neuropathy at the elbows.  No evidence of neuropathy in the arms.  He is scheduled to have MRI lumbar spine on 4/23 to evaluate his left leg weakness.  He continues to drink several 40oz beers daily.  Medications:  Current Outpatient Medications on File  Prior to Visit  Medication Sig Dispense Refill  . aspirin 81 MG chewable tablet Chew 81 mg by mouth daily.    . busPIRone (BUSPAR) 10 MG tablet TAKE 1 TABLET (10 MG TOTAL) BY MOUTH 2 (TWO) TIMES DAILY. 180 tablet 3  . Carboxymethylcellul-Glycerin (LUBRICATING EYE DROPS OP) Place 1 drop into both eyes daily as needed (dry eyes).    5/23 diclofenac Sodium (VOLTAREN) 1 % GEL APPLY 4 GRAMS TOPICALLY 4 (FOUR) TIMES DAILY. 100 g 6  . FLUoxetine (PROZAC) 40 MG capsule TAKE 1 CAPSULE (40 MG TOTAL) BY MOUTH DAILY. 90 capsule 3  . folic acid (FOLVITE) 1 MG tablet Take 1 tablet (1 mg total) by mouth daily. 90 tablet 3  . gabapentin (NEURONTIN) 600 MG tablet TAKE 1 TABLET (600 MG TOTAL) BY MOUTH 3 (THREE) TIMES DAILY. 270 tablet 3  . lidocaine (LIDODERM) 5 % Place 1 patch onto the skin daily. Remove & Discard patch within 12 hours or as directed by MD 30 patch 0  . pantoprazole (PROTONIX) 20 MG tablet TAKE 1 TABLET (20 MG TOTAL) BY MOUTH DAILY FOR 14 DAYS. 14 tablet 0  . pravastatin (PRAVACHOL) 20 MG tablet TAKE 1 TABLET (20 MG TOTAL) BY MOUTH DAILY. 90 tablet 3  . pregabalin (LYRICA) 25 MG capsule TAKE 1 CAPSULE (25 MG TOTAL) BY MOUTH 2 (TWO) TIMES DAILY. 60 capsule 5  . sildenafil (VIAGRA) 100 MG tablet Take 1 tablet (100 mg total) by mouth daily as needed for erectile  dysfunction. 10 tablet 3  . sucralfate (CARAFATE) 1 GM/10ML suspension TAKE 10 MLS (1 G TOTAL) BY MOUTH 4 (FOUR) TIMES DAILY - WITH MEALS AND AT BEDTIME. 420 mL 0  . thiamine (VITAMIN B-1) 100 MG tablet Take 1 tablet (100 mg total) by mouth daily. 30 tablet 6  . Vitamin D, Ergocalciferol, (DRISDOL) 1.25 MG (50000 UNIT) CAPS capsule Take 1 capsule (50,000 Units total) by mouth every 7 (seven) days. 5 capsule 3  . chlorhexidine (PERIDEX) 0.12 % solution Use as directed 15 mLs in the mouth or throat daily. (Patient not taking: Reported on 09/08/2020) 473 mL 6  . diazepam (VALIUM) 5 MG tablet Take 1 tablet 30-min prior to MRI.  Do not drive while  taking this medication. (Patient taking differently: Take 1 tablet 30-min prior to MRI.  Do not drive while taking this medication.) 2 tablet 0   No current facility-administered medications on file prior to visit.    Allergies: No Known Allergies  Vital Signs:  BP (!) 133/91   Pulse (!) 101   Ht 5\' 11"  (1.803 m)   Wt 142 lb 6.4 oz (64.6 kg)   SpO2 97%   BMI 19.86 kg/m    Neurological Exam: MENTAL STATUS including orientation to time, place, person, recent and remote memory, attention span and concentration, language, and fund of knowledge is normal.  Speech is not dysarthric.  CRANIAL NERVES:  No visual field defects.  Pupils equal round and reactive to light.  Normal conjugate, extra-ocular eye movements in all directions of gaze.  No ptosis.  Face is symmetric.   MOTOR:  Severe intrinsic hand atrophy, fasciculations or abnormal movements.  No pronator drift.   Upper Extremity:  Right  Left  Deltoid  5/5   5/5   Biceps  5/5   5/5   Triceps  5/5   5/5   Infraspinatus 5/5  5/5  Medial pectoralis 5/5  5/5  Wrist extensors  4/5   3/5   Wrist flexors  4/5   4/5   Finger extensors  3/5   4/5   Finger flexors  3/5   3/5   Dorsal interossei  3/5   3/5   Abductor pollicis  3/5   3/5   Tone (Ashworth scale)  0  0   Lower Extremity:  Right  Left  Hip flexors  5/5   4/5   Hip extensors  5/5   4/5   Adductor 5/5  4/5  Abductor 5/5  4/5  Knee flexors  5/5   4/5   Knee extensors  5/5   4/5   Dorsiflexors  5/5   4/5   Plantarflexors  5/5   4/5   Toe extensors  5/5   4/5   Toe flexors  5/5   4/5   Tone (Ashworth scale)  0  0   MSRs: Reflexes are 2+/4 in the arms and absent in the legs  SENSORY:  Absent below the knees to vibration, temperature reduced in the hands  COORDINATION/GAIT:  Finger tapping is slowed bilaterally due to weakness. Gait is wide-based, stooped, unassisted  Data: NCS/EMG of the arms 08/24/2020: 1. Bilateral ulnar neuropathy with slowing across the elbow,  purely demyelinating, moderate. 2. There is no evidence of carpal tunnel syndrome or sensorimotor polyneuropathy affecting the upper extremities.   MRI lumbar spine wo contrast 02/16/2015: 1. Lumbar spondylosis, degenerative disc disease, and congenitally short pedicles cause prominent impingement at L2-3, L3-4, and L4-5 ; moderate impingement at L5-S1;  and mild impingement at T11-12, as detailed above. 2. Despite efforts by the technologist and patient, motion artifact is present on today's exam and could not be eliminated. This reduces exam sensitivity and specificity.  IMPRESSION/PLAN: 1.  Bilateral hand weakness concerning for cervical canal stenosis.  NCS/EMG shows bilateral ulnar neuropathy at the elbow, moderate, no evidence of neuropathy.  His weakness extends beyond the ulnar distribution, so need to look for compressive pathology in the cervical spine  - MRI cervical spine  - Strategies to avoid nerve compression at the elbow discussed   - Avoid leaning on elbows and minimize over flexing  2.  Lumbar spondylosis with multilevel nerve root impingement.  Exam with diffuse asymmetric left leg weakness  - MRI lumbar spine scheduled for 4/23  - If there is progression of degenerative changes, he will need to see spine specialist  3.  Alcohol-induced neuropathy affecting the feet  - Counseled on alcohol cessation at length  - Start using cane  - PT declined  - Patient educated on daily foot inspection, fall prevention, and safety precautions around the home.  4.  Thiamine deficiency  - Continue thiamine 100mg  daily  Further recommendations pending results.   Thank you for allowing me to participate in patient's care.  If I can answer any additional questions, I would be pleased to do so.    Sincerely,    Kamarii Buren K. , DO

## 2020-09-08 ENCOUNTER — Encounter: Payer: Self-pay | Admitting: Neurology

## 2020-09-08 ENCOUNTER — Ambulatory Visit (INDEPENDENT_AMBULATORY_CARE_PROVIDER_SITE_OTHER): Payer: Medicare Other | Admitting: Neurology

## 2020-09-08 ENCOUNTER — Other Ambulatory Visit: Payer: Self-pay

## 2020-09-08 VITALS — BP 133/91 | HR 101 | Ht 71.0 in | Wt 142.4 lb

## 2020-09-08 DIAGNOSIS — G621 Alcoholic polyneuropathy: Secondary | ICD-10-CM | POA: Diagnosis not present

## 2020-09-08 DIAGNOSIS — G5623 Lesion of ulnar nerve, bilateral upper limbs: Secondary | ICD-10-CM

## 2020-09-08 DIAGNOSIS — M4802 Spinal stenosis, cervical region: Secondary | ICD-10-CM

## 2020-09-08 DIAGNOSIS — M4307 Spondylolysis, lumbosacral region: Secondary | ICD-10-CM | POA: Diagnosis not present

## 2020-09-08 DIAGNOSIS — R29898 Other symptoms and signs involving the musculoskeletal system: Secondary | ICD-10-CM

## 2020-09-08 DIAGNOSIS — E639 Nutritional deficiency, unspecified: Secondary | ICD-10-CM | POA: Diagnosis not present

## 2020-09-08 NOTE — Patient Instructions (Addendum)
1.  MRI cervical spine without contrast will be ordered 2.  Please go to your scheduled appointment this Saturday 09/10/2020 at 6:10p for MRI lumbar spine.  Take valium 5mg  at 5:45p (about 30-min prior your MRI) 3.  Avoid leaning on your elbow and avoid over flexing the elbows 4.  Please start to reduce alcohol consumption like I discussed and consider going to rehab   We will call you with the results and let you know the next step

## 2020-09-10 ENCOUNTER — Other Ambulatory Visit: Payer: Self-pay

## 2020-09-10 ENCOUNTER — Ambulatory Visit
Admission: RE | Admit: 2020-09-10 | Discharge: 2020-09-10 | Disposition: A | Discharge status: Home / Self Care | Payer: Medicare Other | Source: Ambulatory Visit | Attending: Neurology | Admitting: Neurology

## 2020-09-10 DIAGNOSIS — E639 Nutritional deficiency, unspecified: Secondary | ICD-10-CM

## 2020-09-10 DIAGNOSIS — M48061 Spinal stenosis, lumbar region without neurogenic claudication: Secondary | ICD-10-CM | POA: Diagnosis not present

## 2020-09-10 DIAGNOSIS — R29898 Other symptoms and signs involving the musculoskeletal system: Secondary | ICD-10-CM

## 2020-09-10 DIAGNOSIS — G621 Alcoholic polyneuropathy: Secondary | ICD-10-CM

## 2020-09-10 DIAGNOSIS — M545 Low back pain, unspecified: Secondary | ICD-10-CM | POA: Diagnosis not present

## 2020-09-10 DIAGNOSIS — M4307 Spondylolysis, lumbosacral region: Secondary | ICD-10-CM

## 2020-09-10 DIAGNOSIS — M4807 Spinal stenosis, lumbosacral region: Secondary | ICD-10-CM

## 2020-09-12 NOTE — Progress Notes (Signed)
LMVOM to call the office back.

## 2020-09-13 ENCOUNTER — Telehealth: Payer: Self-pay

## 2020-09-13 DIAGNOSIS — M4307 Spondylolysis, lumbosacral region: Secondary | ICD-10-CM

## 2020-09-13 DIAGNOSIS — R29898 Other symptoms and signs involving the musculoskeletal system: Secondary | ICD-10-CM

## 2020-09-13 NOTE — Telephone Encounter (Signed)
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  Please refer to spine specialist (such as [facility]).

## 2020-09-13 NOTE — Telephone Encounter (Signed)
Pt agrees referral to [facility] faxed.

## 2020-09-19 ENCOUNTER — Other Ambulatory Visit: Payer: Self-pay

## 2020-09-27 ENCOUNTER — Telehealth: Payer: Self-pay | Admitting: Neurology

## 2020-09-27 ENCOUNTER — Other Ambulatory Visit: Payer: Self-pay | Admitting: *Deleted

## 2020-09-27 ENCOUNTER — Other Ambulatory Visit: Payer: Self-pay

## 2020-09-27 ENCOUNTER — Ambulatory Visit
Admission: RE | Admit: 2020-09-27 | Discharge: 2020-09-27 | Disposition: A | Discharge status: Home / Self Care | Payer: Medicare Other | Source: Ambulatory Visit | Attending: Neurology | Admitting: Neurology

## 2020-09-27 DIAGNOSIS — M4802 Spinal stenosis, cervical region: Secondary | ICD-10-CM

## 2020-09-27 DIAGNOSIS — G5623 Lesion of ulnar nerve, bilateral upper limbs: Secondary | ICD-10-CM

## 2020-09-27 DIAGNOSIS — R29898 Other symptoms and signs involving the musculoskeletal system: Secondary | ICD-10-CM

## 2020-09-27 DIAGNOSIS — G959 Disease of spinal cord, unspecified: Secondary | ICD-10-CM

## 2020-09-27 DIAGNOSIS — E639 Nutritional deficiency, unspecified: Secondary | ICD-10-CM

## 2020-09-27 DIAGNOSIS — M4307 Spondylolysis, lumbosacral region: Secondary | ICD-10-CM

## 2020-09-27 DIAGNOSIS — G621 Alcoholic polyneuropathy: Secondary | ICD-10-CM

## 2020-09-27 NOTE — Telephone Encounter (Signed)
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  He will be referred to [facility] for surgical evaluation.

## 2020-09-27 NOTE — Telephone Encounter (Signed)
Sent/faxed referral--[facility] fax [phone]. With last note attach.

## 2020-10-18 ENCOUNTER — Ambulatory Visit: Payer: Medicare Other | Admitting: Family Medicine

## 2020-10-18 ENCOUNTER — Emergency Department (HOSPITAL_COMMUNITY)
Admission: EM | Admit: 2020-10-18 | Discharge: 2020-10-18 | Discharge status: Against Medical Advice | Payer: Medicare Other

## 2020-10-18 NOTE — ED Notes (Signed)
Called and unable to locate in lobby 

## 2020-10-19 ENCOUNTER — Other Ambulatory Visit: Payer: Self-pay

## 2020-10-19 ENCOUNTER — Ambulatory Visit (HOSPITAL_COMMUNITY)
Admission: EM | Admit: 2020-10-19 | Discharge: 2020-10-19 | Disposition: A | Discharge status: Home / Self Care | Payer: Medicare Other | Attending: Emergency Medicine | Admitting: Emergency Medicine

## 2020-10-19 ENCOUNTER — Encounter (HOSPITAL_COMMUNITY): Payer: Self-pay

## 2020-10-19 DIAGNOSIS — M5431 Sciatica, right side: Secondary | ICD-10-CM | POA: Diagnosis not present

## 2020-10-19 MED ORDER — NAPROXEN 500 MG PO TABS
500.0000 mg | ORAL_TABLET | Freq: Two times a day (BID) | ORAL | 0 refills | Status: DC
  Filled 2020-10-19: qty 30, 15d supply, fill #0

## 2020-10-19 NOTE — Discharge Instructions (Addendum)
Take the Naproxen twice a day for the next few days.    Rest.  Do the exercises described in the handout.    You an use heat, ice, or alternate heat and ice for comfort.   Follow up with your primary care provider, orthopedics/sports medicine, or go to the Emergency Department if symptoms worsen or do not improve in the next few days.

## 2020-10-19 NOTE — ED Provider Notes (Signed)
[facility]    CSN: [id number] Arrival date & time: 10/19/20  9987      History   Chief Complaint Chief Complaint  Patient presents with  . Back Pain    HPI Leonard Ramos is a 65 y.o. male.   Patient here for evaluation of back pain that has been ongoing for the past 2 days.  Reports pain located in lower right back and radiates into hip.  Denies any trauma, injury, or other precipitating event.  Denies any specific alleviating or aggravating factors.  Denies any dysuria, urgency, or frequency.  Has taken OTC medication with minimal relief.   Denies any fevers, chest pain, shortness of breath, N/V/D, numbness, tingling, weakness, abdominal pain, or headaches.    The history is provided by the patient.  Back Pain Associated symptoms: no dysuria     Past Medical History:  Diagnosis Date  . Alcohol use   . Anxiety   . Hyperlipidemia   . Liver disease   . Neuropathy   . Periodontitis, chronic, generalized   . Tobacco use   . Vitamin D deficiency 03/2019    Patient Active Problem List   Diagnosis Date Noted  . Tobacco use 07/12/2019  . History of hyperglycemia 07/12/2019  . Erectile dysfunction 07/12/2019  . Chronic left-sided low back pain without sciatica 04/10/2019  . Alcohol use 04/10/2019  . Poor dentition 04/10/2019  . Chronic dental pain 04/10/2019  . Urinary frequency 04/10/2019  . Aortic atherosclerosis (HCC) 03/19/2017  . Generalized anxiety disorder 10/07/2015  . Depression 10/07/2015  . Neuropathy 09/06/2015  . Loss of weight 09/06/2015  . Tobacco dependence 09/06/2015  . Bowel incontinence 03/09/2015  . ETOH abuse 03/09/2015  . Left sided numbness 03/08/2015  . Left-sided weakness 03/08/2015  . Absence of bladder continence 03/08/2015    Past Surgical History:  Procedure Laterality Date  . MULTIPLE EXTRACTIONS WITH ALVEOLOPLASTY Bilateral 09/04/2019   Procedure: MULTIPLE EXTRACTION W/ ALVEOLOPLASTY, Removal of left maxillary buccal  exostosis;  Surgeon: Ocie Doyne, DDS;  Location: [facility] OR;  Service: Oral Surgery;  Laterality: Bilateral;  . NO PAST SURGERIES         Home Medications    Prior to Admission medications   Medication Sig Start Date End Date Taking? Authorizing Provider  naproxen (NAPROSYN) 500 MG tablet Take 1 tablet (500 mg total) by mouth 2 (two) times daily. 10/19/20  Yes Ivette Loyal, NP  aspirin 81 MG chewable tablet Chew 81 mg by mouth daily.    [provider]  busPIRone (BUSPAR) 10 MG tablet TAKE 1 TABLET (10 MG TOTAL) BY MOUTH 2 (TWO) TIMES DAILY. 04/18/20 04/18/21  Kallie Locks, FNP  Carboxymethylcellul-Glycerin (LUBRICATING EYE DROPS OP) Place 1 drop into both eyes daily as needed (dry eyes).    [provider]  chlorhexidine (PERIDEX) 0.12 % solution Use as directed 15 mLs in the mouth or throat daily. Patient not taking: Reported on 09/08/2020 04/29/19   Kallie Locks, FNP  diazepam (VALIUM) 5 MG tablet Take 1 tablet 30-min prior to MRI.  Do not drive while taking this medication. Patient taking differently: Take 1 tablet 30-min prior to MRI.  Do not drive while taking this medication. 08/31/20   Kallie Locks, FNP  diclofenac Sodium (VOLTAREN) 1 % GEL APPLY 4 GRAMS TOPICALLY 4 (FOUR) TIMES DAILY. 04/18/20 04/18/21  Kallie Locks, FNP  FLUoxetine (PROZAC) 40 MG capsule TAKE 1 CAPSULE (40 MG TOTAL) BY MOUTH DAILY. 04/18/20 04/18/21  Bradly Chris,  Rolm Gala, FNP  folic acid (FOLVITE) 1 MG tablet Take 1 tablet (1 mg total) by mouth daily. 04/18/20   Kallie Locks, FNP  gabapentin (NEURONTIN) 600 MG tablet TAKE 1 TABLET (600 MG TOTAL) BY MOUTH 3 (THREE) TIMES DAILY. 07/21/20 07/21/21  Kallie Locks, FNP  lidocaine (LIDODERM) 5 % Place 1 patch onto the skin daily. Remove & Discard patch within 12 hours or as directed by MD 01/08/20   Caccavale, Sophia, PA-C  pantoprazole (PROTONIX) 20 MG tablet TAKE 1 TABLET (20 MG TOTAL) BY MOUTH DAILY FOR 14 DAYS. 08/19/20 08/19/21   Couture, Cortni S, PA-C  pravastatin (PRAVACHOL) 20 MG tablet TAKE 1 TABLET (20 MG TOTAL) BY MOUTH DAILY. 04/18/20 04/18/21  Kallie Locks, FNP  pregabalin (LYRICA) 25 MG capsule TAKE 1 CAPSULE (25 MG TOTAL) BY MOUTH 2 (TWO) TIMES DAILY. 04/18/20 10/17/20  Kallie Locks, FNP  sildenafil (VIAGRA) 100 MG tablet Take 1 tablet (100 mg total) by mouth daily as needed for erectile dysfunction. 04/18/20   Kallie Locks, FNP  sucralfate (CARAFATE) 1 GM/10ML suspension TAKE 10 MLS (1 G TOTAL) BY MOUTH 4 (FOUR) TIMES DAILY - WITH MEALS AND AT BEDTIME. 08/19/20 08/19/21  Couture, Cortni S, PA-C  thiamine (VITAMIN B-1) 100 MG tablet Take 1 tablet (100 mg total) by mouth daily. 04/29/19   Kallie Locks, FNP  Vitamin D, Ergocalciferol, (DRISDOL) 1.25 MG (50000 UNIT) CAPS capsule Take 1 capsule (50,000 Units total) by mouth every 7 (seven) days. 04/25/20   Kallie Locks, FNP    Family History Family History  Family history unknown: Yes    Social History Social History   Tobacco Use  . Smoking status: Current Every Day Smoker    Packs/day: 1.00    Types: Cigarettes  . Smokeless tobacco: Never Used  Vaping Use  . Vaping Use: Never used  Substance Use Topics  . Alcohol use: Yes    Alcohol/week: 40.0 standard drinks    Types: 40 Standard drinks or equivalent per week    Comment: 1-2 beers daily  . Drug use: Yes    Types: Marijuana    Comment: last use 09/02/2019     Allergies   Patient has no known allergies.   Review of Systems Review of Systems  Genitourinary: Negative for dysuria, flank pain, frequency, hematuria and urgency.  Musculoskeletal: Positive for back pain.  All other systems reviewed and are negative.    Physical Exam Triage Vital Signs ED Triage Vitals  Enc Vitals Group     BP 10/19/20 0840 122/86     Pulse Rate 10/19/20 0840 99     Resp 10/19/20 0840 17     Temp 10/19/20 0840 98 F (36.7 C)     Temp Source 10/19/20 0840 Oral     SpO2 10/19/20 0840 97  %     Weight --      Height --      Head Circumference --      Peak Flow --      Pain Score 10/19/20 0839 10     Pain Loc --      Pain Edu? --      Excl. in GC? --    No data found.  Updated Vital Signs BP 122/86 (BP Location: Right Arm)   Pulse 99   Temp 98 F (36.7 C) (Oral)   Resp 17   SpO2 97%   Visual Acuity Right Eye Distance:   Left Eye Distance:  Bilateral Distance:    Right Eye Near:   Left Eye Near:    Bilateral Near:     Physical Exam Vitals and nursing note reviewed.  Constitutional:      General: He is not in acute distress.    Appearance: Normal appearance. He is not ill-appearing, toxic-appearing or diaphoretic.  HENT:     Head: Normocephalic and atraumatic.  Eyes:     Conjunctiva/sclera: Conjunctivae normal.  Cardiovascular:     Rate and Rhythm: Normal rate.     Pulses: Normal pulses.  Pulmonary:     Effort: Pulmonary effort is normal.  Abdominal:     General: Abdomen is flat.     Tenderness: There is no right CVA tenderness or left CVA tenderness.  Musculoskeletal:     Cervical back: Normal range of motion.     Lumbar back: Tenderness present. No swelling, deformity or bony tenderness. Normal range of motion. Positive right straight leg raise test.  Skin:    General: Skin is warm and dry.  Neurological:     General: No focal deficit present.     Mental Status: He is alert and oriented to person, place, and time.  Psychiatric:        Mood and Affect: Mood normal.      UC Treatments / Results  Labs (all labs ordered are listed, but only abnormal results are displayed) Labs Reviewed - No data to display  EKG   Radiology No results found.  Procedures Procedures (including critical care time)  Medications Ordered in UC Medications - No data to display  Initial Impression / Assessment and Plan / UC Course  I have reviewed the triage vital signs and the nursing notes.  Pertinent labs & imaging results that were available  during my care of the patient were reviewed by me and considered in my medical decision making (see chart for details).    Assessment negative for red flags or concerns.  Likely sciatica.  Will treat with naproxen twice a day and then as needed.  Encouraged fluids and rest.  Given gentle stretches and exercises to complete.  May use heat, ice, or alternate between heat and ice.  Follow up with primary care or orthopedics if symptoms do not improve.  Follow up with the ED if symptoms worsen.   Final Clinical Impressions(s) / UC Diagnoses   Final diagnoses:  Sciatica of right side     Discharge Instructions     Take the Naproxen twice a day for the next few days.    Rest.  Do the exercises described in the handout.    You an use heat, ice, or alternate heat and ice for comfort.   Follow up with your primary care provider, orthopedics/sports medicine, or go to the Emergency Department if symptoms worsen or do not improve in the next few days.     ED Prescriptions    Medication Sig Dispense Auth. Provider   naproxen (NAPROSYN) 500 MG tablet Take 1 tablet (500 mg total) by mouth 2 (two) times daily. 30 tablet Ivette Loyal, NP     PDMP not reviewed this encounter.   Ivette Loyal, NP 10/19/20 [phone]

## 2020-10-19 NOTE — ED Triage Notes (Signed)
Pt in with c/o right lower back pain x 4 days  Pt has taken tylenol with no relief   Denies any urinary sx or strenuous activity

## 2020-10-26 DIAGNOSIS — G992 Myelopathy in diseases classified elsewhere: Secondary | ICD-10-CM | POA: Diagnosis not present

## 2020-10-26 DIAGNOSIS — M4802 Spinal stenosis, cervical region: Secondary | ICD-10-CM | POA: Diagnosis not present

## 2020-10-28 ENCOUNTER — Other Ambulatory Visit: Payer: Self-pay | Admitting: Neurosurgery

## 2020-10-31 ENCOUNTER — Other Ambulatory Visit: Payer: Self-pay

## 2020-10-31 DIAGNOSIS — Z20822 Contact with and (suspected) exposure to covid-19: Secondary | ICD-10-CM | POA: Diagnosis not present

## 2020-10-31 MED FILL — Gabapentin Tab 600 MG: ORAL | 90 days supply | Qty: 270 | Fill #0 | Status: AC

## 2020-11-01 ENCOUNTER — Other Ambulatory Visit: Payer: Self-pay

## 2020-11-02 DIAGNOSIS — Z20822 Contact with and (suspected) exposure to covid-19: Secondary | ICD-10-CM | POA: Diagnosis not present

## 2020-11-03 ENCOUNTER — Ambulatory Visit: Payer: Medicare Other

## 2020-11-04 DIAGNOSIS — Z20822 Contact with and (suspected) exposure to covid-19: Secondary | ICD-10-CM | POA: Diagnosis not present

## 2020-11-09 NOTE — Progress Notes (Signed)
Surgical Instructions    Your procedure is scheduled on Monday, June 27th.  Report to [facility] Main Entrance "A" at 9:10 A.M., then check in with the Admitting office.  Call this number if you have problems the morning of surgery:  [phone]   If you have any questions prior to your surgery date call [phone]: Open Monday-Friday 8am-4pm    Remember:  Do not eat or drink after midnight the night before your surgery     Take these medicines the morning of surgery with A SIP OF WATER  busPIRone (BUSPAR)  FLUoxetine (PROZAC) gabapentin (NEURONTIN) pantoprazole (PROTONIX) pravastatin (PRAVACHOL) pregabalin (LYRICA)   Take these medications AS NEEDED: Carboxymethylcellul-Glycerin (LUBRICATING EYE DROPS OP)    As of today, STOP taking any Aspirin (unless otherwise instructed by your surgeon) Aleve, Naproxen, Ibuprofen, Motrin, Advil, Goody's, BC's, diclofenac Sodium (VOLTAREN), all herbal medications, fish oil, and all vitamins.          Do not wear jewelry. Do not wear lotions, powders, colognes, or deodorant. Do not shave 48 hours prior to surgery.  Men may shave face and neck. Do not bring valuables to the hospital.              [facility] is not responsible for any belongings or valuables.  Do NOT Smoke (Tobacco/Vaping) or drink Alcohol 24 hours prior to your procedure If you use a CPAP at night, you may bring all equipment for your overnight stay.   Contacts, glasses, dentures or bridgework may not be worn into surgery, please bring cases for these belongings   For patients admitted to the hospital, discharge time will be determined by your treatment team.   ONLY 1 SUPPORT PERSON MAY BE PRESENT WHILE YOU ARE IN SURGERY. IF YOU ARE TO BE ADMITTED ONCE YOU ARE IN YOUR ROOM YOU WILL BE ALLOWED TWO (2) VISITORS.  Minor children may have two parents present. Special consideration for safety and communication needs will be reviewed on a case by case basis.  Special  instructions:    Oral Hygiene is also important to reduce your risk of infection.  Remember - BRUSH YOUR TEETH THE MORNING OF SURGERY WITH YOUR REGULAR TOOTHPASTE   [place]- Preparing For Surgery  Before surgery, you can play an important role. Because skin is not sterile, your skin needs to be as free of germs as possible. You can reduce the number of germs on your skin by washing with CHG (chlorahexidine gluconate) Soap before surgery.  CHG is an antiseptic cleaner which kills germs and bonds with the skin to continue killing germs even after washing.     Please do not use if you have an allergy to CHG or antibacterial soaps. If your skin becomes reddened/irritated stop using the CHG.  Do not shave (including legs and underarms) for at least 48 hours prior to first CHG shower. It is OK to shave your face.  Please follow these instructions carefully.     Shower the NIGHT BEFORE SURGERY and the MORNING OF SURGERY with CHG Soap.   If you chose to wash your hair, wash your hair first as usual with your normal shampoo. After you shampoo, rinse your hair and body thoroughly to remove the shampoo.  Then [employer] and genitals (private parts) with your normal soap and rinse thoroughly to remove soap.  After that Use CHG Soap as you would any other liquid soap. You can apply CHG directly to the skin and wash gently with a scrungie  or a clean washcloth.   Apply the CHG Soap to your body ONLY FROM THE NECK DOWN.  Do not use on open wounds or open sores. Avoid contact with your eyes, ears, mouth and genitals (private parts). Wash Face and genitals (private parts)  with your normal soap.   Wash thoroughly, paying special attention to the area where your surgery will be performed.  Thoroughly rinse your body with warm water from the neck down.  DO NOT shower/wash with your normal soap after using and rinsing off the CHG Soap.  Pat yourself dry with a CLEAN TOWEL.  Wear CLEAN PAJAMAS to bed the  night before surgery  Place CLEAN SHEETS on your bed the night before your surgery  DO NOT SLEEP WITH PETS.   Day of Surgery:  Take a shower with CHG soap. Wear Clean/Comfortable clothing the morning of surgery Do not apply any deodorants/lotions/powders/colognes.   Remember to brush your teeth WITH YOUR REGULAR TOOTHPASTE.   Please read over the following fact sheets that you were given.

## 2020-11-10 ENCOUNTER — Encounter (HOSPITAL_COMMUNITY)
Admission: RE | Admit: 2020-11-10 | Discharge: 2020-11-10 | Disposition: A | Discharge status: Home / Self Care | Payer: Medicare Other | Source: Ambulatory Visit | Attending: Neurosurgery | Admitting: Neurosurgery

## 2020-11-10 ENCOUNTER — Encounter (HOSPITAL_COMMUNITY): Payer: Self-pay

## 2020-11-10 ENCOUNTER — Encounter (HOSPITAL_COMMUNITY): Payer: Self-pay | Admitting: Vascular Surgery

## 2020-11-10 ENCOUNTER — Other Ambulatory Visit: Payer: Self-pay

## 2020-11-10 DIAGNOSIS — G992 Myelopathy in diseases classified elsewhere: Secondary | ICD-10-CM | POA: Diagnosis not present

## 2020-11-10 DIAGNOSIS — K769 Liver disease, unspecified: Secondary | ICD-10-CM | POA: Diagnosis not present

## 2020-11-10 DIAGNOSIS — Z20822 Contact with and (suspected) exposure to covid-19: Secondary | ICD-10-CM | POA: Diagnosis not present

## 2020-11-10 DIAGNOSIS — Z01818 Encounter for other preprocedural examination: Secondary | ICD-10-CM | POA: Diagnosis not present

## 2020-11-10 DIAGNOSIS — Z79899 Other long term (current) drug therapy: Secondary | ICD-10-CM | POA: Diagnosis not present

## 2020-11-10 DIAGNOSIS — M4802 Spinal stenosis, cervical region: Secondary | ICD-10-CM | POA: Insufficient documentation

## 2020-11-10 DIAGNOSIS — F1010 Alcohol abuse, uncomplicated: Secondary | ICD-10-CM | POA: Insufficient documentation

## 2020-11-10 DIAGNOSIS — F1410 Cocaine abuse, uncomplicated: Secondary | ICD-10-CM | POA: Insufficient documentation

## 2020-11-10 DIAGNOSIS — E785 Hyperlipidemia, unspecified: Secondary | ICD-10-CM | POA: Insufficient documentation

## 2020-11-10 DIAGNOSIS — Z7982 Long term (current) use of aspirin: Secondary | ICD-10-CM | POA: Insufficient documentation

## 2020-11-10 LAB — CBC WITH DIFFERENTIAL/PLATELET
Abs Immature Granulocytes: 0.02 10*3/uL (ref 0.00–0.07)
Basophils Absolute: 0 10*3/uL (ref 0.0–0.1)
Basophils Relative: 1 %
Eosinophils Absolute: 0.1 10*3/uL (ref 0.0–0.5)
Eosinophils Relative: 4 %
HCT: 46.3 % (ref 39.0–52.0)
Hemoglobin: 15.9 g/dL (ref 13.0–17.0)
Immature Granulocytes: 1 %
Lymphocytes Relative: 38 %
Lymphs Abs: 1.5 10*3/uL (ref 0.7–4.0)
MCH: 32.3 pg (ref 26.0–34.0)
MCHC: 34.3 g/dL (ref 30.0–36.0)
MCV: 93.9 fL (ref 80.0–100.0)
Monocytes Absolute: 0.5 10*3/uL (ref 0.1–1.0)
Monocytes Relative: 14 %
Neutro Abs: 1.7 10*3/uL (ref 1.7–7.7)
Neutrophils Relative %: 42 %
Platelets: 209 10*3/uL (ref 150–400)
RBC: 4.93 MIL/uL (ref 4.22–5.81)
RDW: 14.3 % (ref 11.5–15.5)
WBC: 3.9 10*3/uL — ABNORMAL LOW (ref 4.0–10.5)
nRBC: 0 % (ref 0.0–0.2)

## 2020-11-10 LAB — TYPE AND SCREEN
ABO/RH(D): O POS
Antibody Screen: NEGATIVE

## 2020-11-10 LAB — COMPREHENSIVE METABOLIC PANEL
ALT: 35 U/L (ref 0–44)
AST: 42 U/L — ABNORMAL HIGH (ref 15–41)
Albumin: 3.6 g/dL (ref 3.5–5.0)
Alkaline Phosphatase: 64 U/L (ref 38–126)
Anion gap: 10 (ref 5–15)
BUN: <5 mg/dL — ABNORMAL LOW (ref 8–23)
CO2: 20 mmol/L — ABNORMAL LOW (ref 22–32)
Calcium: 8.3 mg/dL — ABNORMAL LOW (ref 8.9–10.3)
Chloride: 105 mmol/L (ref 98–111)
Creatinine, Ser: 0.68 mg/dL (ref 0.61–1.24)
GFR, Estimated: >60 mL/min (ref >60)
Glucose, Bld: 71 mg/dL (ref 70–99)
Potassium: 4 mmol/L (ref 3.5–5.1)
Sodium: 135 mmol/L (ref 135–145)
Total Bilirubin: 0.8 mg/dL (ref 0.3–1.2)
Total Protein: 6.4 g/dL — ABNORMAL LOW (ref 6.5–8.1)

## 2020-11-10 LAB — SARS CORONAVIRUS 2 (TAT 6-24 HRS): SARS Coronavirus 2: NEGATIVE

## 2020-11-10 LAB — SURGICAL PCR SCREEN
MRSA, PCR: NEGATIVE
Staphylococcus aureus: NEGATIVE

## 2020-11-10 NOTE — Progress Notes (Signed)
Anesthesia PAT Evaluation:   Case: [id number] Date/Time: 11/14/20 1057   Procedure: ACDF - C3-C4 - C4-C5 - C5-C6   Anesthesia type: General   Pre-op diagnosis: Stenosis - myelopathy   [facility]: [facility] ROOM 20 / [facility]   Surgeons: Leonard Sicks, Leonard Ramos       DISCUSSION: Patient is a 65 year old male scheduled for the above procedure.  History includes smoking, polysubstance abuse (cocaine, alcohol, tobacco, marijuana), HLD, liver disease (hepatic steatosis by 2020 [place]), anxiety, cervical myelopathy/neuropathy. Reports full dentures.   He says last crack use was two nights ago. He drinks beers "all day" (says two 6 packs/day) and smokes marijuana daily. No history of DTs. He lives alone. His sisters come by to help and organize medications. Says he mostly just takes gabapentin, because his other medications can make him feel crazy. Says he has not able to be very active for > 1 years due to leg and arm weakness/neuropathy and fall risk.   - ED visit 10/19/20 for back pain. Ice and heat with 2 days Naproxen recommended.  - ED visit 08/19/20 for chest pain in setting of polysubstance abuse with + UDS for cocaine, THC and elevated alcohol level of 194. HS Troponin 9 x 2. He had reported some episodes of chest pain for the past month with this particular episode started after he drank a beer and possibly like a gas pain. EKG showed SR with prolonged QT. Symptoms improved with antacids, Carafate, and GI cocktail. ED provider felt chest symptoms likely related to gastritis/GERD from ETOH use and low suspicion for ACS, PE, dissection, so "Info for pcp given and cards referral given" with return precautions. Patient denied seeing a cardiologist, but did have primary care follow-up with Raliegh Ip, FNP on 08/31/20 for numbness and ED follow-up. She notes he experienced increased chest pain after taking crack cocaine which was unknowingly mixed with fentanyl. He declined referral to drug treatment program. Since chest pain  resolved, she did not refer him to cardiology. MRI ordered to evaluate for left sided low back pain.  I evaluated Leonard Ramos in his PAT visit due to reports of recurrent chest pain since his 08/19/20 ED visit. Denied chest pain and SOB at PAT, although reported he may get chest pains up to a few times per week, last in the early morning hours of 11/10/20. It was somewhat difficult to get him to describe his chest pain history, but thinks he began having intermittent episodes about 120 days ago with the worse episode being on 08/19/20 when he used crack unknowingly laced with fentanyl. He is not active due to the numbness in his bilateral arms and legs. He reports that he is prone to falls given his legs giving out, and his grip can be clumsy. He uses the wall and furniture at home to move around, and used a hospital wheelchair while at his PAT visit. His worse pain at PAT was above his left hip region. He said his middle chest will "just hurt" intermittently every few days. It will usually last for a couple of hours. He called the pains mild (less than his ED visit from 08/19/20), but then rated the pains as 8/10.  He may take Pepsi, ibuprofen or Tylenol when he has the symptoms, but isn't sure if these are what help it go away on its own. He does not think the pain is pleuritic or positional. He doesn't notice any associated symptoms like SOB, diaphoresis, presyncope, palpitations. He denied any known  cardiac history or testing. EKG repeated given recurrent chest pains since April and appeared stable, QTc still prolonged but down to 482 ms. Advised that he seek evaluation in the ED should he get recurrent chest pain. Advised refraining from crack cocaine use, particularly prior to surgery. Long-term would recommend a decrease or avoidance of alcohol, but concern for withdrawal should he change his intake abruptly. He plans to continue alcohol, THC, and cigarettes. Discussed he may require medications (ie CIWA protocol)  while admitted given his regular alcohol use. He reports he does drive, but a friend brought him to his PAT visit.    Reviewed case with anesthesiologist Leonard Rich, Leonard Ramos. Patient with intermittent chest pains over the past ~ 4 months and with polysubstance abuse including alcohol and cocaine. Pain not associated with any particular activity, but is also not very active due to his cervical myelopathic symptoms, which he says have been present for the past year. Recommend preoperative cardiology evaluation for a non-emergent procedure. I have notified Leonard Ramos at Dr. Lindalou Hose office and also told her about his polysubstance abuse including alcohol and cocaine. She will discuss these with Leonard Ramos   11/10/20 preoperative COVID-19 test in process.     VS: BP 112/85   Pulse 95   Temp 36.5 C (Oral)   Resp 17   Ht 5\' 11"  (1.803 m)   Wt 62.7 kg   SpO2 98%   BMI 19.29 kg/m Provider with N95 and universal face mask. Patient with face mask. Heart RRR, no murmur noted. Lungs clear but diminished. He was alert, oriented, and cooperative, able to answer questions appropriately but at times answers were somewhat vague (unsure if it was intentional, or if he just had difficulty qualifying his symptoms). He did not seem acutely intoxicated during evaluation. He did have a 1-2 minute period where he acted drowsy (head nodded off), but was easily arousable. He was given water, and drowsiness resolved (he declined juice and no known DM). He was at baseline when he left PAT visit, and said he just wanted to get home and eat lunch and watch TV. He had a friend with him who was reportedly driving.    PROVIDERS: , FNP is PCP [facility]) [facility], DO is neurologist   LABS: Labs reviewed: Acceptable for surgery. (all labs ordered are listed, but only abnormal results are displayed)  Labs Reviewed  CBC WITH DIFFERENTIAL/PLATELET - Abnormal; Notable for  the following components:      Result Value   WBC 3.9 (*)    All other components within normal limits  COMPREHENSIVE METABOLIC PANEL - Abnormal; Notable for the following components:   CO2 20 (*)    BUN <5 (*)    Calcium 8.3 (*)    Total Protein 6.4 (*)    AST 42 (*)    All other components within normal limits  SURGICAL PCR SCREEN  SARS CORONAVIRUS 2 (TAT 6-24 HRS)  TYPE AND SCREEN     IMAGES: MRI C-spine 09/27/20: IMPRESSION: - Chronic degenerative spondylosis from C3-4 through C5-6 with canal narrowing, most pronounced at C4-5 where the AP diameter is only 6.8 mm. Abnormal T2 signal in the cord throughout the region consistent with compressive myelopathy. - Facet osteoarthritis on the left at C2-3 with edema which could be painful. Foraminal encroachment on the left could compress the left C3 nerve. - Bilateral foraminal stenosis C3-4, C4-5 and C5-6 that could affect the exiting nerves on either  or both sides at those levels. - Lesser foraminal narrowing at C6-7, potentially symptomatic. - Severe bilateral foraminal stenosis at C7-T1 likely to affect either or both C8 nerves. - Discogenic edema within the vertebrae at C3, C4, C5, C7 and T1, likely contributing to regional neck pain.   MRI L-spine 09/10/20: IMPRESSION: 1. Advanced lumbar disc and facet degeneration with progressive severe spinal stenosis at L2-3. 2. Unchanged severe spinal stenosis at L4-5. 3. Mild improvement of severe spinal stenosis at L3-4 due to decreased epidural fat. 4. Severe neural foraminal stenosis on the right at L2-3 and L3-4 and on the left at L5-S1. 5. Moderate to severe bilateral neural foraminal stenosis at L4-5.  1V PCXR 08/19/20: FINDINGS: The heart size and mediastinal contours are within normal limits. Both lungs are clear. No pneumothorax or pleural effusion is noted. The visualized skeletal structures are unremarkable. IMPRESSION: No active disease.  CT Head  01/07/20: IMPRESSION: 1. No acute intracranial pathology. 2. No significant interval change in the appearance of the cystic sellar/suprasellar mass compared to the CT of 2016.    EKG: EKG 11/10/20: Sinus rhythm with Premature atrial complexes Prolonged QT [QT 384, QTc 482 ms] Abnormal ECG  EKG 08/19/20: Sinus rhythm Prolonged QT interval [QT 460, QTc 528 ms] Confirmed by Benjiman Core [phone]) on 08/19/2020 3:20:48 PM   CV: N/A  Past Medical History:  Diagnosis Date   Alcohol use    Anxiety    Hyperlipidemia    Liver disease    Neuropathy    Periodontitis, chronic, generalized    Tobacco use    Vitamin D deficiency 03/2019    Past Surgical History:  Procedure Laterality Date   MULTIPLE EXTRACTIONS WITH ALVEOLOPLASTY Bilateral 09/04/2019   Procedure: MULTIPLE EXTRACTION W/ ALVEOLOPLASTY, Removal of left maxillary buccal exostosis;  Surgeon: Ocie Doyne, DDS;  Location: [facility] OR;  Service: Oral Surgery;  Laterality: Bilateral;   NO PAST SURGERIES      MEDICATIONS:  aspirin 81 MG chewable tablet   busPIRone (BUSPAR) 10 MG tablet   Carboxymethylcellul-Glycerin (LUBRICATING EYE DROPS OP)   chlorhexidine (PERIDEX) 0.12 % solution   diazepam (VALIUM) 5 MG tablet   diclofenac Sodium (VOLTAREN) 1 % GEL   FLUoxetine (PROZAC) 40 MG capsule   folic acid (FOLVITE) 1 MG tablet   gabapentin (NEURONTIN) 600 MG tablet   lidocaine (LIDODERM) 5 %   naproxen (NAPROSYN) 500 MG tablet   pantoprazole (PROTONIX) 20 MG tablet   pravastatin (PRAVACHOL) 20 MG tablet   pregabalin (LYRICA) 25 MG capsule   sildenafil (VIAGRA) 100 MG tablet   sucralfate (CARAFATE) 1 GM/10ML suspension   thiamine (VITAMIN B-1) 100 MG tablet   Vitamin D, Ergocalciferol, (DRISDOL) 1.25 MG (50000 UNIT) CAPS capsule   No current facility-administered medications for this encounter.     Shonna Chock, PA-C Surgical Short Stay/Anesthesiology [facility] Phone [phone] [facility] Phone [phone] 11/10/2020 1:22  PM

## 2020-11-10 NOTE — Progress Notes (Signed)
PCP - Raliegh Ip Cardiologist - denies  PPM/ICD - n/a Device Orders - n/a Rep Notified - n/a  Chest x-ray - 08/19/20 EKG - 08/19/20. Repeated 11/10/20 per AHannah Beat, PA Stress Test - denies ECHO - denies Cardiac Cath - denies  Sleep Study - denies CPAP - n/a  Fasting Blood Sugar - n/a Blood Thinner Instructions: n/a Aspirin Instructions:As of today, STOP taking any Aspirin (unless otherwise instructed by your surgeon)    COVID TEST- 11/10/20 at PAT appointment. Pending.    Anesthesia review: Yes. Called Jaynie Collins, PA and she came over to PAT to assess patient. Patient states he drinks beer "all day, everyday", and smokes marijuana and cigarettes "all day". Patient also states he last did crack/cocaine on Tuesday 11/08/20. Patient states he has some mild chest pain am of 11/11/20. Jaynie Collins, PA aware and assessed patient in PAT. Patient is a poor historian and uncooperative at times when answering questions about medications and medical history.   Patient denies shortness of breath, fever, cough and chest pain at PAT appointment   All instructions explained to the patient, with a verbal understanding of the material. Patient agrees to go over the instructions while at home for a better understanding. Patient also instructed to self quarantine after being tested for COVID-19. The opportunity to ask questions was provided.

## 2020-11-10 NOTE — Anesthesia Preprocedure Evaluation (Deleted)
Anesthesia Evaluation    Airway        Dental   Pulmonary Current Smoker,           Cardiovascular      Neuro/Psych    GI/Hepatic   Endo/Other    Renal/GU      Musculoskeletal   Abdominal   Peds  Hematology   Anesthesia Other Findings   Reproductive/Obstetrics                             Anesthesia Physical Anesthesia Plan  ASA:   Anesthesia Plan:    Post-op Pain Management:    Induction:   PONV Risk Score and Plan:   Airway Management Planned:   Additional Equipment:   Intra-op Plan:   Post-operative Plan:   Informed Consent:   Plan Discussed with:   Anesthesia Plan Comments: (See PAT note written 11/10/2020 by Shonna Chock, PA-C. )        Anesthesia Quick Evaluation

## 2020-11-14 ENCOUNTER — Inpatient Hospital Stay (HOSPITAL_COMMUNITY): Admission: RE | Admit: 2020-11-14 | Payer: Medicare Other | Source: Ambulatory Visit | Admitting: Neurosurgery

## 2020-11-14 ENCOUNTER — Encounter (HOSPITAL_COMMUNITY): Admission: RE | Payer: Self-pay | Source: Ambulatory Visit

## 2020-11-14 SURGERY — ANTERIOR CERVICAL DECOMPRESSION/DISCECTOMY FUSION 3 LEVELS
Anesthesia: General

## 2020-11-18 DIAGNOSIS — Z20822 Contact with and (suspected) exposure to covid-19: Secondary | ICD-10-CM | POA: Diagnosis not present

## 2020-11-24 DIAGNOSIS — Z20822 Contact with and (suspected) exposure to covid-19: Secondary | ICD-10-CM | POA: Diagnosis not present

## 2020-11-28 DIAGNOSIS — Z20822 Contact with and (suspected) exposure to covid-19: Secondary | ICD-10-CM | POA: Diagnosis not present

## 2020-11-29 ENCOUNTER — Telehealth: Payer: Self-pay

## 2020-11-29 NOTE — Telephone Encounter (Signed)
   [facility] Pre-operative Risk Assessment    Patient Name: Leonard Ramos  DOB: 1955/05/31 MRN: [id number]  HEARTCARE STAFF:  - IMPORTANT!!!!!! Under Visit Info/Reason for Call, type in Other and utilize the format Clearance MM/DD/YY or Clearance TBD. Do not use dashes or single digits. - Please review there is not already an duplicate clearance open for this procedure. - If request is for dental extraction, please clarify the # of teeth to be extracted. - If the patient is currently at the dentist's office, call Pre-Op Callback Staff (MA/nurse) to input urgent request.  - If the patient is not currently in the dentist office, please route to the Pre-Op pool.  Request for surgical clearance:  What type of surgery is being performed? C3-4, C4-5, C5-6 anterior cervical fusion  When is this surgery scheduled? Pending Clearance.  What type of clearance is required (medical clearance vs. Pharmacy clearance to hold med vs. Both)? Medical   Are there any medications that need to be held prior to surgery and how long? No  Practice name and name of physician performing surgery? [facility]  What is the office phone number? [phone]   7.   What is the office fax number? [phone]  8.   Anesthesia type (None, local, MAC, general) ? General   Leonard Ramos 11/29/2020, 1:37 PM  _________________________________________________________________   (provider comments below)

## 2020-11-29 NOTE — Telephone Encounter (Signed)
   Name: Leonard Ramos  DOB: 1956-01-25  MRN: [id number]  Primary Cardiologist: None  Chart reviewed as part of pre-operative protocol coverage. Because of ADIT RIDDLES past medical history and time since last visit, he will require a follow-up visit in order to better assess preoperative cardiovascular risk.   Will be a NEW patient appt. He apparently reported some chest tightening at one of his visits with a provider and now needs preop clearance.   Pre-op covering staff: - Please schedule appointment and call patient to inform them. If patient already had an upcoming appointment within acceptable timeframe, please add "pre-op clearance" to the appointment notes so provider is aware. - Please contact requesting surgeon's office via preferred method (i.e, phone, fax) to inform them of need for appointment prior to surgery.  If applicable, this message will also be routed to pharmacy pool and/or primary cardiologist for input on holding anticoagulant/antiplatelet agent as requested below so that this information is available to the clearing provider at time of patient's appointment.   Leonard Rutherford Jamine Wingate, PA  11/29/2020, 2:08 PM

## 2020-11-29 NOTE — Telephone Encounter (Signed)
Spoke with pt who is agreeable to appointment with Dr. Jens Som on 7/15 at 8:30 am for pre-op clearance.  I will fax recommendations to requesting surgeon's office via Epic.

## 2020-11-30 ENCOUNTER — Ambulatory Visit (INDEPENDENT_AMBULATORY_CARE_PROVIDER_SITE_OTHER): Payer: Medicare Other | Admitting: Nurse Practitioner

## 2020-11-30 ENCOUNTER — Other Ambulatory Visit: Payer: Self-pay

## 2020-11-30 ENCOUNTER — Encounter: Payer: Self-pay | Admitting: Nurse Practitioner

## 2020-11-30 VITALS — BP 118/79 | HR 89 | Temp 97.4°F | Ht 71.0 in | Wt 135.1 lb

## 2020-11-30 DIAGNOSIS — K089 Disorder of teeth and supporting structures, unspecified: Secondary | ICD-10-CM | POA: Diagnosis not present

## 2020-11-30 DIAGNOSIS — R2681 Unsteadiness on feet: Secondary | ICD-10-CM

## 2020-11-30 DIAGNOSIS — R630 Anorexia: Secondary | ICD-10-CM

## 2020-11-30 DIAGNOSIS — G629 Polyneuropathy, unspecified: Secondary | ICD-10-CM | POA: Diagnosis not present

## 2020-11-30 DIAGNOSIS — F17200 Nicotine dependence, unspecified, uncomplicated: Secondary | ICD-10-CM

## 2020-11-30 DIAGNOSIS — F411 Generalized anxiety disorder: Secondary | ICD-10-CM | POA: Diagnosis not present

## 2020-11-30 DIAGNOSIS — Z20822 Contact with and (suspected) exposure to covid-19: Secondary | ICD-10-CM | POA: Diagnosis not present

## 2020-11-30 DIAGNOSIS — F32A Depression, unspecified: Secondary | ICD-10-CM

## 2020-11-30 MED ORDER — MIRTAZAPINE 30 MG PO TBDP
30.0000 mg | ORAL_TABLET | Freq: Every day | ORAL | 3 refills | Status: DC
  Filled 2020-11-30: qty 90, 90d supply, fill #0

## 2020-11-30 MED ORDER — CHLORHEXIDINE GLUCONATE 0.12 % MT SOLN
15.0000 mL | Freq: Every day | OROMUCOSAL | 6 refills | Status: DC
  Filled 2020-11-30: qty 473, 30d supply, fill #0

## 2020-11-30 MED ORDER — GABAPENTIN 600 MG PO TABS
600.0000 mg | ORAL_TABLET | Freq: Three times a day (TID) | ORAL | 3 refills | Status: DC
  Filled 2020-11-30 – 2021-01-26 (×2): qty 270, 90d supply, fill #0
  Filled 2021-04-27: qty 270, 90d supply, fill #1
  Filled 2021-08-02: qty 270, 90d supply, fill #0
  Filled 2021-10-24: qty 270, 90d supply, fill #1

## 2020-11-30 MED ORDER — FLUOXETINE HCL 40 MG PO CAPS
ORAL_CAPSULE | ORAL | 0 refills | Status: DC
  Filled 2020-11-30: qty 10, 21d supply, fill #0

## 2020-11-30 NOTE — Progress Notes (Signed)
[facility] [address] [place], [place]  [address] Phone:  [phone]   Fax:  [phone]   Established Patient Office Visit  Subjective:  Patient ID: Leonard Ramos, male    DOB: 1956/05/09  Age: 65 y.o. MRN: [id number]  CC:  Chief Complaint  Patient presents with   Follow-up    3 month FU;Fell yesterday inside home twisted his left ankle    HPI [name] presents for follow up. A former patient of NP Stroud.  He  has a past medical history of Alcohol use, Anxiety, Hyperlipidemia, Liver disease, Neuropathy, Periodontitis, chronic, generalized, Tobacco use, and Vitamin D deficiency (03/2019).   He is going to have a ACDF with Dr Jordan Likes. He has to get cardiac clearance before this can be completed. He Denies headache, dizziness, visual changes, shortness of breath, dyspnea on exertion, chest pain, nausea, vomiting. He did suffer a fall on yesterday and twisted his ankle.  He has some swelling. He reports that it has improved.   He reports a low appetite. He is SP full mouth extraction. He has limited dietary intake due this this. He denies any depression or anxiety . He feels like he is doing well overall but is ready to have the surgery to help with his gait and weakness. He currently lives alone but has a sister that maybe able to assist him post procedure.    Past Medical History:  Diagnosis Date   Alcohol use    Anxiety    Hyperlipidemia    Liver disease    Neuropathy    Periodontitis, chronic, generalized    Tobacco use    Vitamin D deficiency 03/2019    Past Surgical History:  Procedure Laterality Date   MULTIPLE EXTRACTIONS WITH ALVEOLOPLASTY Bilateral 09/04/2019   Procedure: MULTIPLE EXTRACTION W/ ALVEOLOPLASTY, Removal of left maxillary buccal exostosis;  Surgeon: Ocie Doyne, DDS;  Location: [facility] OR;  Service: Oral Surgery;  Laterality: Bilateral;   NO PAST SURGERIES      Family History  Family history unknown: Yes    Social  History   Socioeconomic History   Marital status: Single    Spouse name: Not on file   Number of children: 4   Years of education: Not on file   Highest education level: Not on file  Occupational History   Not on file  Tobacco Use   Smoking status: Every Day    Packs/day: 1.00    Pack years: 0.00    Types: Cigarettes   Smokeless tobacco: Never  Vaping Use   Vaping Use: Never used  Substance and Sexual Activity   Alcohol use: Yes    Alcohol/week: 40.0 standard drinks    Types: 40 Standard drinks or equivalent per week    Comment: States I drink beer all day long   Drug use: Yes    Types: Marijuana    Comment: Patient stated I smoke marijuana everyday   Sexual activity: Yes    Partners: Female  Other Topics Concern   Not on file  Social History Narrative   Right handed   Lives in a 8 story building    Drinks a pepsi occasionally    Social Determinants of Health   Financial Resource Strain: Not on file  Food Insecurity: Not on file  Transportation Needs: Not on file  Physical Activity: Not on file  Stress: Not on file  Social Connections: Not on file  Intimate Partner Violence: Not on file  Outpatient Medications Prior to Visit  Medication Sig Dispense Refill   aspirin 81 MG chewable tablet Chew 81 mg by mouth daily.     busPIRone (BUSPAR) 10 MG tablet TAKE 1 TABLET (10 MG TOTAL) BY MOUTH 2 (TWO) TIMES DAILY. 180 tablet 3   Carboxymethylcellul-Glycerin (LUBRICATING EYE DROPS OP) Place 1 drop into both eyes daily.     diazepam (VALIUM) 5 MG tablet Take 1 tablet 30-min prior to MRI.  Do not drive while taking this medication. (Patient taking differently: Take 1 tablet 30-min prior to MRI.  Do not drive while taking this medication.) 2 tablet 0   folic acid (FOLVITE) 1 MG tablet Take 1 tablet (1 mg total) by mouth daily. 90 tablet 3   lidocaine (LIDODERM) 5 % Place 1 patch onto the skin daily. Remove & Discard patch within 12 hours or as directed by MD 30 patch 0    naproxen (NAPROSYN) 500 MG tablet Take 1 tablet (500 mg total) by mouth 2 (two) times daily. 30 tablet 0   pantoprazole (PROTONIX) 20 MG tablet TAKE 1 TABLET (20 MG TOTAL) BY MOUTH DAILY FOR 14 DAYS. 14 tablet 0   pravastatin (PRAVACHOL) 20 MG tablet TAKE 1 TABLET (20 MG TOTAL) BY MOUTH DAILY. 90 tablet 3   sildenafil (VIAGRA) 100 MG tablet Take 1 tablet (100 mg total) by mouth daily as needed for erectile dysfunction. 10 tablet 3   sucralfate (CARAFATE) 1 GM/10ML suspension TAKE 10 MLS (1 G TOTAL) BY MOUTH 4 (FOUR) TIMES DAILY - WITH MEALS AND AT BEDTIME. 420 mL 0   thiamine (VITAMIN B-1) 100 MG tablet Take 1 tablet (100 mg total) by mouth daily. 30 tablet 6   Vitamin D, Ergocalciferol, (DRISDOL) 1.25 MG (50000 UNIT) CAPS capsule Take 1 capsule (50,000 Units total) by mouth every 7 (seven) days. 5 capsule 3   chlorhexidine (PERIDEX) 0.12 % solution Use as directed 15 mLs in the mouth or throat daily. 473 mL 6   FLUoxetine (PROZAC) 40 MG capsule TAKE 1 CAPSULE (40 MG TOTAL) BY MOUTH DAILY. 90 capsule 3   gabapentin (NEURONTIN) 600 MG tablet TAKE 1 TABLET (600 MG TOTAL) BY MOUTH 3 (THREE) TIMES DAILY. (Patient taking differently: Take 600 mg by mouth 3 (three) times daily.) 270 tablet 3   pregabalin (LYRICA) 25 MG capsule TAKE 1 CAPSULE (25 MG TOTAL) BY MOUTH 2 (TWO) TIMES DAILY. (Patient not taking: Reported on 11/10/2020) 60 capsule 5   diclofenac Sodium (VOLTAREN) 1 % GEL APPLY 4 GRAMS TOPICALLY 4 (FOUR) TIMES DAILY. (Patient not taking: No sig reported) 100 g 6   No facility-administered medications prior to visit.    No Known Allergies  ROS Review of Systems    Objective:    Physical Exam Constitutional:      General: He is not in acute distress.    Appearance: He is not ill-appearing, toxic-appearing or diaphoretic.  HENT:     Head: Normocephalic and atraumatic.     Nose: Nose normal.     Mouth/Throat:     Mouth: Mucous membranes are moist.  Cardiovascular:     Rate and Rhythm:  Normal rate. Rhythm irregular.     Pulses: Normal pulses.     Heart sounds:    Gallop present.  Pulmonary:     Effort: Pulmonary effort is normal.     Comments: Diminished expiration  Abdominal:     General: Abdomen is flat. Bowel sounds are normal.     Palpations: Abdomen is soft.  Musculoskeletal:     Comments: Abnormal gait weakness on left side  Skin:    General: Skin is warm and dry.     Capillary Refill: Capillary refill takes less than 2 seconds.  Neurological:     General: No focal deficit present.     Mental Status: He is alert and oriented to person, place, and time.  Psychiatric:        Mood and Affect: Mood normal.        Behavior: Behavior normal.        Thought Content: Thought content normal.        Judgment: Judgment normal.    BP 118/79 (BP Location: Right Arm, Patient Position: Sitting)   Pulse 89   Temp (!) 97.4 F (36.3 C)   Ht 5\' 11"  (1.803 m)   Wt 135 lb 0.8 oz (61.3 kg)   SpO2 96%   BMI 18.84 kg/m  Wt Readings from Last 3 Encounters:  11/30/20 135 lb 0.8 oz (61.3 kg)  11/10/20 138 lb 4.8 oz (62.7 kg)  09/08/20 142 lb 6.4 oz (64.6 kg)     Health Maintenance Due  Topic Date Due   Zoster Vaccines- Shingrix (1 of 2) Never done   Pneumococcal Vaccine 43-45 Years old (2 - PCV) 03/07/2016   COVID-19 Vaccine (2 - Moderna series) 10/26/2019    There are no preventive care reminders to display for this patient.  Lab Results  Component Value Date   TSH 1.300 04/18/2020   Lab Results  Component Value Date   WBC 3.9 (L) 11/10/2020   HGB 15.9 11/10/2020   HCT 46.3 11/10/2020   MCV 93.9 11/10/2020   PLT 209 11/10/2020   Lab Results  Component Value Date   NA 135 11/10/2020   K 4.0 11/10/2020   CO2 20 (L) 11/10/2020   GLUCOSE 71 11/10/2020   BUN <5 (L) 11/10/2020   CREATININE 0.68 11/10/2020   BILITOT 0.8 11/10/2020   ALKPHOS 64 11/10/2020   AST 42 (H) 11/10/2020   ALT 35 11/10/2020   PROT 6.4 (L) 11/10/2020   ALBUMIN 3.6 11/10/2020    CALCIUM 8.3 (L) 11/10/2020   ANIONGAP 10 11/10/2020   GFR 103.47 04/29/2019   Lab Results  Component Value Date   CHOL 171 04/18/2020   Lab Results  Component Value Date   HDL 93 04/18/2020   Lab Results  Component Value Date   LDLCALC 65 04/18/2020   Lab Results  Component Value Date   TRIG 70 04/18/2020   Lab Results  Component Value Date   CHOLHDL 1.8 04/18/2020   Lab Results  Component Value Date   HGBA1C 5.5 04/10/2019      Assessment & Plan:   Problem List Items Addressed This Visit       Digestive   Poor dentition   Relevant Medications   chlorhexidine (PERIDEX) 0.12 % solution     Other   Tobacco dependence - Primary   Depression Stable  Tapering off the Fluoxetine and replacing with Mirtazapine to help with appetite, sleep and mood   Relevant Medications   mirtazapine (REMERON SOL-TAB) 30 MG disintegrating tablet   FLUoxetine (PROZAC) 40 MG capsule   Other Visit Diagnoses     Pre-operative anxiety       Relevant Medications   mirtazapine (REMERON SOL-TAB) 30 MG disintegrating tablet   FLUoxetine (PROZAC) 40 MG capsule   Neuropathy   Stable    Relevant Medications   gabapentin (NEURONTIN) 600 MG tablet  Gait instability     Walker ordered   Appetite impaired     Mirtazapine to help stimulated appetite        Meds ordered this encounter  Medications   chlorhexidine (PERIDEX) 0.12 % solution    Sig: Use as directed 15 mLs in the mouth or throat daily.    Dispense:  473 mL    Refill:  6   gabapentin (NEURONTIN) 600 MG tablet    Sig: Take 1 tablet (600 mg total) by mouth 3 (three) times daily.    Dispense:  270 tablet    Refill:  3   mirtazapine (REMERON SOL-TAB) 30 MG disintegrating tablet    Sig: Take 1 tablet (30 mg total) by mouth at bedtime.    Dispense:  90 tablet    Refill:  3    Order Specific Question:   Supervising Provider    Answer:   JEGEDE, OLUGBEMIGA E [[id number]]   FLUQuentin Angstoxetine (PROZAC) 40 MG capsule    Sig: Take 1  capsule (40 mg total) by mouth every other day for 7 days, THEN 1 capsule (40 mg total) every 36 (thirty-six) hours for 7 days, THEN 1 capsule (40 mg total) once a week for 7 days.    Dispense:  30 capsule    Refill:  0    Order Specific Question:   Supervising Provider    Answer:   Quentin AngstJEGEDE, OLUGBEMIGA E [[id number]]     Follow-up: Return in about 6 weeks (around 01/11/2021) for Follow-up for depression 99213.    Barbette Merinorystal M Jasiyah Poland, NP

## 2020-11-30 NOTE — Patient Instructions (Addendum)
Health Maintenance, Male Adopting a healthy lifestyle and getting preventive care are important in promoting health and wellness. Ask your health care provider about: The right schedule for you to have regular tests and exams. Things you can do on your own to prevent diseases and keep yourself healthy. What should I know about diet, weight, and exercise? Eat a healthy diet  Eat a diet that includes plenty of vegetables, fruits, low-fat dairy products, and lean protein. Do not eat a lot of foods that are high in solid fats, added sugars, or sodium.  Maintain a healthy weight Body mass index (BMI) is a measurement that can be used to identify possible weight problems. It estimates body fat based on height and weight. Your health care provider can help determine your BMI and help you achieve or maintain ahealthy weight. Get regular exercise Get regular exercise. This is one of the most important things you can do for your health. Most adults should: Exercise for at least 150 minutes each week. The exercise should increase your heart rate and make you sweat (moderate-intensity exercise). Do strengthening exercises at least twice a week. This is in addition to the moderate-intensity exercise. Spend less time sitting. Even light physical activity can be beneficial. Watch cholesterol and blood lipids Have your blood tested for lipids and cholesterol at 64 years of age, then havethis test every 5 years. You may need to have your cholesterol levels checked more often if: Your lipid or cholesterol levels are high. You are older than 64 years of age. You are at high risk for heart disease. What should I know about cancer screening? Many types of cancers can be detected early and may often be prevented. Depending on your health history and family history, you may need to have cancer screening at various ages. This may include screening for: Colorectal cancer. Prostate cancer. Skin cancer. Lung  cancer. What should I know about heart disease, diabetes, and high blood pressure? Blood pressure and heart disease High blood pressure causes heart disease and increases the risk of stroke. This is more likely to develop in people who have high blood pressure readings, are of African descent, or are overweight. Talk with your health care provider about your target blood pressure readings. Have your blood pressure checked: Every 3-5 years if you are 18-39 years of age. Every year if you are 40 years old or older. If you are between the ages of 65 and 75 and are a current or former smoker, ask your health care provider if you should have a one-time screening for abdominal aortic aneurysm (AAA). Diabetes Have regular diabetes screenings. This checks your fasting blood sugar level. Have the screening done: Once every three years after age 45 if you are at a normal weight and have a low risk for diabetes. More often and at a younger age if you are overweight or have a high risk for diabetes. What should I know about preventing infection? Hepatitis B If you have a higher risk for hepatitis B, you should be screened for this virus. Talk with your health care provider to find out if you are at risk forhepatitis B infection. Hepatitis C Blood testing is recommended for: Everyone born from 1945 through 1965. Anyone with known risk factors for hepatitis C. Sexually transmitted infections (STIs) You should be screened each year for STIs, including gonorrhea and chlamydia, if: You are sexually active and are younger than 64 years of age. You are older than 64 years of age   and your health care provider tells you that you are at risk for this type of infection. Your sexual activity has changed since you were last screened, and you are at increased risk for chlamydia or gonorrhea. Ask your health care provider if you are at risk. Ask your health care provider about whether you are at high risk for HIV.  Your health care provider may recommend a prescription medicine to help prevent HIV infection. If you choose to take medicine to prevent HIV, you should first get tested for HIV. You should then be tested every 3 months for as long as you are taking the medicine. Follow these instructions at home: Lifestyle Do not use any products that contain nicotine or tobacco, such as cigarettes, e-cigarettes, and chewing tobacco. If you need help quitting, ask your health care provider. Do not use street drugs. Do not share needles. Ask your health care provider for help if you need support or information about quitting drugs. Alcohol use Do not drink alcohol if your health care provider tells you not to drink. If you drink alcohol: Limit how much you have to 0-2 drinks a day. Be aware of how much alcohol is in your drink. In the [place] one drink equals one 12 oz bottle of beer (355 mL), one 5 oz glass of wine (148 mL), or one 1 oz glass of hard liquor (44 mL). General instructions Schedule regular health, dental, and eye exams. Stay current with your vaccines. Tell your health care provider if: You often feel depressed. You have ever been abused or do not feel safe at home. Summary Adopting a healthy lifestyle and getting preventive care are important in promoting health and wellness. Follow your health care provider's instructions about healthy diet, exercising, and getting tested or screened for diseases. Follow your health care provider's instructions on monitoring your cholesterol and blood pressure. This information is not intended to replace advice given to you by your health care provider. Make sure you discuss any questions you have with your healthcare provider. Document Revised: 04/30/2018 Document Reviewed: 04/30/2018 Elsevier Patient Education  2022 [employer] Inc[employer]  Healthy Eating Following a healthy eating pattern may help you to achieve and maintain a healthy body weight, reduce the risk  of chronic disease, and live a long and productive life. It is important to follow a healthy eating pattern at an appropriate calorie level for your body. Your nutritional needs should be metprimarily through food by choosing a variety of nutrient-rich foods. What are tips for following this plan? Reading food labels Read labels and choose the following: Reduced or low sodium. Juices with 100% fruit juice. Foods with low saturated fats and high polyunsaturated and monounsaturated fats. Foods with whole grains, such as whole wheat, cracked wheat, brown rice, and wild rice. Whole grains that are fortified with folic acid. This is recommended for women who are pregnant or who want to become pregnant. Read labels and avoid the following: Foods with a lot of added sugars. These include foods that contain brown sugar, corn sweetener, corn syrup, dextrose, fructose, glucose, high-fructose corn syrup, honey, invert sugar, lactose, malt syrup, maltose, molasses, raw sugar, sucrose, trehalose, or turbinado sugar. Do not eat more than the following amounts of added sugar per day: 6 teaspoons (25 g) for women. 9 teaspoons (38 g) for men. Foods that contain processed or refined starches and grains. Refined grain products, such as white flour, degermed cornmeal, white bread, and white rice. Shopping Choose nutrient-rich snacks, such as vegetables,  whole fruits, and nuts. Avoid high-calorie and high-sugar snacks, such as potato chips, fruit snacks, and candy. Use oil-based dressings and spreads on foods instead of solid fats such as butter, stick margarine, or cream cheese. Limit pre-made sauces, mixes, and "instant" products such as flavored rice, instant noodles, and ready-made pasta. Try more plant-protein sources, such as tofu, tempeh, black beans, edamame, lentils, nuts, and seeds. Explore eating plans such as the Mediterranean diet or vegetarian diet. Cooking Use oil to saut or stir-fry foods instead  of solid fats such as butter, stick margarine, or lard. Try baking, boiling, grilling, or broiling instead of frying. Remove the fatty part of meats before cooking. Steam vegetables in water or broth. Meal planning  At meals, imagine dividing your plate into fourths: One-half of your plate is fruits and vegetables. One-fourth of your plate is whole grains. One-fourth of your plate is protein, especially lean meats, poultry, eggs, tofu, beans, or nuts. Include low-fat dairy as part of your daily diet.  Lifestyle Choose healthy options in all settings, including home, work, school, restaurants, or stores. Prepare your food safely: Wash your hands after handling raw meats. Keep food preparation surfaces clean by regularly washing with hot, soapy water. Keep raw meats separate from ready-to-eat foods, such as fruits and vegetables. Cook seafood, meat, poultry, and eggs to the recommended internal temperature. Store foods at safe temperatures. In general: Keep cold foods at 49F (4.4C) or below. Keep hot foods at 149F (60C) or above. Keep your freezer at [facility] (-17.8C) or below. Foods are no longer safe to eat when they have been between the temperatures of 40-149F (4.4-60C) for more than 2 hours. What foods should I eat? Fruits Aim to eat 2 cup-equivalents of fresh, canned (in natural juice), or frozen fruits each day. Examples of 1 cup-equivalent of fruit include 1 small apple, 8large strawberries, 1 cup canned fruit,  cup dried fruit, or 1 cup 100% juice. Vegetables Aim to eat 2-3 cup-equivalents of fresh and frozen vegetables each day, including different varieties and colors. Examples of 1 cup-equivalent of vegetables include 2 medium carrots, 2 cups raw, leafy greens, 1 cup choppedvegetable (raw or cooked), or 1 medium baked potato. Grains Aim to eat 6 ounce-equivalents of whole grains each day. Examples of 1 ounce-equivalent of grains include 1 slice of bread, 1 cup  ready-to-eat cereal,3 cups popcorn, or  cup cooked rice, pasta, or cereal. Meats and other proteins Aim to eat 5-6 ounce-equivalents of protein each day. Examples of 1 ounce-equivalent of protein include 1 egg, 1/2 cup nuts or seeds, or 1 tablespoon (16 g) peanut butter. A cut of meat or fish that is the size of a deck of cards is about 3-4 ounce-equivalents. Of the protein you eat each week, try to have at least 8 ounces come from seafood. This includes salmon, trout, herring, and anchovies. Dairy Aim to eat 3 cup-equivalents of fat-free or low-fat dairy each day. Examples of 1 cup-equivalent of dairy include 1 cup (240 mL) milk, 8 ounces (250 g) yogurt,1 ounces (44 g) natural cheese, or 1 cup (240 mL) fortified soy milk. Fats and oils Aim for about 5 teaspoons (21 g) per day. Choose monounsaturated fats, such as canola and olive oils, avocados, peanut butter, and most nuts, or polyunsaturated fats, such as sunflower, corn, and soybean oils, walnuts, pine nuts, sesame seeds, sunflower seeds, and flaxseed. Beverages Aim for six 8-oz glasses of water per day. Limit coffee to three to five 8-oz cups per day. Limit  caffeinated beverages that have added calories, such as soda and energy drinks. Limit alcohol intake to no more than 1 drink a day for nonpregnant women and 2 drinks a day for men. One drink equals 12 oz of beer (355 mL), 5 oz of wine (148 mL), or 1 oz of hard liquor (44 mL). Seasoning and other foods Avoid adding excess amounts of salt to your foods. Try flavoring foods with herbs and spices instead of salt. Avoid adding sugar to foods. Try using oil-based dressings, sauces, and spreads instead of solid fats. This information is based on general [place] nutrition guidelines. For more information, visit [employer]. Exact amounts may vary based on your nutrition needs. Summary A healthy eating plan may help you to maintain a healthy weight, reduce the risk of chronic diseases, and  stay active throughout your life. Plan your meals. Make sure you eat the right portions of a variety of nutrient-rich foods. Try baking, boiling, grilling, or broiling instead of frying. Choose healthy options in all settings, including home, work, school, restaurants, or stores. This information is not intended to replace advice given to you by your health care provider. Make sure you discuss any questions you have with your healthcare provider. Document Revised: 08/19/2017 Document Reviewed: 08/19/2017 Elsevier Patient Education  2022 [employer].

## 2020-12-01 ENCOUNTER — Other Ambulatory Visit: Payer: Self-pay

## 2020-12-01 NOTE — Progress Notes (Signed)
Referring-Henry Pool, MD Reason for referral-preoperative evaluation prior to cervical disc surgery  HPI: 65 year old male for preoperative evaluation prior to cervical disc surgery at request of Julio Sicks, MD. Patient was scheduled to have cervical disc surgery due to extremity weakness.  However at time of evaluation he complained of chest pain.  He describes it as a shocklike pain in his left chest area lasting for approximately 30 minutes without radiation or associated symptoms.  Resolve spontaneously.  He otherwise denies dyspnea on exertion, orthopnea, PND, pedal edema, exertional chest pain or syncope.  Cardiology asked to evaluate.  Current Outpatient Medications  Medication Sig Dispense Refill   aspirin 81 MG chewable tablet Chew 81 mg by mouth daily.     busPIRone (BUSPAR) 10 MG tablet TAKE 1 TABLET (10 MG TOTAL) BY MOUTH 2 (TWO) TIMES DAILY. 180 tablet 3   Carboxymethylcellul-Glycerin (LUBRICATING EYE DROPS OP) Place 1 drop into both eyes daily.     chlorhexidine (PERIDEX) 0.12 % solution Use as directed 15 mLs in the mouth or throat daily. 473 mL 6   FLUoxetine (PROZAC) 40 MG capsule Take 1 capsule (40 mg total) by mouth every other day for 7 days, THEN 1 capsule (40 mg total) every 36 (thirty-six) hours for 7 days, THEN 1 capsule (40 mg total) once a week for 7 days. 30 capsule 0   folic acid (FOLVITE) 1 MG tablet Take 1 tablet (1 mg total) by mouth daily. 90 tablet 3   gabapentin (NEURONTIN) 600 MG tablet Take 1 tablet (600 mg total) by mouth 3 (three) times daily. 270 tablet 3   lidocaine (LIDODERM) 5 % Place 1 patch onto the skin daily. Remove & Discard patch within 12 hours or as directed by MD 30 patch 0   mirtazapine (REMERON SOL-TAB) 30 MG disintegrating tablet Take 1 tablet (30 mg total) by mouth at bedtime. 90 tablet 3   naproxen (NAPROSYN) 500 MG tablet Take 1 tablet (500 mg total) by mouth 2 (two) times daily. 30 tablet 0   pravastatin (PRAVACHOL) 20 MG tablet TAKE 1  TABLET (20 MG TOTAL) BY MOUTH DAILY. 90 tablet 3   sildenafil (VIAGRA) 100 MG tablet Take 1 tablet (100 mg total) by mouth daily as needed for erectile dysfunction. 10 tablet 3   sucralfate (CARAFATE) 1 GM/10ML suspension TAKE 10 MLS (1 G TOTAL) BY MOUTH 4 (FOUR) TIMES DAILY - WITH MEALS AND AT BEDTIME. 420 mL 0   thiamine (VITAMIN B-1) 100 MG tablet Take 1 tablet (100 mg total) by mouth daily. 30 tablet 6   Vitamin D, Ergocalciferol, (DRISDOL) 1.25 MG (50000 UNIT) CAPS capsule Take 1 capsule (50,000 Units total) by mouth every 7 (seven) days. 5 capsule 3   diazepam (VALIUM) 5 MG tablet Take 1 tablet 30-min prior to MRI.  Do not drive while taking this medication. (Patient not taking: Reported on 12/02/2020) 2 tablet 0   pantoprazole (PROTONIX) 20 MG tablet TAKE 1 TABLET (20 MG TOTAL) BY MOUTH DAILY FOR 14 DAYS. (Patient not taking: Reported on 12/02/2020) 14 tablet 0   pregabalin (LYRICA) 25 MG capsule TAKE 1 CAPSULE (25 MG TOTAL) BY MOUTH 2 (TWO) TIMES DAILY. (Patient not taking: Reported on 11/10/2020) 60 capsule 5   No current facility-administered medications for this visit.    No Known Allergies   Past Medical History:  Diagnosis Date   Alcohol use    Anxiety    Hyperlipidemia    Liver disease    Neuropathy  Periodontitis, chronic, generalized    Tobacco use    Vitamin D deficiency 03/2019    Past Surgical History:  Procedure Laterality Date   MULTIPLE EXTRACTIONS WITH ALVEOLOPLASTY Bilateral 09/04/2019   Procedure: MULTIPLE EXTRACTION W/ ALVEOLOPLASTY, Removal of left maxillary buccal exostosis;  Surgeon: Ocie Doyne, DDS;  Location: [facility] OR;  Service: Oral Surgery;  Laterality: Bilateral;   NO PAST SURGERIES      Social History   Socioeconomic History   Marital status: Single    Spouse name: Not on file   Number of children: 4   Years of education: Not on file   Highest education level: Not on file  Occupational History   Not on file  Tobacco Use   Smoking status:  Every Day    Packs/day: 1.00    Types: Cigarettes   Smokeless tobacco: Never  Vaping Use   Vaping Use: Never used  Substance and Sexual Activity   Alcohol use: Yes    Alcohol/week: 40.0 standard drinks    Types: 40 Standard drinks or equivalent per week    Comment: States I drink beer all day long   Drug use: Yes    Types: Marijuana, Cocaine    Comment: Patient stated I smoke marijuana everyday   Sexual activity: Yes    Partners: Female  Other Topics Concern   Not on file  Social History Narrative   Right handed   Lives in a 8 story building    Drinks a pepsi occasionally    Social Determinants of Health   Financial Resource Strain: Not on file  Food Insecurity: Not on file  Transportation Needs: Not on file  Physical Activity: Not on file  Stress: Not on file  Social Connections: Not on file  Intimate Partner Violence: Not on file    Family History  Problem Relation Age of Onset   CAD Father     ROS: Patient complains of weakness in his arms and legs from cervical disc problem but no fevers or chills, productive cough, hemoptysis, dysphasia, odynophagia, melena, hematochezia, dysuria, hematuria, rash, seizure activity, orthopnea, PND, pedal edema, claudication. Remaining systems are negative.  Physical Exam:   Blood pressure 126/70, pulse 85, height 5' 11.5" (1.816 m), weight 134 lb 6.4 oz (61 kg), SpO2 100 %.  General:  Well developed/well nourished in NAD Skin warm/dry Patient not depressed No peripheral clubbing Back-normal HEENT-normal/normal eyelids Neck supple/normal carotid upstroke bilaterally; no bruits; no JVD; no thyromegaly chest - CTA/ normal expansion CV - RRR/normal S1 and S2; no murmurs, rubs or gallops;  PMI nondisplaced Abdomen -NT/ND, no HSM, no mass, + bowel sounds, no bruit 2+ femoral pulses, no bruits Ext-no edema, chords, 2+ DP Neuro-grossly nonfocal  ECG -November 10, 2020-sinus rhythm with PACs and prolonged QT interval.  Personally  reviewed  Today's electrocardiogram shows sinus rhythm with PACs, normal axis, no ST changes, prolonged QT interval, personally reviewed.  A/P  1 preoperative evaluation prior to cervical disc surgery-patient complained of chest pain at time of evaluation with neurosurgery.  Symptoms were atypical.  I will arrange a Lexiscan nuclear study for risk stratification preoperatively.  Note his functional capacity is difficult to assess due to weakness in his legs from cervical disc issue.  2 chest pain-as above symptoms atypical.  Plan Lexiscan nuclear study for risk stratification.  3 hyperlipidemia-continue statin.  4 tobacco abuse-patient counseled on discontinuing.  5 alcohol abuse-patient counseled on discontinuing.  6 history of cocaine use-patient counseled on avoiding.  Olga Millers, MD

## 2020-12-02 ENCOUNTER — Other Ambulatory Visit: Payer: Self-pay

## 2020-12-02 ENCOUNTER — Ambulatory Visit (INDEPENDENT_AMBULATORY_CARE_PROVIDER_SITE_OTHER): Payer: Medicare Other | Admitting: Cardiology

## 2020-12-02 ENCOUNTER — Encounter: Payer: Self-pay | Admitting: Cardiology

## 2020-12-02 ENCOUNTER — Telehealth (HOSPITAL_COMMUNITY): Payer: Self-pay | Admitting: Radiology

## 2020-12-02 VITALS — BP 126/70 | HR 85 | Ht 71.5 in | Wt 134.4 lb

## 2020-12-02 DIAGNOSIS — E7800 Pure hypercholesterolemia, unspecified: Secondary | ICD-10-CM

## 2020-12-02 DIAGNOSIS — Z720 Tobacco use: Secondary | ICD-10-CM | POA: Diagnosis not present

## 2020-12-02 DIAGNOSIS — R072 Precordial pain: Secondary | ICD-10-CM

## 2020-12-02 NOTE — Patient Instructions (Signed)
    Testing/Procedures:  Your physician has requested that you have a lexiscan myoview. For further information please visit [email or web address]. Please follow instruction sheet, as given. [address]  Follow-Up: At [facility], you and your health needs are our priority.  As part of our continuing mission to provide you with exceptional heart care, we have created designated Provider Care Teams.  These Care Teams include your primary Cardiologist (physician) and Advanced Practice Providers (APPs -  Physician Assistants and Nurse Practitioners) who all work together to provide you with the care you need, when you need it.  We recommend signing up for the patient portal called "MyChart".  Sign up information is provided on this After Visit Summary.  MyChart is used to connect with patients for Virtual Visits (Telemedicine).  Patients are able to view lab/test results, encounter notes, upcoming appointments, etc.  Non-urgent messages can be sent to your provider as well.   To learn more about what you can do with MyChart, go to [email or web address].    Your next appointment:    AS NEEDED 

## 2020-12-02 NOTE — Telephone Encounter (Signed)
Patient given detailed instructions per Myocardial Perfusion Study Information Sheet for the test on 12/06/2020 at 7:45. Patient notified to arrive 15 minutes early and that it is imperative to arrive on time for appointment to keep from having the test rescheduled.  If you need to cancel or reschedule your appointment, please call the office within 24 hours of your appointment. . Patient verbalized understanding.EHK

## 2020-12-06 ENCOUNTER — Other Ambulatory Visit: Payer: Self-pay

## 2020-12-06 ENCOUNTER — Ambulatory Visit (HOSPITAL_COMMUNITY): Payer: Medicare Other | Attending: Cardiology

## 2020-12-06 DIAGNOSIS — Z20822 Contact with and (suspected) exposure to covid-19: Secondary | ICD-10-CM | POA: Diagnosis not present

## 2020-12-06 DIAGNOSIS — R072 Precordial pain: Secondary | ICD-10-CM

## 2020-12-06 LAB — MYOCARDIAL PERFUSION IMAGING
LV dias vol: 115 mL (ref 62–150)
LV sys vol: 64 mL
Peak HR: 111 {beats}/min
Rest HR: 80 {beats}/min
SDS: 3
SRS: 1
SSS: 4
TID: 0.98

## 2020-12-06 MED ORDER — REGADENOSON 0.4 MG/5ML IV SOLN
0.4000 mg | Freq: Once | INTRAVENOUS | Status: AC
  Administered 2020-12-06: 0.4 mg via INTRAVENOUS

## 2020-12-06 MED ORDER — TECHNETIUM TC 99M TETROFOSMIN IV KIT
30.8000 | PACK | Freq: Once | INTRAVENOUS | Status: AC | PRN
Start: 2020-12-06 — End: 2020-12-06
  Administered 2020-12-06: 30.8 via INTRAVENOUS
  Filled 2020-12-06: qty 31

## 2020-12-06 MED ORDER — TECHNETIUM TC 99M TETROFOSMIN IV KIT
10.1000 | PACK | Freq: Once | INTRAVENOUS | Status: AC | PRN
Start: 2020-12-06 — End: 2020-12-06
  Administered 2020-12-06: 10.1 via INTRAVENOUS
  Filled 2020-12-06: qty 11

## 2020-12-13 ENCOUNTER — Telehealth: Payer: Self-pay | Admitting: Clinical

## 2020-12-13 DIAGNOSIS — Z20822 Contact with and (suspected) exposure to covid-19: Secondary | ICD-10-CM | POA: Diagnosis not present

## 2020-12-13 NOTE — Telephone Encounter (Signed)
[facility] Case Management Referral Note  12/13/2020 Name: AXLE PARFAIT MRN: [id number] DOB: Sep 16, 1955 DIONNE ROSSA is a 65 y.o. year old male who sees System, Provider Not In for primary care. LCSW was consulted to assess patient's needs and assist the patient with  durable medical equipment .  **This was an unsuccessful encounter.**  Interpreter: No.   Interpreter Name & Language: none  Assessment: Patient experiencing  mobility and pain issues . Provider, Thad Ranger, NP indicated that patient would benefit from a walker and his insurance may not cover it.  Intervention: CSW called patient and LVMs on 7/15, 7/18, and today 12/13/20. Was unable to reach patient and patient did not return calls. CSW available from clinic if patient follows up.   Review of patient status, including review of consultants reports, relevant laboratory and other test results, and collaboration with appropriate care team members and the patient's provider was performed as part of comprehensive patient evaluation and provision of services.    Abigail Butts, LCSW [facility] [phone]

## 2020-12-23 ENCOUNTER — Other Ambulatory Visit (HOSPITAL_COMMUNITY): Payer: Medicare Other

## 2020-12-24 DIAGNOSIS — Z20822 Contact with and (suspected) exposure to covid-19: Secondary | ICD-10-CM | POA: Diagnosis not present

## 2020-12-27 ENCOUNTER — Other Ambulatory Visit: Payer: Self-pay

## 2020-12-27 ENCOUNTER — Ambulatory Visit (HOSPITAL_COMMUNITY): Payer: Medicare Other | Attending: Internal Medicine

## 2020-12-27 DIAGNOSIS — R072 Precordial pain: Secondary | ICD-10-CM | POA: Diagnosis not present

## 2020-12-27 LAB — ECHOCARDIOGRAM COMPLETE
Area-P 1/2: 4.17 cm2
S' Lateral: 3.5 cm

## 2020-12-28 ENCOUNTER — Telehealth: Payer: Self-pay | Admitting: Cardiology

## 2020-12-28 NOTE — Telephone Encounter (Signed)
Patient is calling to discuss echo results. 

## 2020-12-28 NOTE — Telephone Encounter (Signed)
Returned pt's call, relayed the following from Dr. Jens Som:  Lewayne Bunting, MD  12/27/2020  3:16 PM EDT      LV function mildly reduced; please make sure pt has elective fuov Olga Millers   Pt verbalized understanding. Made pt a follow up appointment for 03/07/2021. Will forward to Dr. Jens Som and primary nurse Stanton Kidney for review. All questions/concerns addressed at this time.

## 2020-12-28 NOTE — Telephone Encounter (Signed)
Follow up:     Patient returning a call back. Patient has to turn himself in today.

## 2020-12-28 NOTE — Telephone Encounter (Signed)
Attempted to call patient at both listed numbers. For home phone left patient a nonspecific message for him to return office call. For pt's mobile his mother Leonard Ramos answered (ok per DPR), let pt's mother know the office is attempting to contact her son for results. She reports she will let him know so he can call back.

## 2020-12-30 ENCOUNTER — Other Ambulatory Visit: Payer: Self-pay

## 2021-01-26 ENCOUNTER — Other Ambulatory Visit: Payer: Self-pay

## 2021-01-27 ENCOUNTER — Other Ambulatory Visit: Payer: Self-pay

## 2021-02-01 ENCOUNTER — Telehealth: Payer: Self-pay | Admitting: Cardiology

## 2021-02-01 DIAGNOSIS — Z1152 Encounter for screening for COVID-19: Secondary | ICD-10-CM | POA: Diagnosis not present

## 2021-02-01 NOTE — Telephone Encounter (Signed)
   Name: Leonard Ramos  DOB: Dec 15, 1955  MRN: [id number]   Primary Cardiologist: Olga Millers, MD  Chart reviewed as part of pre-operative protocol coverage.   Pt was seen by Dr. Jens Som on 12/02/20 for preop clearance. He reported chest pain and subsequently underwent nuclear stress test that was nonischemic. Follow up echo showed mild reduction in LVEF. Per Dr. Jens Som: cleared for surgery.   Therefore, based on ACC/AHA guidelines, the patient would be at acceptable risk for the planned procedure without further cardiovascular testing.    I will route this recommendation to the requesting party via Epic fax function and remove from pre-op pool. Please call with questions.  Roe Rutherford Jouri Threat, PA 02/01/2021, 2:36 PM

## 2021-02-01 NOTE — Telephone Encounter (Signed)
Patient is following up on the satus of his clearance (see 11/29/20 encounter). He states the requesting office never received our recommendation. Please advise.

## 2021-02-02 ENCOUNTER — Other Ambulatory Visit: Payer: Self-pay | Admitting: Neurosurgery

## 2021-02-04 ENCOUNTER — Telehealth: Payer: Self-pay

## 2021-02-04 NOTE — Telephone Encounter (Signed)
Called pt to schedule AWV, I do not see one on here. LM with pt to call the office to schedule. LM number to clinic

## 2021-02-09 DIAGNOSIS — R030 Elevated blood-pressure reading, without diagnosis of hypertension: Secondary | ICD-10-CM | POA: Diagnosis not present

## 2021-02-15 NOTE — Progress Notes (Signed)
Surgical Instructions    Your procedure is scheduled on 02/28/21.  Report to [facility] Main Entrance "A" at 7:30 A.M., then check in with the Admitting office.  Call this number if you have problems the morning of surgery:  [phone]   If you have any questions prior to your surgery date call [phone]: Open Monday-Friday 8am-4pm    Remember:  Do not eat or drink after midnight the night before your surgery      Take these medicines the morning of surgery with A SIP OF WATER  busPIRone (BUSPAR)  Carboxymethylcellul-Glycerin (LUBRICATING EYE DROPS OP) FLUoxetine (PROZAC) gabapentin (NEURONTIN) pravastatin (PRAVACHOL)    As of today, STOP taking any Aspirin (unless otherwise instructed by your surgeon) Aleve, Naproxen, Ibuprofen, Motrin, Advil, Goody's, BC's, all herbal medications, fish oil, and all vitamins.          Do not wear jewelry or makeup Do not wear lotions, powders, perfumes/colognes, or deodorant. Men may shave face and neck. Do not bring valuables to the hospital. DO Not wear nail polish, gel polish, artificial nails, or any other type of covering on natural nails including finger and toenails. If patients have artificial nails, gel coating, etc. that need to be removed by a nail salon please have this removed prior to surgery or surgery may need to be canceled/delayed if the surgeon/ anesthesia feels like the patient is unable to be adequately monitored.             [facility] is not responsible for any belongings or valuables.  Do NOT Smoke (Tobacco/Vaping)  24 hours prior to your procedure If you use a CPAP at night, you may bring your mask for your overnight stay.   Contacts, glasses, dentures or bridgework may not be worn into surgery, please bring cases for these belongings   For patients admitted to the hospital, discharge time will be determined by your treatment team.   Patients discharged the day of surgery will not be allowed to drive home, and  someone needs to stay with them for 24 hours.  NO VISITORS WILL BE ALLOWED IN PRE-OP WHERE PATIENTS GET READY FOR SURGERY.  ONLY 1 SUPPORT PERSON MAY BE PRESENT IN THE WAITING ROOM WHILE YOU ARE IN SURGERY.  IF YOU ARE TO BE ADMITTED, ONCE YOU ARE IN YOUR ROOM YOU WILL BE ALLOWED TWO (2) VISITORS.  Minor children may have two parents present. Special consideration for safety and communication needs will be reviewed on a case by case basis.  Special instructions:    Oral Hygiene is also important to reduce your risk of infection.  Remember - BRUSH YOUR TEETH THE MORNING OF SURGERY WITH YOUR REGULAR TOOTHPASTE   [facility]- Preparing For Surgery  Before surgery, you can play an important role. Because skin is not sterile, your skin needs to be as free of germs as possible. You can reduce the number of germs on your skin by washing with CHG (chlorahexidine gluconate) Soap before surgery.  CHG is an antiseptic cleaner which kills germs and bonds with the skin to continue killing germs even after washing.     Please do not use if you have an allergy to CHG or antibacterial soaps. If your skin becomes reddened/irritated stop using the CHG.  Do not shave (including legs and underarms) for at least 48 hours prior to first CHG shower. It is OK to shave your face.  Please follow these instructions carefully.     Shower the [employer] BEFORE SURGERY  and the MORNING OF SURGERY with CHG Soap.   If you chose to wash your hair, wash your hair first as usual with your normal shampoo. After you shampoo, rinse your hair and body thoroughly to remove the shampoo.  Then [employer] and genitals (private parts) with your normal soap and rinse thoroughly to remove soap.  After that Use CHG Soap as you would any other liquid soap. You can apply CHG directly to the skin and wash gently with a scrungie or a clean washcloth.   Apply the CHG Soap to your body ONLY FROM THE NECK DOWN.  Do not use on open wounds or open  sores. Avoid contact with your eyes, ears, mouth and genitals (private parts). Wash Face and genitals (private parts)  with your normal soap.   Wash thoroughly, paying special attention to the area where your surgery will be performed.  Thoroughly rinse your body with warm water from the neck down.  DO NOT shower/wash with your normal soap after using and rinsing off the CHG Soap.  Pat yourself dry with a CLEAN TOWEL.  Wear CLEAN PAJAMAS to bed the night before surgery  Place CLEAN SHEETS on your bed the night before your surgery  DO NOT SLEEP WITH PETS.   Day of Surgery: Take a shower with CHG soap. Wear Clean/Comfortable clothing the morning of surgery Do not apply any deodorants/lotions.   Remember to brush your teeth WITH YOUR REGULAR TOOTHPASTE.   Please read over the following fact sheets that you were given.

## 2021-02-16 ENCOUNTER — Encounter (HOSPITAL_COMMUNITY): Payer: Self-pay

## 2021-02-16 ENCOUNTER — Encounter (HOSPITAL_COMMUNITY)
Admission: RE | Admit: 2021-02-16 | Discharge: 2021-02-16 | Disposition: A | Discharge status: Home / Self Care | Payer: Medicare Other | Source: Ambulatory Visit | Attending: Neurosurgery | Admitting: Neurosurgery

## 2021-02-16 ENCOUNTER — Other Ambulatory Visit: Payer: Self-pay

## 2021-02-16 DIAGNOSIS — Z79899 Other long term (current) drug therapy: Secondary | ICD-10-CM | POA: Insufficient documentation

## 2021-02-16 DIAGNOSIS — Z7982 Long term (current) use of aspirin: Secondary | ICD-10-CM | POA: Diagnosis not present

## 2021-02-16 DIAGNOSIS — E785 Hyperlipidemia, unspecified: Secondary | ICD-10-CM | POA: Diagnosis not present

## 2021-02-16 DIAGNOSIS — F17210 Nicotine dependence, cigarettes, uncomplicated: Secondary | ICD-10-CM | POA: Insufficient documentation

## 2021-02-16 DIAGNOSIS — M4802 Spinal stenosis, cervical region: Secondary | ICD-10-CM | POA: Insufficient documentation

## 2021-02-16 DIAGNOSIS — Z01812 Encounter for preprocedural laboratory examination: Secondary | ICD-10-CM | POA: Diagnosis not present

## 2021-02-16 HISTORY — DX: Unspecified osteoarthritis, unspecified site: M19.90

## 2021-02-16 LAB — BASIC METABOLIC PANEL
Anion gap: 9 (ref 5–15)
BUN: 7 mg/dL — ABNORMAL LOW (ref 8–23)
CO2: 21 mmol/L — ABNORMAL LOW (ref 22–32)
Calcium: 8.7 mg/dL — ABNORMAL LOW (ref 8.9–10.3)
Chloride: 108 mmol/L (ref 98–111)
Creatinine, Ser: 0.74 mg/dL (ref 0.61–1.24)
GFR, Estimated: >60 mL/min (ref >60)
Glucose, Bld: 136 mg/dL — ABNORMAL HIGH (ref 70–99)
Potassium: 3.7 mmol/L (ref 3.5–5.1)
Sodium: 138 mmol/L (ref 135–145)

## 2021-02-16 LAB — CBC WITH DIFFERENTIAL/PLATELET
Abs Immature Granulocytes: 0.02 10*3/uL (ref 0.00–0.07)
Basophils Absolute: 0 10*3/uL (ref 0.0–0.1)
Basophils Relative: 1 %
Eosinophils Absolute: 0.1 10*3/uL (ref 0.0–0.5)
Eosinophils Relative: 2 %
HCT: 45.1 % (ref 39.0–52.0)
Hemoglobin: 15.6 g/dL (ref 13.0–17.0)
Immature Granulocytes: 0 %
Lymphocytes Relative: 27 %
Lymphs Abs: 1.4 10*3/uL (ref 0.7–4.0)
MCH: 32.3 pg (ref 26.0–34.0)
MCHC: 34.6 g/dL (ref 30.0–36.0)
MCV: 93.4 fL (ref 80.0–100.0)
Monocytes Absolute: 0.7 10*3/uL (ref 0.1–1.0)
Monocytes Relative: 14 %
Neutro Abs: 2.8 10*3/uL (ref 1.7–7.7)
Neutrophils Relative %: 56 %
Platelets: 246 10*3/uL (ref 150–400)
RBC: 4.83 MIL/uL (ref 4.22–5.81)
RDW: 14.6 % (ref 11.5–15.5)
WBC: 5 10*3/uL (ref 4.0–10.5)
nRBC: 0 % (ref 0.0–0.2)

## 2021-02-16 LAB — TYPE AND SCREEN
ABO/RH(D): O POS
Antibody Screen: NEGATIVE

## 2021-02-16 LAB — SURGICAL PCR SCREEN
MRSA, PCR: NEGATIVE
Staphylococcus aureus: NEGATIVE

## 2021-02-16 NOTE — Progress Notes (Addendum)
PCP - [facility], [facility] Cardiologist - Olga Millers MD  PPM/ICD - denies Device Orders -  Rep Notified -   Chest x-ray - 08/19/20 EKG - 12/02/20 Stress Test - 12/06/20 ECHO - 12/27/20 Cardiac Cath - none  Sleep Study - no CPAP - no  Fasting Blood Sugar - n/a Checks Blood Sugar _____ times a day  Blood Thinner Instructions:n/a Aspirin Instructions:Pt states he will call Dr. Lindalou Hose office for instructions.  ERAS Protcol -no PRE-SURGERY Ensure or G2-   COVID TEST- scheduled for  October 7 at 1300.   Anesthesia review: yes, cardiac history, substance abuse history  Patient denies shortness of breath, fever, cough and chest pain at PAT appointment   All instructions explained to the patient, with a verbal understanding of the material. Patient agrees to go over the instructions while at home for a better understanding. Patient also instructed to self quarantine after being tested for COVID-19. The opportunity to ask questions was provided.  [employer] and

## 2021-02-17 ENCOUNTER — Other Ambulatory Visit: Payer: Self-pay

## 2021-02-17 LAB — HEPATIC FUNCTION PANEL
ALT: 30 U/L (ref 0–44)
AST: 50 U/L — ABNORMAL HIGH (ref 15–41)
Albumin: 3.5 g/dL (ref 3.5–5.0)
Alkaline Phosphatase: 69 U/L (ref 38–126)
Bilirubin, Direct: 0.1 mg/dL (ref 0.0–0.2)
Indirect Bilirubin: 0.3 mg/dL (ref 0.3–0.9)
Total Bilirubin: 0.4 mg/dL (ref 0.3–1.2)
Total Protein: 6.1 g/dL — ABNORMAL LOW (ref 6.5–8.1)

## 2021-02-17 NOTE — Anesthesia Preprocedure Evaluation (Addendum)
Anesthesia Evaluation  Patient identified by MRN, date of birth, ID band Patient awake    Reviewed: Allergy & Precautions, NPO status , Patient's Chart, lab work & pertinent test results  History of Anesthesia Complications Negative for: history of anesthetic complications  Airway Mallampati: I  TM Distance: >3 FB Neck ROM: Full    Dental  (+) Edentulous Upper, Edentulous Lower   Pulmonary Current Smoker and Patient abstained from smoking.,    Pulmonary exam normal        Cardiovascular +CHF  Normal cardiovascular exam  Echo 12/27/20: IMPRESSIONS  1. Left ventricular ejection fraction, by estimation, is 45 to 50%. The  left ventricle has mildly decreased function. The left ventricle  demonstrates regional wall motion abnormalities (see scoring  diagram/findings for description). There is mild left  ventricular hypertrophy. Left ventricular diastolic parameters are  consistent with Grade I diastolic dysfunction (impaired relaxation). There  is moderate hypokinesis of the left ventricular, basal-mid inferior wall  and inferolateral wall.  2. Right ventricular systolic function is hyperdynamic. The right  ventricular size is normal.  3. The mitral valve is abnormal. Trivial mitral valve regurgitation.  4. The aortic valve is tricuspid. Aortic valve regurgitation is not  visualized.  5. The inferior vena cava is normal in size with greater than 50%  respiratory variability, suggesting right atrial pressure of 3 mmHg.  Comparison(s): No prior Echocardiogram.    Nuclear stress test 12/06/20: . The left ventricular ejection fraction is moderately decreased (30-44%). . Nuclear stress EF: 44%. . There was no ST segment deviation noted during stress. . Defect 1: There is a small defect of moderate severity present in the apical inferior and apex location. . This is a low risk study.  Low risk, mildly abnormal stress nuclear  study with inferior apical thinning but no ischemia. Gated ejection fraction 44% with mild global hypokinesis. Mild left ventricular enlargement. Suggest echocardiogram to better assess LV function.    Neuro/Psych Anxiety Depression    GI/Hepatic negative GI ROS, (+)     substance abuse  alcohol use, cocaine use and marijuana use,   Endo/Other  negative endocrine ROS  Renal/GU negative Renal ROS  negative genitourinary   Musculoskeletal  (+) Arthritis ,   Abdominal   Peds  Hematology negative hematology ROS (+)   Anesthesia Other Findings   Reproductive/Obstetrics                           Anesthesia Physical Anesthesia Plan  ASA: 3  Anesthesia Plan: General   Post-op Pain Management:    Induction: Intravenous  PONV Risk Score and Plan: 1 and Ondansetron, Dexamethasone, Treatment may vary due to age or medical condition and Midazolam  Airway Management Planned: Oral ETT and Video Laryngoscope Planned  Additional Equipment: None  Intra-op Plan:   Post-operative Plan: Extubation in OR  Informed Consent: I have reviewed the patients History and Physical, chart, labs and discussed the procedure including the risks, benefits and alternatives for the proposed anesthesia with the patient or authorized representative who has indicated his/her understanding and acceptance.     Dental advisory given  Plan Discussed with:   Anesthesia Plan Comments: (See PAT note written 02/17/2021 by Shonna Chock, PA-C. History of polysubstance abuse. Prolonged QT on EKG. Has cardiology preoperative evaluation by Dr. Jens Som. )       Anesthesia Quick Evaluation

## 2021-02-17 NOTE — Progress Notes (Signed)
Anesthesia Chart Review:  Case: [id number] Date/Time: 02/28/21 0915   Procedure: ACDF - C3-C4 - C4-C5 - C5-C6   Anesthesia type: General   Pre-op diagnosis: Stenosis   Location: [facility] ROOM 18 / [facility]   Surgeons: Julio Sicks, MD       DISCUSSION: Patient is a 65 year old male scheduled for the above procedure. Surgery was initially planned for 11/14/20, but I evaluated him at his PAT visit then because he reported recurrent chest pains in setting of polysubstance abuse including cocaine. At that time we discussed refraining from cocaine, particularly prior to surgery. He was not interested in stopping alcohol, THC, or cigarettes at that time. He has since obtained cardiology clearance for surgery (see below).  History includes smoking, polysubstance abuse (cocaine, alcohol, tobacco, marijuana), HLD, liver disease (hepatic steatosis by 2020 [place]), anxiety, cervical myelopathy/neuropathy. Reports full dentures. As of 11/10/20, he reported drinking beers "all day" (totaling two 6 packs/day) and smoked marijuana daily. Denied DT history. He lives alone and has a sister locally that helps him with medications.     Preoperative cardiology input outline on 02/01/21 by Micah Flesher, PA, "Pt was seen by Dr. Jens Som on 12/02/20 for preop clearance. He reported chest pain and subsequently underwent nuclear stress test that was nonischemic. Follow up echo showed mild reduction in LVEF. Per Dr. Jens Som: cleared for surgery.    Therefore, based on ACC/AHA guidelines, the patient would be at acceptable risk for the planned procedure without further cardiovascular testing..."  I notified Erie Noe at Dr. Lindalou Hose office that patient needed perioperative ASA instructions.   Preoperative COVID-19 testing is scheduled for 02/24/21. Anesthesia team to evaluate on the day of surgery.    VS: BP (!) 133/93   Pulse 97   Temp 36.7 C   Resp 18   Ht 5' 11.5" (1.816 m)   Wt 63 kg   SpO2 100%   BMI 19.09 kg/m    PROVIDERS: Raliegh Scarlet, FNP is PCP [facility]) Nita Sickle, DO is neurologist Olga Millers, MD is cardiologist   LABS: Labs reviewed: Acceptable for surgery. HFP added due to polysubstance/alcohol abuse. Mildly elevated AST at 50, stable when compared to previous labs.  (all labs ordered are listed, but only abnormal results are displayed)  Labs Reviewed  BASIC METABOLIC PANEL - Abnormal; Notable for the following components:      Result Value   CO2 21 (*)    Glucose, Bld 136 (*)    BUN 7 (*)    Calcium 8.7 (*)    All other components within normal limits  SURGICAL PCR SCREEN  CBC WITH DIFFERENTIAL/PLATELET  TYPE AND SCREEN  AST 50, ALT 30, total bilirubin 0.4.    IMAGES: MRI C-spine 09/27/20: IMPRESSION: - Chronic degenerative spondylosis from C3-4 through C5-6 with canal narrowing, most pronounced at C4-5 where the AP diameter is only 6.8 mm. Abnormal T2 signal in the cord throughout the region consistent with compressive myelopathy. - Facet osteoarthritis on the left at C2-3 with edema which could be painful. Foraminal encroachment on the left could compress the left C3 nerve. - Bilateral foraminal stenosis C3-4, C4-5 and C5-6 that could affect the exiting nerves on either or both sides at those levels. - Lesser foraminal narrowing at C6-7, potentially symptomatic. - Severe bilateral foraminal stenosis at C7-T1 likely to affect either or both C8 nerves. - Discogenic edema within the vertebrae at C3, C4, C5, C7 and T1, likely contributing to regional neck  pain.   MRI L-spine 09/10/20: IMPRESSION: 1. Advanced lumbar disc and facet degeneration with progressive severe spinal stenosis at L2-3. 2. Unchanged severe spinal stenosis at L4-5. 3. Mild improvement of severe spinal stenosis at L3-4 due to decreased epidural fat. 4. Severe neural foraminal stenosis on the right at L2-3 and L3-4 and on the left at L5-S1. 5.  Moderate to severe bilateral neural foraminal stenosis at L4-5.   1V PCXR 08/19/20: FINDINGS: The heart size and mediastinal contours are within normal limits. Both lungs are clear. No pneumothorax or pleural effusion is noted. The visualized skeletal structures are unremarkable. IMPRESSION: No active disease.   CT Head 01/07/20: IMPRESSION: 1. No acute intracranial pathology. 2. No significant interval change in the appearance of the cystic sellar/suprasellar mass compared to the CT of 2016.     EKG: EKG 12/02/20: Sinus rhythm with Premature atrial complexes Prolonged QT [QT 410, QTc 487 ms] Abnormal ECG  EKG 11/10/20: Sinus rhythm with Premature atrial complexes Prolonged QT [QT 384, QTc 482 ms] Abnormal ECG   EKG 08/19/20: Sinus rhythm Prolonged QT interval [QT 460, QTc 528 ms] Confirmed by Benjiman Core [phone]) on 08/19/2020 3:20:48 PM   CV: Echo 12/27/20: IMPRESSIONS   1. Left ventricular ejection fraction, by estimation, is 45 to 50%. The  left ventricle has mildly decreased function. The left ventricle  demonstrates regional wall motion abnormalities (see scoring  diagram/findings for description). There is mild left  ventricular hypertrophy. Left ventricular diastolic parameters are  consistent with Grade I diastolic dysfunction (impaired relaxation). There  is moderate hypokinesis of the left ventricular, basal-mid inferior wall  and inferolateral wall.   2. Right ventricular systolic function is hyperdynamic. The right  ventricular size is normal.   3. The mitral valve is abnormal. Trivial mitral valve regurgitation.   4. The aortic valve is tricuspid. Aortic valve regurgitation is not  visualized.   5. The inferior vena cava is normal in size with greater than 50%  respiratory variability, suggesting right atrial pressure of 3 mmHg.  Comparison(s): No prior Echocardiogram.    Nuclear stress test 12/06/20: The left ventricular ejection fraction is moderately  decreased (30-44%). Nuclear stress EF: 44%. There was no ST segment deviation noted during stress. Defect 1: There is a small defect of moderate severity present in the apical inferior and apex location. This is a low risk study.   Low risk, mildly abnormal stress nuclear study with inferior apical thinning but no ischemia.  Gated ejection fraction 44% with mild global hypokinesis.  Mild left ventricular enlargement.  Suggest echocardiogram to better assess LV function.    Past Medical History:  Diagnosis Date   Alcohol use    Anxiety    Arthritis    Hyperlipidemia    Liver disease    Neuropathy    Periodontitis, chronic, generalized    Tobacco use    Vitamin D deficiency 03/2019    Past Surgical History:  Procedure Laterality Date   MULTIPLE EXTRACTIONS WITH ALVEOLOPLASTY Bilateral 09/04/2019   Procedure: MULTIPLE EXTRACTION W/ ALVEOLOPLASTY, Removal of left maxillary buccal exostosis;  Surgeon: Ocie Doyne, DDS;  Location: [facility] OR;  Service: Oral Surgery;  Laterality: Bilateral;   NO PAST SURGERIES      MEDICATIONS:  aspirin 81 MG chewable tablet   busPIRone (BUSPAR) 10 MG tablet   Carboxymethylcellul-Glycerin (LUBRICATING EYE DROPS OP)   chlorhexidine (PERIDEX) 0.12 % solution   diazepam (VALIUM) 5 MG tablet   FLUoxetine (PROZAC) 40 MG capsule   folic acid (  FOLVITE) 1 MG tablet   gabapentin (NEURONTIN) 600 MG tablet   lidocaine (LIDODERM) 5 %   mirtazapine (REMERON SOL-TAB) 30 MG disintegrating tablet   naproxen (NAPROSYN) 500 MG tablet   pantoprazole (PROTONIX) 20 MG tablet   pravastatin (PRAVACHOL) 20 MG tablet   pregabalin (LYRICA) 25 MG capsule   sildenafil (VIAGRA) 100 MG tablet   sucralfate (CARAFATE) 1 GM/10ML suspension   thiamine (VITAMIN B-1) 100 MG tablet   Vitamin D, Ergocalciferol, (DRISDOL) 1.25 MG (50000 UNIT) CAPS capsule   No current facility-administered medications for this encounter.    Shonna Chock, PA-C Surgical Short  Stay/Anesthesiology [facility] Phone [phone] [facility] Phone [phone] 02/17/2021 10:59 AM

## 2021-02-24 ENCOUNTER — Other Ambulatory Visit (HOSPITAL_COMMUNITY)
Admission: RE | Admit: 2021-02-24 | Discharge: 2021-02-24 | Disposition: A | Discharge status: Home / Self Care | Payer: Medicare Other | Source: Ambulatory Visit | Attending: Neurosurgery | Admitting: Neurosurgery

## 2021-02-24 DIAGNOSIS — Z01812 Encounter for preprocedural laboratory examination: Secondary | ICD-10-CM | POA: Insufficient documentation

## 2021-02-24 DIAGNOSIS — Z20822 Contact with and (suspected) exposure to covid-19: Secondary | ICD-10-CM | POA: Insufficient documentation

## 2021-02-24 LAB — SARS CORONAVIRUS 2 (TAT 6-24 HRS): SARS Coronavirus 2: NEGATIVE

## 2021-02-25 ENCOUNTER — Other Ambulatory Visit: Payer: Self-pay

## 2021-02-25 ENCOUNTER — Emergency Department (HOSPITAL_COMMUNITY)
Admission: EM | Admit: 2021-02-25 | Discharge: 2021-02-25 | Disposition: A | Discharge status: Home / Self Care | Payer: Medicare Other | Source: Home / Self Care | Attending: Emergency Medicine | Admitting: Emergency Medicine

## 2021-02-25 ENCOUNTER — Encounter (HOSPITAL_COMMUNITY): Payer: Self-pay | Admitting: Emergency Medicine

## 2021-02-25 ENCOUNTER — Emergency Department (HOSPITAL_COMMUNITY): Payer: Medicare Other

## 2021-02-25 DIAGNOSIS — R112 Nausea with vomiting, unspecified: Secondary | ICD-10-CM | POA: Diagnosis not present

## 2021-02-25 DIAGNOSIS — Z7982 Long term (current) use of aspirin: Secondary | ICD-10-CM | POA: Insufficient documentation

## 2021-02-25 DIAGNOSIS — G629 Polyneuropathy, unspecified: Secondary | ICD-10-CM | POA: Diagnosis not present

## 2021-02-25 DIAGNOSIS — M1990 Unspecified osteoarthritis, unspecified site: Secondary | ICD-10-CM | POA: Diagnosis not present

## 2021-02-25 DIAGNOSIS — K05329 Chronic periodontitis, generalized, unspecified severity: Secondary | ICD-10-CM | POA: Diagnosis not present

## 2021-02-25 DIAGNOSIS — R0789 Other chest pain: Secondary | ICD-10-CM | POA: Insufficient documentation

## 2021-02-25 DIAGNOSIS — M4802 Spinal stenosis, cervical region: Secondary | ICD-10-CM | POA: Diagnosis not present

## 2021-02-25 DIAGNOSIS — K769 Liver disease, unspecified: Secondary | ICD-10-CM | POA: Diagnosis not present

## 2021-02-25 DIAGNOSIS — M4712 Other spondylosis with myelopathy, cervical region: Secondary | ICD-10-CM | POA: Diagnosis not present

## 2021-02-25 DIAGNOSIS — M2578 Osteophyte, vertebrae: Secondary | ICD-10-CM | POA: Diagnosis not present

## 2021-02-25 DIAGNOSIS — M5412 Radiculopathy, cervical region: Secondary | ICD-10-CM | POA: Diagnosis not present

## 2021-02-25 DIAGNOSIS — R079 Chest pain, unspecified: Secondary | ICD-10-CM | POA: Diagnosis not present

## 2021-02-25 DIAGNOSIS — R6889 Other general symptoms and signs: Secondary | ICD-10-CM | POA: Diagnosis not present

## 2021-02-25 DIAGNOSIS — F17210 Nicotine dependence, cigarettes, uncomplicated: Secondary | ICD-10-CM | POA: Insufficient documentation

## 2021-02-25 DIAGNOSIS — G992 Myelopathy in diseases classified elsewhere: Secondary | ICD-10-CM | POA: Diagnosis not present

## 2021-02-25 DIAGNOSIS — Z79899 Other long term (current) drug therapy: Secondary | ICD-10-CM | POA: Diagnosis not present

## 2021-02-25 DIAGNOSIS — Z743 Need for continuous supervision: Secondary | ICD-10-CM | POA: Diagnosis not present

## 2021-02-25 DIAGNOSIS — R000 Tachycardia, unspecified: Secondary | ICD-10-CM | POA: Insufficient documentation

## 2021-02-25 DIAGNOSIS — E559 Vitamin D deficiency, unspecified: Secondary | ICD-10-CM | POA: Diagnosis not present

## 2021-02-25 DIAGNOSIS — E785 Hyperlipidemia, unspecified: Secondary | ICD-10-CM | POA: Diagnosis not present

## 2021-02-25 DIAGNOSIS — J439 Emphysema, unspecified: Secondary | ICD-10-CM | POA: Diagnosis not present

## 2021-02-25 LAB — COMPREHENSIVE METABOLIC PANEL
ALT: 28 U/L (ref 0–44)
AST: 40 U/L (ref 15–41)
Albumin: 3.8 g/dL (ref 3.5–5.0)
Alkaline Phosphatase: 78 U/L (ref 38–126)
Anion gap: 10 (ref 5–15)
BUN: <5 mg/dL — ABNORMAL LOW (ref 8–23)
CO2: 26 mmol/L (ref 22–32)
Calcium: 8.9 mg/dL (ref 8.9–10.3)
Chloride: 101 mmol/L (ref 98–111)
Creatinine, Ser: 0.78 mg/dL (ref 0.61–1.24)
GFR, Estimated: >60 mL/min (ref >60)
Glucose, Bld: 89 mg/dL (ref 70–99)
Potassium: 3.8 mmol/L (ref 3.5–5.1)
Sodium: 137 mmol/L (ref 135–145)
Total Bilirubin: 1.1 mg/dL (ref 0.3–1.2)
Total Protein: 6.7 g/dL (ref 6.5–8.1)

## 2021-02-25 LAB — CBC WITH DIFFERENTIAL/PLATELET
Abs Immature Granulocytes: 0.01 10*3/uL (ref 0.00–0.07)
Basophils Absolute: 0 10*3/uL (ref 0.0–0.1)
Basophils Relative: 1 %
Eosinophils Absolute: 0.1 10*3/uL (ref 0.0–0.5)
Eosinophils Relative: 2 %
HCT: 48.7 % (ref 39.0–52.0)
Hemoglobin: 16.3 g/dL (ref 13.0–17.0)
Immature Granulocytes: 0 %
Lymphocytes Relative: 35 %
Lymphs Abs: 1.6 10*3/uL (ref 0.7–4.0)
MCH: 31.8 pg (ref 26.0–34.0)
MCHC: 33.5 g/dL (ref 30.0–36.0)
MCV: 94.9 fL (ref 80.0–100.0)
Monocytes Absolute: 0.6 10*3/uL (ref 0.1–1.0)
Monocytes Relative: 12 %
Neutro Abs: 2.4 10*3/uL (ref 1.7–7.7)
Neutrophils Relative %: 50 %
Platelets: 244 10*3/uL (ref 150–400)
RBC: 5.13 MIL/uL (ref 4.22–5.81)
RDW: 14.4 % (ref 11.5–15.5)
WBC: 4.7 10*3/uL (ref 4.0–10.5)
nRBC: 0 % (ref 0.0–0.2)

## 2021-02-25 LAB — TROPONIN I (HIGH SENSITIVITY)
Troponin I (High Sensitivity): 9 ng/L (ref <18)
Troponin I (High Sensitivity): 8 ng/L (ref <18)

## 2021-02-25 LAB — LIPASE, BLOOD: Lipase: 33 U/L (ref 11–51)

## 2021-02-25 MED ORDER — OMEPRAZOLE 20 MG PO CPDR: 20.0000 mg | DELAYED_RELEASE_CAPSULE | Freq: Every day | ORAL | 0 refills | Status: DC

## 2021-02-25 MED ORDER — ONDANSETRON 4 MG PO TBDP: 4.0000 mg | ORAL_TABLET | Freq: Three times a day (TID) | ORAL | 0 refills | Status: DC | PRN

## 2021-02-25 MED ORDER — ONDANSETRON HCL 4 MG/2ML IJ SOLN
4.0000 mg | Freq: Once | INTRAMUSCULAR | Status: AC
  Administered 2021-02-25: 4 mg via INTRAVENOUS
  Filled 2021-02-25: qty 2

## 2021-02-25 MED ORDER — SODIUM CHLORIDE 0.9 % IV BOLUS
1000.0000 mL | Freq: Once | INTRAVENOUS | Status: AC
  Administered 2021-02-25: 1000 mL via INTRAVENOUS

## 2021-02-25 MED ORDER — ALUM & MAG HYDROXIDE-SIMETH 200-200-20 MG/5ML PO SUSP
30.0000 mL | Freq: Once | ORAL | Status: AC
  Administered 2021-02-25: 30 mL via ORAL
  Filled 2021-02-25: qty 30

## 2021-02-25 NOTE — ED Notes (Signed)
Pt A&Ox4 ambulatory at d/c with independent steady gait but was wheeled out of ED via wheelchair.

## 2021-02-25 NOTE — ED Triage Notes (Signed)
Pt to triage via [facility] from home.  Reports pain to center of chest x 2 hour with nausea and vomiting at onset of pain.  Denies nausea and vomiting at present.  Denies SOB.  20g LAC.  ASA 324mg  given by EMS.

## 2021-02-25 NOTE — ED Provider Notes (Signed)
Emergency Medicine Provider Triage Evaluation Note  Leonard Ramos , a 65 y.o. male  was evaluated in triage.  Pt complains of CP 2 began this morning. No SOB. NBNB Emesis prior to pain onset. No LE edema, DOE.  No history of blood clots.  He was seen by cardiology a few months ago for chest pain. Had lexiscan, he does not know the results.  Review of Systems  Positive: CP, emesis Negative: Headache, weakness, abdominal pain, back pain, lower extremity edema  Physical Exam  There were no vitals taken for this visit. Gen:   Awake, no distress   Resp:  Normal effort  MSK:   Moves extremities without difficulty, compartments soft. Other:    Medical Decision Making  Medically screening exam initiated at 5:48 PM.  Appropriate orders placed.  [name] was informed that the remainder of the evaluation will be completed by another provider, this initial triage assessment does not replace that evaluation, and the importance of remaining in the ED until their evaluation is complete.  CP,  emesis   Atlee Villers A, PA-C 02/25/21 1748    Mancel Bale, MD 02/26/21 [phone]

## 2021-02-25 NOTE — ED Provider Notes (Signed)
[facility] EMERGENCY DEPARTMENT Provider Note   CSN: [id number] Arrival date & time: 02/25/21  1744     History Chief Complaint  Patient presents with   Chest Pain    Leonard Ramos is a 65 y.o. male.  Pt presents to the ED today with cp.  Pt said he had cp after some n/v.  Pt said his sx lasted all day, but are gone now.  EMS did give him asa en route.       Past Medical History:  Diagnosis Date   Alcohol use    Anxiety    Arthritis    Hyperlipidemia    Liver disease    Neuropathy    Periodontitis, chronic, generalized    Tobacco use    Vitamin D deficiency 03/2019    Patient Active Problem List   Diagnosis Date Noted   Tobacco use 07/12/2019   History of hyperglycemia 07/12/2019   Erectile dysfunction 07/12/2019   Chronic left-sided low back pain without sciatica 04/10/2019   Alcohol use 04/10/2019   Poor dentition 04/10/2019   Chronic dental pain 04/10/2019   Urinary frequency 04/10/2019   Aortic atherosclerosis (HCC) 03/19/2017   Generalized anxiety disorder 10/07/2015   Depression 10/07/2015   Neuropathy 09/06/2015   Loss of weight 09/06/2015   Tobacco dependence 09/06/2015   Bowel incontinence 03/09/2015   ETOH abuse 03/09/2015   Left sided numbness 03/08/2015   Left-sided weakness 03/08/2015   Absence of bladder continence 03/08/2015    Past Surgical History:  Procedure Laterality Date   MULTIPLE EXTRACTIONS WITH ALVEOLOPLASTY Bilateral 09/04/2019   Procedure: MULTIPLE EXTRACTION W/ ALVEOLOPLASTY, Removal of left maxillary buccal exostosis;  Surgeon: Ocie Doyne, DDS;  Location: [facility] OR;  Service: Oral Surgery;  Laterality: Bilateral;   NO PAST SURGERIES         Family History  Problem Relation Age of Onset   CAD Father     Social History   Tobacco Use   Smoking status: Every Day    Packs/day: 1.00    Types: Cigarettes   Smokeless tobacco: Never  Vaping Use   Vaping Use: Never used  Substance Use Topics    Alcohol use: Yes    Alcohol/week: 40.0 standard drinks    Types: 40 Standard drinks or equivalent per week    Comment: States I drink beer all day long   Drug use: Yes    Types: Marijuana, Cocaine    Comment: Patient stated I smoke marijuana everyday    Home Medications Prior to Admission medications   Medication Sig Start Date End Date Taking? Authorizing Provider  omeprazole (PRILOSEC) 20 MG capsule Take 1 capsule (20 mg total) by mouth daily. 02/25/21  Yes Jacalyn Lefevre, MD  ondansetron (ZOFRAN ODT) 4 MG disintegrating tablet Take 1 tablet (4 mg total) by mouth every 8 (eight) hours as needed for nausea or vomiting. 02/25/21  Yes Jacalyn Lefevre, MD  aspirin 81 MG chewable tablet Chew 81 mg by mouth daily.    [provider]  busPIRone (BUSPAR) 10 MG tablet TAKE 1 TABLET (10 MG TOTAL) BY MOUTH 2 (TWO) TIMES DAILY. 04/18/20 04/18/21  Kallie Locks, FNP  Carboxymethylcellul-Glycerin (LUBRICATING EYE DROPS OP) Place 1 drop into both eyes daily.    [provider]  chlorhexidine (PERIDEX) 0.12 % solution Use as directed 15 mLs in the mouth or throat daily. 11/30/20   Barbette Merino, NP  diazepam (VALIUM) 5 MG tablet Take 1 tablet 30-min prior to MRI.  Do not drive while taking this medication. Patient not taking: Reported on 12/02/2020 08/31/20   Kallie Locks, FNP  FLUoxetine (PROZAC) 40 MG capsule Take 1 capsule (40 mg total) by mouth every other day for 7 days, THEN 1 capsule (40 mg total) every 36 (thirty-six) hours for 7 days, THEN 1 capsule (40 mg total) once a week for 7 days. 11/30/20 12/22/20  Barbette Merino, NP  folic acid (FOLVITE) 1 MG tablet Take 1 tablet (1 mg total) by mouth daily. 04/18/20   Kallie Locks, FNP  gabapentin (NEURONTIN) 600 MG tablet Take 1 tablet (600 mg total) by mouth 3 (three) times daily. 11/30/20 11/30/21  Barbette Merino, NP  lidocaine (LIDODERM) 5 % Place 1 patch onto the skin daily. Remove & Discard patch within 12 hours or as  directed by MD 01/08/20   Caccavale, Sophia, PA-C  mirtazapine (REMERON SOL-TAB) 30 MG disintegrating tablet Take 1 tablet (30 mg total) by mouth at bedtime. 11/30/20 11/30/21  Barbette Merino, NP  naproxen (NAPROSYN) 500 MG tablet Take 1 tablet (500 mg total) by mouth 2 (two) times daily. 10/19/20   Ivette Loyal, NP  pantoprazole (PROTONIX) 20 MG tablet TAKE 1 TABLET (20 MG TOTAL) BY MOUTH DAILY FOR 14 DAYS. Patient not taking: Reported on 12/02/2020 08/19/20 08/19/21  Couture, Cortni S, PA-C  pravastatin (PRAVACHOL) 20 MG tablet TAKE 1 TABLET (20 MG TOTAL) BY MOUTH DAILY. 04/18/20 04/18/21  Kallie Locks, FNP  pregabalin (LYRICA) 25 MG capsule TAKE 1 CAPSULE (25 MG TOTAL) BY MOUTH 2 (TWO) TIMES DAILY. Patient not taking: Reported on 11/10/2020 04/18/20 10/17/20  Kallie Locks, FNP  sildenafil (VIAGRA) 100 MG tablet Take 1 tablet (100 mg total) by mouth daily as needed for erectile dysfunction. 04/18/20   Kallie Locks, FNP  sucralfate (CARAFATE) 1 GM/10ML suspension TAKE 10 MLS (1 G TOTAL) BY MOUTH 4 (FOUR) TIMES DAILY - WITH MEALS AND AT BEDTIME. 08/19/20 08/19/21  Couture, Cortni S, PA-C  thiamine (VITAMIN B-1) 100 MG tablet Take 1 tablet (100 mg total) by mouth daily. 04/29/19   Kallie Locks, FNP  Vitamin D, Ergocalciferol, (DRISDOL) 1.25 MG (50000 UNIT) CAPS capsule Take 1 capsule (50,000 Units total) by mouth every 7 (seven) days. 04/25/20   Kallie Locks, FNP    Allergies    Patient has no known allergies.  Review of Systems   Review of Systems  Cardiovascular:  Positive for chest pain.  Gastrointestinal:  Positive for nausea and vomiting.  All other systems reviewed and are negative.  Physical Exam Updated Vital Signs BP 137/90   Pulse 83   Temp 98.6 F (37 C)   Resp 16   SpO2 98%   Physical Exam Vitals and nursing note reviewed.  Constitutional:      Appearance: He is well-developed.  HENT:     Head: Normocephalic and atraumatic.  Eyes:     Extraocular  Movements: Extraocular movements intact.     Pupils: Pupils are equal, round, and reactive to light.  Cardiovascular:     Rate and Rhythm: Regular rhythm. Tachycardia present.  Pulmonary:     Effort: Pulmonary effort is normal.  Abdominal:     General: Bowel sounds are normal.     Palpations: Abdomen is soft.  Musculoskeletal:        General: Normal range of motion.     Cervical back: Normal range of motion and neck supple.  Skin:    General: Skin is  warm.     Capillary Refill: Capillary refill takes less than 2 seconds.  Neurological:     General: No focal deficit present.     Mental Status: He is alert and oriented to person, place, and time.  Psychiatric:        Mood and Affect: Mood normal.        Behavior: Behavior normal.    ED Results / Procedures / Treatments   Labs (all labs ordered are listed, but only abnormal results are displayed) Labs Reviewed  COMPREHENSIVE METABOLIC PANEL - Abnormal; Notable for the following components:      Result Value   BUN <5 (*)    All other components within normal limits  CBC WITH DIFFERENTIAL/PLATELET  LIPASE, BLOOD  TROPONIN I (HIGH SENSITIVITY)  TROPONIN I (HIGH SENSITIVITY)    EKG EKG Interpretation  Date/Time:  Saturday February 25 2021 17:51:53 EDT Ventricular Rate:  87 PR Interval:  154 QRS Duration: 86 QT Interval:  406 QTC Calculation: 488 R Axis:   24 Text Interpretation: Sinus rhythm with Premature atrial complexes Prolonged QT Abnormal ECG No significant change since last tracing Confirmed by Jacalyn Lefevre [phone]) on 02/25/2021 9:23:12 PM  Radiology DG Chest 2 View  Result Date: 02/25/2021 CLINICAL DATA:  Central chest pain for 2 hours, nausea and vomiting EXAM: CHEST - 2 VIEW COMPARISON:  08/19/2020 FINDINGS: Frontal and lateral views of the chest demonstrate a stable cardiac silhouette. Lungs are hyperinflated with background emphysema again noted. No airspace disease, effusion, or pneumothorax. No acute bony  abnormalities. IMPRESSION: 1. No acute intrathoracic process. Electronically Signed   By: Sharlet Salina M.D.   On: 02/25/2021 18:59    Procedures Procedures   Medications Ordered in ED Medications  sodium chloride 0.9 % bolus 1,000 mL (1,000 mLs Intravenous New Bag/Given 02/25/21 2203)  ondansetron (ZOFRAN) injection 4 mg (4 mg Intravenous Given 02/25/21 2203)  alum & mag hydroxide-simeth (MAALOX/MYLANTA) 200-200-20 MG/5ML suspension 30 mL (30 mLs Oral Given 02/25/21 2204)    ED Course  I have reviewed the triage vital signs and the nursing notes.  Pertinent labs & imaging results that were available during my care of the patient were reviewed by me and considered in my medical decision making (see chart for details).    MDM Rules/Calculators/A&P                           Cardiac work up negative.  CP is likely from GI source because of the vomiting.  HR is better after fluids.  Pt is instructed to return if worse. F/u with pcp.  Final Clinical Impression(s) / ED Diagnoses Final diagnoses:  Atypical chest pain  Nausea and vomiting, unspecified vomiting type    Rx / DC Orders ED Discharge Orders          Ordered    ondansetron (ZOFRAN ODT) 4 MG disintegrating tablet  Every 8 hours PRN        02/25/21 2314    omeprazole (PRILOSEC) 20 MG capsule  Daily        02/25/21 2315             Jacalyn Lefevre, MD 02/25/21 2328

## 2021-02-28 ENCOUNTER — Inpatient Hospital Stay (HOSPITAL_COMMUNITY): Payer: Medicare Other | Admitting: Vascular Surgery

## 2021-02-28 ENCOUNTER — Other Ambulatory Visit: Payer: Self-pay

## 2021-02-28 ENCOUNTER — Encounter (HOSPITAL_COMMUNITY): Payer: Self-pay | Admitting: Neurosurgery

## 2021-02-28 ENCOUNTER — Encounter (HOSPITAL_COMMUNITY)
Admission: RE | Disposition: A | Discharge status: Home / Self Care | Payer: Self-pay | Source: Ambulatory Visit | Attending: Neurosurgery

## 2021-02-28 ENCOUNTER — Inpatient Hospital Stay (HOSPITAL_COMMUNITY): Payer: Medicare Other

## 2021-02-28 ENCOUNTER — Inpatient Hospital Stay (HOSPITAL_COMMUNITY)
Admission: RE | Admit: 2021-02-28 | Discharge: 2021-03-01 | DRG: 472 | Disposition: A | Discharge status: Home / Self Care | Payer: Medicare Other | Source: Ambulatory Visit | Attending: Neurosurgery | Admitting: Neurosurgery

## 2021-02-28 DIAGNOSIS — M2578 Osteophyte, vertebrae: Secondary | ICD-10-CM | POA: Diagnosis present

## 2021-02-28 DIAGNOSIS — M4722 Other spondylosis with radiculopathy, cervical region: Secondary | ICD-10-CM | POA: Diagnosis present

## 2021-02-28 DIAGNOSIS — G992 Myelopathy in diseases classified elsewhere: Secondary | ICD-10-CM | POA: Diagnosis not present

## 2021-02-28 DIAGNOSIS — Z79899 Other long term (current) drug therapy: Secondary | ICD-10-CM

## 2021-02-28 DIAGNOSIS — F17210 Nicotine dependence, cigarettes, uncomplicated: Secondary | ICD-10-CM | POA: Diagnosis not present

## 2021-02-28 DIAGNOSIS — G629 Polyneuropathy, unspecified: Secondary | ICD-10-CM | POA: Diagnosis present

## 2021-02-28 DIAGNOSIS — Z419 Encounter for procedure for purposes other than remedying health state, unspecified: Secondary | ICD-10-CM

## 2021-02-28 DIAGNOSIS — F419 Anxiety disorder, unspecified: Secondary | ICD-10-CM | POA: Diagnosis present

## 2021-02-28 DIAGNOSIS — M5412 Radiculopathy, cervical region: Secondary | ICD-10-CM | POA: Diagnosis present

## 2021-02-28 DIAGNOSIS — M4802 Spinal stenosis, cervical region: Principal | ICD-10-CM | POA: Diagnosis present

## 2021-02-28 DIAGNOSIS — K769 Liver disease, unspecified: Secondary | ICD-10-CM | POA: Diagnosis not present

## 2021-02-28 DIAGNOSIS — M1990 Unspecified osteoarthritis, unspecified site: Secondary | ICD-10-CM | POA: Diagnosis present

## 2021-02-28 DIAGNOSIS — E559 Vitamin D deficiency, unspecified: Secondary | ICD-10-CM | POA: Diagnosis not present

## 2021-02-28 DIAGNOSIS — E785 Hyperlipidemia, unspecified: Secondary | ICD-10-CM | POA: Diagnosis present

## 2021-02-28 DIAGNOSIS — Z7982 Long term (current) use of aspirin: Secondary | ICD-10-CM | POA: Diagnosis not present

## 2021-02-28 DIAGNOSIS — Z981 Arthrodesis status: Secondary | ICD-10-CM | POA: Diagnosis not present

## 2021-02-28 DIAGNOSIS — K05329 Chronic periodontitis, generalized, unspecified severity: Secondary | ICD-10-CM | POA: Diagnosis not present

## 2021-02-28 DIAGNOSIS — N529 Male erectile dysfunction, unspecified: Secondary | ICD-10-CM | POA: Diagnosis present

## 2021-02-28 DIAGNOSIS — M4322 Fusion of spine, cervical region: Secondary | ICD-10-CM | POA: Diagnosis not present

## 2021-02-28 DIAGNOSIS — M4712 Other spondylosis with myelopathy, cervical region: Secondary | ICD-10-CM | POA: Diagnosis present

## 2021-02-28 HISTORY — PX: ANTERIOR CERVICAL DECOMP/DISCECTOMY FUSION: SHX1161

## 2021-02-28 SURGERY — ANTERIOR CERVICAL DECOMPRESSION/DISCECTOMY FUSION 3 LEVELS
Anesthesia: General

## 2021-02-28 MED ORDER — DIPHENHYDRAMINE HCL 50 MG/ML IJ SOLN
INTRAMUSCULAR | Status: DC | PRN
  Administered 2021-02-28: 6.25 mg via INTRAVENOUS

## 2021-02-28 MED ORDER — PANTOPRAZOLE SODIUM 40 MG PO TBEC
40.0000 mg | DELAYED_RELEASE_TABLET | Freq: Every day | ORAL | Status: DC
  Administered 2021-02-28 – 2021-03-01 (×2): 40 mg via ORAL
  Filled 2021-02-28 (×2): qty 1

## 2021-02-28 MED ORDER — ACETAMINOPHEN 10 MG/ML IV SOLN
INTRAVENOUS | Status: DC | PRN
  Administered 2021-02-28: 1000 mg via INTRAVENOUS

## 2021-02-28 MED ORDER — LABETALOL HCL 5 MG/ML IV SOLN
INTRAVENOUS | Status: DC | PRN
  Administered 2021-02-28: 2.5 mg via INTRAVENOUS

## 2021-02-28 MED ORDER — FENTANYL CITRATE (PF) 250 MCG/5ML IJ SOLN
INTRAMUSCULAR | Status: AC
  Filled 2021-02-28: qty 5

## 2021-02-28 MED ORDER — CEFAZOLIN SODIUM-DEXTROSE 1-4 GM/50ML-% IV SOLN
1.0000 g | Freq: Three times a day (TID) | INTRAVENOUS | Status: AC
Start: 2021-02-28 — End: 2021-03-01
  Administered 2021-02-28 – 2021-03-01 (×2): 1 g via INTRAVENOUS
  Filled 2021-02-28 (×2): qty 50

## 2021-02-28 MED ORDER — POLYVINYL ALCOHOL 1.4 % OP SOLN
1.0000 [drp] | Freq: Every day | OPHTHALMIC | Status: DC
  Administered 2021-03-01: 1 [drp] via OPHTHALMIC
  Filled 2021-02-28: qty 15

## 2021-02-28 MED ORDER — CHLORHEXIDINE GLUCONATE CLOTH 2 % EX PADS: 6.0000 | MEDICATED_PAD | Freq: Once | CUTANEOUS | Status: DC

## 2021-02-28 MED ORDER — DEXMEDETOMIDINE (PRECEDEX) IN NS 20 MCG/5ML (4 MCG/ML) IV SYRINGE
PREFILLED_SYRINGE | INTRAVENOUS | Status: DC | PRN
  Administered 2021-02-28 (×3): 20 ug via INTRAVENOUS

## 2021-02-28 MED ORDER — FLUOXETINE HCL 20 MG PO CAPS
40.0000 mg | ORAL_CAPSULE | Freq: Every day | ORAL | Status: DC
  Administered 2021-02-28 – 2021-03-01 (×2): 40 mg via ORAL
  Filled 2021-02-28 (×2): qty 2

## 2021-02-28 MED ORDER — BUSPIRONE HCL 10 MG PO TABS
10.0000 mg | ORAL_TABLET | Freq: Two times a day (BID) | ORAL | Status: DC
  Administered 2021-02-28 – 2021-03-01 (×2): 10 mg via ORAL
  Filled 2021-02-28 (×3): qty 1

## 2021-02-28 MED ORDER — MIDAZOLAM HCL 2 MG/2ML IJ SOLN
INTRAMUSCULAR | Status: AC
  Filled 2021-02-28: qty 2

## 2021-02-28 MED ORDER — ESMOLOL HCL 100 MG/10ML IV SOLN
INTRAVENOUS | Status: DC | PRN
  Administered 2021-02-28 (×2): 10 mg via INTRAVENOUS

## 2021-02-28 MED ORDER — ONDANSETRON HCL 4 MG/2ML IJ SOLN: 4.0000 mg | Freq: Four times a day (QID) | INTRAMUSCULAR | Status: DC | PRN

## 2021-02-28 MED ORDER — CEFAZOLIN SODIUM-DEXTROSE 2-4 GM/100ML-% IV SOLN
2.0000 g | INTRAVENOUS | Status: AC
  Administered 2021-02-28: 2 g via INTRAVENOUS
  Filled 2021-02-28: qty 100

## 2021-02-28 MED ORDER — ACETAMINOPHEN 650 MG RE SUPP: 650.0000 mg | RECTAL | Status: DC | PRN

## 2021-02-28 MED ORDER — AMISULPRIDE (ANTIEMETIC) 5 MG/2ML IV SOLN: 10.0000 mg | Freq: Once | INTRAVENOUS | Status: DC | PRN

## 2021-02-28 MED ORDER — CHLORHEXIDINE GLUCONATE 0.12 % MT SOLN
15.0000 mL | Freq: Once | OROMUCOSAL | Status: AC
  Administered 2021-02-28: 15 mL via OROMUCOSAL
  Filled 2021-02-28: qty 15

## 2021-02-28 MED ORDER — CYCLOBENZAPRINE HCL 10 MG PO TABS
10.0000 mg | ORAL_TABLET | Freq: Three times a day (TID) | ORAL | Status: DC | PRN
  Administered 2021-02-28: 10 mg via ORAL
  Filled 2021-02-28: qty 1

## 2021-02-28 MED ORDER — THROMBIN 5000 UNITS EX SOLR
CUTANEOUS | Status: AC
  Filled 2021-02-28: qty 5000

## 2021-02-28 MED ORDER — SUGAMMADEX SODIUM 200 MG/2ML IV SOLN
INTRAVENOUS | Status: DC | PRN
  Administered 2021-02-28: 200 mg via INTRAVENOUS

## 2021-02-28 MED ORDER — PHENOL 1.4 % MT LIQD: 1.0000 | OROMUCOSAL | Status: DC | PRN

## 2021-02-28 MED ORDER — HYDROMORPHONE HCL 1 MG/ML IJ SOLN: 0.2500 mg | INTRAMUSCULAR | Status: DC | PRN

## 2021-02-28 MED ORDER — PRAVASTATIN SODIUM 10 MG PO TABS
20.0000 mg | ORAL_TABLET | Freq: Every day | ORAL | Status: DC
  Administered 2021-02-28 – 2021-03-01 (×2): 20 mg via ORAL
  Filled 2021-02-28 (×2): qty 2

## 2021-02-28 MED ORDER — HYDROCODONE-ACETAMINOPHEN 5-325 MG PO TABS
1.0000 | ORAL_TABLET | ORAL | Status: DC | PRN
Start: 2021-02-28 — End: 2021-03-01
  Administered 2021-02-28: 1 via ORAL
  Filled 2021-02-28: qty 1

## 2021-02-28 MED ORDER — MENTHOL 3 MG MT LOZG
1.0000 | LOZENGE | OROMUCOSAL | Status: DC | PRN
  Filled 2021-02-28: qty 9

## 2021-02-28 MED ORDER — ONDANSETRON HCL 4 MG/2ML IJ SOLN
INTRAMUSCULAR | Status: DC | PRN
  Administered 2021-02-28: 4 mg via INTRAVENOUS

## 2021-02-28 MED ORDER — LACTATED RINGERS IV SOLN: INTRAVENOUS | Status: DC

## 2021-02-28 MED ORDER — DEXMEDETOMIDINE HCL IN NACL 80 MCG/20ML IV SOLN
INTRAVENOUS | Status: AC
  Filled 2021-02-28: qty 20

## 2021-02-28 MED ORDER — SODIUM CHLORIDE 0.9% FLUSH
3.0000 mL | Freq: Two times a day (BID) | INTRAVENOUS | Status: DC
  Administered 2021-02-28: 3 mL via INTRAVENOUS

## 2021-02-28 MED ORDER — SODIUM CHLORIDE 0.9% FLUSH: 3.0000 mL | INTRAVENOUS | Status: DC | PRN

## 2021-02-28 MED ORDER — THROMBIN 20000 UNITS EX SOLR
CUTANEOUS | Status: DC | PRN
  Administered 2021-02-28: 20 mL via TOPICAL

## 2021-02-28 MED ORDER — SODIUM CHLORIDE 0.9 % IV SOLN: 250.0000 mL | INTRAVENOUS | Status: DC

## 2021-02-28 MED ORDER — DEXAMETHASONE SODIUM PHOSPHATE 10 MG/ML IJ SOLN: 10.0000 mg | Freq: Once | INTRAMUSCULAR | Status: DC

## 2021-02-28 MED ORDER — PROPOFOL 10 MG/ML IV BOLUS
INTRAVENOUS | Status: AC
  Filled 2021-02-28: qty 20

## 2021-02-28 MED ORDER — LIDOCAINE 2% (20 MG/ML) 5 ML SYRINGE
INTRAMUSCULAR | Status: DC | PRN
  Administered 2021-02-28: 60 mg via INTRAVENOUS

## 2021-02-28 MED ORDER — FOLIC ACID 1 MG PO TABS
1.0000 mg | ORAL_TABLET | Freq: Every day | ORAL | Status: DC
  Administered 2021-02-28 – 2021-03-01 (×2): 1 mg via ORAL
  Filled 2021-02-28 (×2): qty 1

## 2021-02-28 MED ORDER — HYDROCODONE-ACETAMINOPHEN 10-325 MG PO TABS
2.0000 | ORAL_TABLET | ORAL | Status: DC | PRN
  Administered 2021-03-01: 2 via ORAL
  Filled 2021-02-28 (×2): qty 2

## 2021-02-28 MED ORDER — CHLORHEXIDINE GLUCONATE 0.12% ORAL RINSE (MEDLINE KIT)
15.0000 mL | Freq: Every day | OROMUCOSAL | Status: DC
  Administered 2021-02-28: 15 mL via OROMUCOSAL

## 2021-02-28 MED ORDER — ONDANSETRON HCL 4 MG PO TABS: 4.0000 mg | ORAL_TABLET | Freq: Four times a day (QID) | ORAL | Status: DC | PRN

## 2021-02-28 MED ORDER — ONDANSETRON HCL 4 MG/2ML IJ SOLN: 4.0000 mg | Freq: Once | INTRAMUSCULAR | Status: DC | PRN

## 2021-02-28 MED ORDER — ROCURONIUM BROMIDE 10 MG/ML (PF) SYRINGE
PREFILLED_SYRINGE | INTRAVENOUS | Status: DC | PRN
  Administered 2021-02-28 (×2): 10 mg via INTRAVENOUS
  Administered 2021-02-28: 20 mg via INTRAVENOUS
  Administered 2021-02-28: 10 mg via INTRAVENOUS
  Administered 2021-02-28: 50 mg via INTRAVENOUS

## 2021-02-28 MED ORDER — LACTATED RINGERS IV SOLN: INTRAVENOUS | Status: DC | PRN

## 2021-02-28 MED ORDER — ACETAMINOPHEN 325 MG PO TABS: 650.0000 mg | ORAL_TABLET | ORAL | Status: DC | PRN

## 2021-02-28 MED ORDER — ACETAMINOPHEN 10 MG/ML IV SOLN
INTRAVENOUS | Status: AC
  Filled 2021-02-28: qty 100

## 2021-02-28 MED ORDER — FENTANYL CITRATE (PF) 250 MCG/5ML IJ SOLN
INTRAMUSCULAR | Status: DC | PRN
  Administered 2021-02-28: 100 ug via INTRAVENOUS
  Administered 2021-02-28 (×5): 50 ug via INTRAVENOUS

## 2021-02-28 MED ORDER — PROPOFOL 10 MG/ML IV BOLUS
INTRAVENOUS | Status: DC | PRN
  Administered 2021-02-28: 130 mg via INTRAVENOUS

## 2021-02-28 MED ORDER — GABAPENTIN 600 MG PO TABS
600.0000 mg | ORAL_TABLET | Freq: Three times a day (TID) | ORAL | Status: DC
  Administered 2021-02-28 – 2021-03-01 (×3): 600 mg via ORAL
  Filled 2021-02-28 (×3): qty 1

## 2021-02-28 MED ORDER — OXYCODONE HCL 5 MG/5ML PO SOLN: 5.0000 mg | Freq: Once | ORAL | Status: DC | PRN

## 2021-02-28 MED ORDER — PHENYLEPHRINE HCL (PRESSORS) 10 MG/ML IV SOLN
INTRAVENOUS | Status: AC
  Filled 2021-02-28: qty 2

## 2021-02-28 MED ORDER — VITAMIN D 25 MCG (1000 UNIT) PO TABS
1000.0000 [IU] | ORAL_TABLET | Freq: Every day | ORAL | Status: DC
  Administered 2021-03-01: 1000 [IU] via ORAL
  Filled 2021-02-28: qty 1

## 2021-02-28 MED ORDER — THIAMINE HCL 100 MG PO TABS
100.0000 mg | ORAL_TABLET | Freq: Every day | ORAL | Status: DC
  Administered 2021-02-28 – 2021-03-01 (×2): 100 mg via ORAL
  Filled 2021-02-28 (×2): qty 1

## 2021-02-28 MED ORDER — DEXAMETHASONE SODIUM PHOSPHATE 10 MG/ML IJ SOLN
INTRAMUSCULAR | Status: DC | PRN
Start: 2021-02-28 — End: 2021-02-28
  Administered 2021-02-28: 10 mg via INTRAVENOUS

## 2021-02-28 MED ORDER — HYDROMORPHONE HCL 1 MG/ML IJ SOLN: 1.0000 mg | INTRAMUSCULAR | Status: DC | PRN

## 2021-02-28 MED ORDER — ORAL CARE MOUTH RINSE: 15.0000 mL | Freq: Once | OROMUCOSAL | Status: AC

## 2021-02-28 MED ORDER — ONDANSETRON 4 MG PO TBDP: 4.0000 mg | ORAL_TABLET | Freq: Three times a day (TID) | ORAL | Status: DC | PRN

## 2021-02-28 MED ORDER — MIDAZOLAM HCL 5 MG/5ML IJ SOLN
INTRAMUSCULAR | Status: DC | PRN
Start: 2021-02-28 — End: 2021-02-28
  Administered 2021-02-28 (×2): 2 mg via INTRAVENOUS

## 2021-02-28 MED ORDER — OXYCODONE HCL 5 MG PO TABS: 5.0000 mg | ORAL_TABLET | Freq: Once | ORAL | Status: DC | PRN

## 2021-02-28 MED ORDER — THROMBIN 5000 UNITS EX SOLR
OROMUCOSAL | Status: DC | PRN
  Administered 2021-02-28: 5 mL via TOPICAL

## 2021-02-28 MED ORDER — THROMBIN 20000 UNITS EX SOLR
CUTANEOUS | Status: AC
  Filled 2021-02-28: qty 20000

## 2021-02-28 MED ORDER — 0.9 % SODIUM CHLORIDE (POUR BTL) OPTIME
TOPICAL | Status: DC | PRN
  Administered 2021-02-28: 1000 mL

## 2021-02-28 SURGICAL SUPPLY — 56 items
APL SKNCLS STERI-STRIP NONHPOA (GAUZE/BANDAGES/DRESSINGS) ×1
BAG COUNTER SPONGE SURGICOUNT (BAG) ×2 IMPLANT
BAG DECANTER FOR FLEXI CONT (MISCELLANEOUS) ×2 IMPLANT
BAG SPNG CNTER NS LX DISP (BAG) ×1
BAND INSRT 18 STRL LF DISP RB (MISCELLANEOUS) ×2
BAND RUBBER #18 3X1/16 STRL (MISCELLANEOUS) ×4 IMPLANT
BENZOIN TINCTURE PRP APPL 2/3 (GAUZE/BANDAGES/DRESSINGS) ×2 IMPLANT
BUR MATCHSTICK NEURO 3.0 LAGG (BURR) ×2 IMPLANT
CAGE PEEK 6X14X11 (Cage) ×6 IMPLANT
CANISTER SUCT 3000ML PPV (MISCELLANEOUS) ×2 IMPLANT
CARTRIDGE OIL MAESTRO DRILL (MISCELLANEOUS) ×1 IMPLANT
CLOSURE STERI-STRIP 1/4X4 (GAUZE/BANDAGES/DRESSINGS) ×1 IMPLANT
DIFFUSER DRILL AIR PNEUMATIC (MISCELLANEOUS) ×2 IMPLANT
DRAPE C-ARM 42X72 X-RAY (DRAPES) ×4 IMPLANT
DRAPE LAPAROTOMY 100X72 PEDS (DRAPES) ×2 IMPLANT
DRAPE MICROSCOPE LEICA (MISCELLANEOUS) ×2 IMPLANT
DURAPREP 6ML APPLICATOR 50/CS (WOUND CARE) ×2 IMPLANT
ELECT COATED BLADE 2.86 ST (ELECTRODE) ×2 IMPLANT
ELECT REM PT RETURN 9FT ADLT (ELECTROSURGICAL) ×2
ELECTRODE REM PT RTRN 9FT ADLT (ELECTROSURGICAL) ×1 IMPLANT
GAUZE 4X4 16PLY ~~LOC~~+RFID DBL (SPONGE) IMPLANT
GAUZE SPONGE 4X4 12PLY STRL (GAUZE/BANDAGES/DRESSINGS) ×2 IMPLANT
GLOVE EXAM NITRILE XL STR (GLOVE) IMPLANT
GLOVE SURG LTX SZ9 (GLOVE) ×2 IMPLANT
GOWN STRL REUS W/ TWL LRG LVL3 (GOWN DISPOSABLE) IMPLANT
GOWN STRL REUS W/ TWL XL LVL3 (GOWN DISPOSABLE) IMPLANT
GOWN STRL REUS W/TWL 2XL LVL3 (GOWN DISPOSABLE) IMPLANT
GOWN STRL REUS W/TWL LRG LVL3 (GOWN DISPOSABLE)
GOWN STRL REUS W/TWL XL LVL3 (GOWN DISPOSABLE)
HALTER HD/CHIN CERV TRACTION D (MISCELLANEOUS) ×2 IMPLANT
HEMOSTAT POWDER KIT SURGIFOAM (HEMOSTASIS) ×2 IMPLANT
KIT BASIN OR (CUSTOM PROCEDURE TRAY) ×2 IMPLANT
KIT TURNOVER KIT B (KITS) ×2 IMPLANT
NDL SPNL 20GX3.5 QUINCKE YW (NEEDLE) ×1 IMPLANT
NEEDLE SPNL 20GX3.5 QUINCKE YW (NEEDLE) ×2 IMPLANT
NS IRRIG 1000ML POUR BTL (IV SOLUTION) ×2 IMPLANT
OIL CARTRIDGE MAESTRO DRILL (MISCELLANEOUS) ×2
PACK LAMINECTOMY NEURO (CUSTOM PROCEDURE TRAY) ×2 IMPLANT
PAD ARMBOARD 7.5X6 YLW CONV (MISCELLANEOUS) ×6 IMPLANT
PENCIL BUTTON HOLSTER BLD 10FT (ELECTRODE) ×1 IMPLANT
PLATE 3 60XNS SPNE CVD ANT T (Plate) IMPLANT
PLATE 3 ATLANTIS TRANS (Plate) ×2 IMPLANT
SCREW 4.0X13 (Screw) ×16 IMPLANT
SCREW BN 13X4XSLF DRL FXANG (Screw) IMPLANT
SPACER SPNL 11X14X6XPEEK CVD (Cage) IMPLANT
SPCR SPNL 11X14X6XPEEK CVD (Cage) ×3 IMPLANT
SPONGE INTESTINAL PEANUT (DISPOSABLE) ×2 IMPLANT
SPONGE SURGIFOAM ABS GEL 100 (HEMOSTASIS) ×2 IMPLANT
STRIP CLOSURE SKIN 1/2X4 (GAUZE/BANDAGES/DRESSINGS) ×2 IMPLANT
SUT VIC AB 3-0 SH 8-18 (SUTURE) ×2 IMPLANT
SUT VIC AB 4-0 RB1 18 (SUTURE) ×2 IMPLANT
TAPE CLOTH 4X10 WHT NS (GAUZE/BANDAGES/DRESSINGS) ×2 IMPLANT
TOWEL GREEN STERILE (TOWEL DISPOSABLE) ×2 IMPLANT
TOWEL GREEN STERILE FF (TOWEL DISPOSABLE) ×2 IMPLANT
TRAP SPECIMEN MUCUS 40CC (MISCELLANEOUS) ×2 IMPLANT
WATER STERILE IRR 1000ML POUR (IV SOLUTION) ×2 IMPLANT

## 2021-02-28 NOTE — Progress Notes (Signed)
2mg  versed given at bedside by dr 

## 2021-02-28 NOTE — Transfer of Care (Signed)
Immediate Anesthesia Transfer of Care Note  Patient: Leonard Ramos  Procedure(s) Performed: Anterior Cervical Discectomy Fusion - Cervical three-Cervical four - Cervical four-Cervical five - Cervical five-Cervical six  Patient Location: PACU  Anesthesia Type:General  Level of Consciousness: awake  Airway & Oxygen Therapy: Patient Spontanous Breathing and Patient connected to nasal cannula oxygen  Post-op Assessment: Report given to RN and Post -op Vital signs reviewed and stable  Post vital signs: Reviewed and stable  Last Vitals:  Vitals Value Taken Time  BP 140/92 02/28/21 1248  Temp 36.7 C 02/28/21 1245  Pulse 82 02/28/21 1253  Resp 14 02/28/21 1253  SpO2 96 % 02/28/21 1253  Vitals shown include unvalidated device data.  Last Pain:  Vitals:   02/28/21 1251  TempSrc:   PainSc: Asleep     Called Oddono MD regarding patient not being redirectable by verbal commands, needing guidance regarding staying in stretcher and becoming irritable. Noting last drink per patient reports of 02/26/2021 - versed administered, VSS.   Complications: No notable events documented.

## 2021-02-28 NOTE — Op Note (Signed)
Date of procedure: 02/28/2021  Date of dictation: Same  Service: Neurosurgery  Preoperative diagnosis: Cervical stenosis with myelopathy  Postoperative diagnosis: Same  Procedure Name: C3-4, C4-5, C5-6 anterior cervical discectomy with interbody fusion utilizing interbody peek cage, AND autograft, and anterior plate instrumentation  Surgeon:Valine Drozdowski A.Ward Boissonneault, M.D.  Asst. Surgeon: Doran Durand, NP  Anesthesia: General  Indication: Patient is 65 year old male with progressive bilateral upper and lower extremity numbness and some weakness with progressive gait instability.  Work-up demonstrates evidence of marked cervical spondylosis with stenosis and cord compression with some high signal behind the C3-4 disc space.  Patient also with significant stenosis at C4-5 and C5-6.  Patient presents now for 3 level anterior cervical decompression and fusion in hopes of improving his symptoms.  Operative note: After induction of anesthesia, patient positioned supine with neck slightly extended and held placed halter traction.  Patient's anterior cervical region prepped draped sterilely.  Incision made overlying C4-5.  Dissection performed on the right.  Retractor placed.  Fluoroscopy used.  Levels confirmed.  The spaces at all 3 levels were incised.  Discectomies then performed using various instruments down to level of posterior annulus.  Microscope then brought to field used throughout the remainder of the discectomy remaining aspects of annulus and osteophytes removed using high-speed drill down to level of posterior logical limb.  Posterior margin was then elevated and resected in piecemeal fashion.  Underlying thecal sac was identified.  A wide central decompression then performed undercutting the bodies of C3 and C4.  Decompression then proceeded external foramina.  Wide anterior foraminotomies performed on the course exiting nerve roots.  At this point a very thorough decompression achieved.  There was no  evidence of injury to thecal sac or nerve roots.  Procedure was then repeated at C4-5 and C5-6 again without complications.  Wound is then irrigated.  Gelfoam placed topically for hemostasis then removed.  [employer]onic anatomic peek cages were then packed with locally harvested autograft.  Each cage was then impacted in place and recessed slightly from the anterior cortical margin.  Medtronic Atlantis translational plate was then placed over the C3, C4, C5 and C6 levels.  This then attached under fluoroscopic guidance using 13 mm fixed angle screws to each in all 4 levels.  All screws given final tightening found to be solidly within the bone.  Locking screws were engaged.  Final images reveal good position of cages and the hardware at the proper operative level with normal alignment of spine.  Wounds then irrigated 1 final time.  Hemostasis was assured.  Wounds and closed in layers with Vicryl sutures.  Steri-Strips and sterile dressing were applied.  No apparent complications.  Patient tolerated the procedure well and he returns to the recovery room postop. 02/28/2021

## 2021-02-28 NOTE — Brief Op Note (Signed)
02/28/2021  12:13 PM  PATIENT:  [name]  65 y.o. male  PRE-OPERATIVE DIAGNOSIS:  Stenosis  POST-OPERATIVE DIAGNOSIS:  Stenosis  PROCEDURE:  Procedure(s): Anterior Cervical Discectomy Fusion - Cervical three-Cervical four - Cervical four-Cervical five - Cervical five-Cervical six (N/A)  SURGEON:  Surgeon(s) and Role:    Julio Sicks, MD - Primary  PHYSICIAN ASSISTANT:   ASSISTANTSMarland Mcalpine   ANESTHESIA:   general  EBL:  100 mL   BLOOD ADMINISTERED:none  DRAINS: none   LOCAL MEDICATIONS USED:  NONE  SPECIMEN:  No Specimen  DISPOSITION OF SPECIMEN:  N/A  COUNTS:  YES  TOURNIQUET:  * No tourniquets in log *  DICTATION: .Dragon Dictation  PLAN OF CARE: Admit to inpatient   PATIENT DISPOSITION:  PACU - hemodynamically stable.   Delay start of Pharmacological VTE agent (>24hrs) due to surgical blood loss or risk of bleeding: yes

## 2021-02-28 NOTE — Anesthesia Procedure Notes (Addendum)
Procedure Name: Intubation Date/Time: 02/28/2021 9:47 AM Performed by: Zollie Scale, CRNA Pre-anesthesia Checklist: Patient identified, Emergency Drugs available, Suction available and Patient being monitored Patient Re-evaluated:Patient Re-evaluated prior to induction Oxygen Delivery Method: Circle System Utilized Preoxygenation: Pre-oxygenation with 100% oxygen Induction Type: IV induction Ventilation: Mask ventilation without difficulty and Oral airway inserted - appropriate to patient size Laryngoscope Size: Glidescope and 4 Grade View: Grade I Tube type: Oral Tube size: 7.5 mm Number of attempts: 1 Airway Equipment and Method: Stylet Placement Confirmation: ETT inserted through vocal cords under direct vision, positive ETCO2 and breath sounds checked- equal and bilateral Secured at: 21 cm Tube secured with: Tape Dental Injury: Teeth and Oropharynx as per pre-operative assessment  Comments: Patient reported comfort in neck and arms prior to induction of anesthesia. Glidescope utilized d/t neck precautions (sensory and motor loss via upper limbs), neck stability maintained during intubation.

## 2021-02-28 NOTE — Progress Notes (Signed)
Arrived into [facility] with questionnabblle oreintation outside of self/ agaitated, attempting oob, hanging onto rails, wants OOB / repeated doses of dextomidate x 2 by crna along with propofol / dr Andres Ege arrived at bedside to monitor, assess.

## 2021-02-28 NOTE — H&P (Signed)
Leonard Ramos is an 65 y.o. male.   Chief Complaint: Weakness HPI: 65 year old male with progressive bilateral upper extremity sensory loss with associated weakness.  Patient also notes marked gait instability.  Patient with increasing spasticity.  Work-up demonstrates evidence of marked multilevel spondylosis with stenosis and cord compression.  Patient presents now for 3 level anterior cervical decompression and fusion in hopes improving his symptoms.  Past Medical History:  Diagnosis Date   Alcohol use    Anxiety    Arthritis    Hyperlipidemia    Liver disease    Neuropathy    Periodontitis, chronic, generalized    Tobacco use    Vitamin D deficiency 03/2019    Past Surgical History:  Procedure Laterality Date   MULTIPLE EXTRACTIONS WITH ALVEOLOPLASTY Bilateral 09/04/2019   Procedure: MULTIPLE EXTRACTION W/ ALVEOLOPLASTY, Removal of left maxillary buccal exostosis;  Surgeon: Ocie Doyne, DDS;  Location: [facility] OR;  Service: Oral Surgery;  Laterality: Bilateral;   NO PAST SURGERIES      Family History  Problem Relation Age of Onset   CAD Father    Social History:  reports that he has been smoking cigarettes. He has been smoking an average of 1 pack per day. He has never used smokeless tobacco. He reports current alcohol use of about 40.0 standard drinks per week. He reports current drug use. Drugs: Marijuana and Cocaine.  Allergies: No Known Allergies  Medications Prior to Admission  Medication Sig Dispense Refill   aspirin 81 MG chewable tablet Chew 81 mg by mouth daily.     busPIRone (BUSPAR) 10 MG tablet TAKE 1 TABLET (10 MG TOTAL) BY MOUTH 2 (TWO) TIMES DAILY. 180 tablet 3   Carboxymethylcellul-Glycerin (LUBRICATING EYE DROPS OP) Place 1 drop into both eyes daily.     chlorhexidine (PERIDEX) 0.12 % solution Use as directed 15 mLs in the mouth or throat daily. 473 mL 6   cholecalciferol (VITAMIN D3) 25 MCG (1000 UNIT) tablet Take 1,000 Units by mouth daily.     FLUoxetine  (PROZAC) 40 MG capsule Take 1 capsule (40 mg total) by mouth every other day for 7 days, THEN 1 capsule (40 mg total) every 36 (thirty-six) hours for 7 days, THEN 1 capsule (40 mg total) once a week for 7 days. 30 capsule 0   folic acid (FOLVITE) 1 MG tablet Take 1 tablet (1 mg total) by mouth daily. 90 tablet 3   gabapentin (NEURONTIN) 600 MG tablet Take 1 tablet (600 mg total) by mouth 3 (three) times daily. 270 tablet 3   pravastatin (PRAVACHOL) 20 MG tablet TAKE 1 TABLET (20 MG TOTAL) BY MOUTH DAILY. 90 tablet 3   sildenafil (VIAGRA) 100 MG tablet Take 1 tablet (100 mg total) by mouth daily as needed for erectile dysfunction. 10 tablet 3   thiamine (VITAMIN B-1) 100 MG tablet Take 1 tablet (100 mg total) by mouth daily. 30 tablet 6   mirtazapine (REMERON SOL-TAB) 30 MG disintegrating tablet Take 1 tablet (30 mg total) by mouth at bedtime. (Patient not taking: Reported on 02/28/2021) 90 tablet 3   naproxen (NAPROSYN) 500 MG tablet Take 1 tablet (500 mg total) by mouth 2 (two) times daily. (Patient not taking: Reported on 02/28/2021) 30 tablet 0   omeprazole (PRILOSEC) 20 MG capsule Take 1 capsule (20 mg total) by mouth daily. 30 capsule 0   ondansetron (ZOFRAN ODT) 4 MG disintegrating tablet Take 1 tablet (4 mg total) by mouth every 8 (eight) hours as needed for nausea  or vomiting. 20 tablet 0    No results found for this or any previous visit (from the past 48 hour(s)). No results found.  Pertinent items noted in HPI and remainder of comprehensive ROS otherwise negative.  Blood pressure (!) 160/86, pulse 72, temperature 98.1 F (36.7 C), temperature source Oral, resp. rate 20, height 5' 11.5" (1.816 m), weight 62.6 kg, SpO2 99 %.  Patient is awake and alert.  He is oriented and appropriate.  Speech is fluent.  Judgment insight are intact.  Cranial nerve function normal lateral.  Motor examination reveals mild weakness of both grips and intrinsics bilaterally.  Lower extremity muscle tone is  increased.  Sensory examination patchy distal sensory loss in both upper extremities.  Reflexes are hyperactive.  Hoffmann's responses in both hands.  Toes are upgoing to plantar stimulation.  Examination head ears eyes nose and throat is unremarked.  Chest and abdomen are benign.  Extremities are free of major deformity. Assessment/Plan Cervical stenosis with myelopathy.  Plan C3-4, C4-5, C5-6 anterior cervical segment with interbody fusion utilizing interbody cages, local harvested autograft, and anterior plate instrumentation.  Risks and benefits of been explained.  Patient wishes to proceed.  Leonard Ramos 02/28/2021, 9:18 AM

## 2021-03-01 ENCOUNTER — Encounter (HOSPITAL_COMMUNITY): Payer: Self-pay | Admitting: Neurosurgery

## 2021-03-01 MED ORDER — CYCLOBENZAPRINE HCL 10 MG PO TABS: 10.0000 mg | ORAL_TABLET | Freq: Three times a day (TID) | ORAL | 0 refills | Status: DC | PRN

## 2021-03-01 MED ORDER — HYDROCODONE-ACETAMINOPHEN 5-325 MG PO TABS: 1.0000 | ORAL_TABLET | ORAL | 0 refills | Status: DC | PRN

## 2021-03-01 NOTE — Progress Notes (Signed)
HPI: Follow-up cardiomyopathy.  Patient seen July 2022 for preoperative evaluation.  Nuclear study July 2022 showed ejection fraction 44% with inferior apical thinning but no ischemia.  Echocardiogram August 2022 showed ejection fraction 45 to 50%, mild left ventricular hypertrophy, grade 1 diastolic dysfunction.  I personally reviewed the echocardiogram and LV function appears to be low normal.  Since last seen there is no dyspnea, chest pain, palpitations or syncope.  He continues to smoke and consumes approximately 80 ounces of beer daily.  He last used cocaine 1 month ago by his report.  Current Outpatient Medications  Medication Sig Dispense Refill   aspirin 81 MG chewable tablet Chew 81 mg by mouth daily.     busPIRone (BUSPAR) 10 MG tablet TAKE 1 TABLET (10 MG TOTAL) BY MOUTH 2 (TWO) TIMES DAILY. 180 tablet 3   Carboxymethylcellul-Glycerin (LUBRICATING EYE DROPS OP) Place 1 drop into both eyes daily.     chlorhexidine (PERIDEX) 0.12 % solution Use as directed 15 mLs in the mouth or throat daily. 473 mL 6   cholecalciferol (VITAMIN D3) 25 MCG (1000 UNIT) tablet Take 1,000 Units by mouth daily.     cyclobenzaprine (FLEXERIL) 10 MG tablet Take 1 tablet (10 mg total) by mouth 3 (three) times daily as needed for muscle spasms. 30 tablet 0   FLUoxetine (PROZAC) 40 MG capsule Take 1 capsule (40 mg total) by mouth every other day for 7 days, THEN 1 capsule (40 mg total) every 36 (thirty-six) hours for 7 days, THEN 1 capsule (40 mg total) once a week for 7 days. 30 capsule 0   folic acid (FOLVITE) 1 MG tablet Take 1 tablet (1 mg total) by mouth daily. 90 tablet 3   gabapentin (NEURONTIN) 600 MG tablet Take 1 tablet (600 mg total) by mouth 3 (three) times daily. 270 tablet 3   HYDROcodone-acetaminophen (NORCO/VICODIN) 5-325 MG tablet Take 1 tablet by mouth every 4 (four) hours as needed for moderate pain ((score 4 to 6)). 30 tablet 0   omeprazole (PRILOSEC) 20 MG capsule Take 1 capsule (20 mg  total) by mouth daily. 30 capsule 0   ondansetron (ZOFRAN ODT) 4 MG disintegrating tablet Take 1 tablet (4 mg total) by mouth every 8 (eight) hours as needed for nausea or vomiting. 20 tablet 0   pravastatin (PRAVACHOL) 20 MG tablet TAKE 1 TABLET (20 MG TOTAL) BY MOUTH DAILY. 90 tablet 3   sildenafil (VIAGRA) 100 MG tablet Take 1 tablet (100 mg total) by mouth daily as needed for erectile dysfunction. 10 tablet 3   thiamine (VITAMIN B-1) 100 MG tablet Take 1 tablet (100 mg total) by mouth daily. 30 tablet 6   No current facility-administered medications for this visit.     Past Medical History:  Diagnosis Date   Alcohol use    Anxiety    Arthritis    Hyperlipidemia    Liver disease    Neuropathy    Periodontitis, chronic, generalized    Tobacco use    Vitamin D deficiency 03/2019    Past Surgical History:  Procedure Laterality Date   ANTERIOR CERVICAL DECOMP/DISCECTOMY FUSION N/A 02/28/2021   Procedure: Anterior Cervical Discectomy Fusion - Cervical three-Cervical four - Cervical four-Cervical five - Cervical five-Cervical six;  Surgeon: Julio Sicks, MD;  Location: [facility] OR;  Service: Neurosurgery;  Laterality: N/A;   MULTIPLE EXTRACTIONS WITH ALVEOLOPLASTY Bilateral 09/04/2019   Procedure: MULTIPLE EXTRACTION W/ ALVEOLOPLASTY, Removal of left maxillary buccal exostosis;  Surgeon: Ocie Doyne, DDS;  Location: [facility] OR;  Service: Oral Surgery;  Laterality: Bilateral;   NO PAST SURGERIES      Social History   Socioeconomic History   Marital status: Single    Spouse name: Not on file   Number of children: 4   Years of education: Not on file   Highest education level: Not on file  Occupational History   Not on file  Tobacco Use   Smoking status: Every Day    Packs/day: 1.00    Types: Cigarettes   Smokeless tobacco: Never  Vaping Use   Vaping Use: Never used  Substance and Sexual Activity   Alcohol use: Yes    Alcohol/week: 40.0 standard drinks    Types: 40 Standard drinks  or equivalent per week    Comment: States I drink beer all day long   Drug use: Yes    Types: Marijuana, Cocaine    Comment: Patient stated I smoke marijuana everyday   Sexual activity: Yes    Partners: Female  Other Topics Concern   Not on file  Social History Narrative   Right handed   Lives in a 8 story building    Drinks a pepsi occasionally    Social Determinants of Health   Financial Resource Strain: Not on file  Food Insecurity: Not on file  Transportation Needs: Not on file  Physical Activity: Not on file  Stress: Not on file  Social Connections: Not on file  Intimate Partner Violence: Not on file    Family History  Problem Relation Age of Onset   CAD Father     ROS: no fevers or chills, productive cough, hemoptysis, dysphasia, odynophagia, melena, hematochezia, dysuria, hematuria, rash, seizure activity, orthopnea, PND, pedal edema, claudication. Remaining systems are negative.  Physical Exam: Well-developed well-nourished in no acute distress.  Skin is warm and dry.  HEENT is normal.  Neck is supple.  Chest is clear to auscultation with normal expansion.  Cardiovascular exam is regular rate and rhythm.  Abdominal exam nontender or distended. No masses palpated. Extremities show no edema. neuro grossly intact  ECG-normal sinus rhythm with no ST changes.  Personally reviewed  A/P  1 cardiomyopathy-I have personally reviewed the patient's echocardiogram and it appears to show low normal LV function.  2 hyperlipidemia-continue statin.  3 tobacco abuse, alcohol abuse, cocaine abuse-patient again counseled on avoiding.  Olga Millers, MD

## 2021-03-01 NOTE — Discharge Instructions (Signed)

## 2021-03-01 NOTE — Progress Notes (Signed)
Patient was transported to his vehicle via wheelchair by RN for discharge home; in no acute distress nor complaints of pain nor discomfort; steri strips on his anterior neck was clean, dry and intact with a soft collar on; room was checked and accounted for all his belongings; discharge instructions concerning medications, wound care, follow up appointment and when to call the doctor were all discussed with the patient and he verbalized understanding on the instructions given.

## 2021-03-01 NOTE — Anesthesia Postprocedure Evaluation (Signed)
Anesthesia Post Note  Patient: Leonard Ramos  Procedure(s) Performed: Anterior Cervical Discectomy Fusion - Cervical three-Cervical four - Cervical four-Cervical five - Cervical five-Cervical six     Patient location during evaluation: PACU Anesthesia Type: General Level of consciousness: awake and alert Pain management: pain level controlled Vital Signs Assessment: post-procedure vital signs reviewed and stable Respiratory status: spontaneous breathing, nonlabored ventilation, respiratory function stable and patient connected to nasal cannula oxygen Cardiovascular status: blood pressure returned to baseline and stable Postop Assessment: no apparent nausea or vomiting Anesthetic complications: no   No notable events documented.  Last Vitals:  Vitals:   03/01/21 0337 03/01/21 0730  BP: 132/84 132/90  Pulse: 82 100  Resp: 18 16  Temp: 36.7 C 36.6 C  SpO2: 100% 98%    Last Pain:  Vitals:   03/01/21 0730  TempSrc: Oral  PainSc: 3                  Valeska Haislip

## 2021-03-01 NOTE — Discharge Summary (Signed)
Physician Discharge Summary  Patient ID: Leonard Ramos MRN: [id number] DOB/AGE: 1956-05-21 65 y.o.  Admit date: 02/28/2021 Discharge date: 03/01/2021  Admission Diagnoses:  Discharge Diagnoses:  Active Problems:   Cervical spondylosis with myelopathy and radiculopathy   Discharged Condition: good  Hospital Course: Patient admitted to the hospital where he underwent uncomplicated 3 level anterior cervical decompression and fusion surgery.  Postop Leonard Ramos doing very well.  Preoperative neck and upper extremity pain improved.  Standing and walking better.  Upper extremities still with some mild numbness in his hands but strength is better.  Swallowing well.  Ambulating with minimal difficulty.  Voiding well.  Ready for discharge home.  Consults:   Significant Diagnostic Studies:   Treatments:   Discharge Exam: Blood pressure 132/90, pulse 100, temperature 97.9 F (36.6 C), temperature source Oral, resp. rate 16, height 5' 11.5" (1.816 m), weight 62.6 kg, SpO2 98 %. Awake and alert.  Oriented and appropriate.  Motor and sensory function intact except for some mild distal sensory loss in both upper extremities.  Wound clean and dry.  Chest and abdomen benign.  Disposition: Discharge disposition: 01-Home or Self Care        Allergies as of 03/01/2021   No Known Allergies      Medication List     TAKE these medications    aspirin 81 MG chewable tablet Chew 81 mg by mouth daily.   busPIRone 10 MG tablet Commonly known as: BUSPAR TAKE 1 TABLET (10 MG TOTAL) BY MOUTH 2 (TWO) TIMES DAILY.   chlorhexidine 0.12 % solution Commonly known as: Peridex Use as directed 15 mLs in the mouth or throat daily.   cholecalciferol 25 MCG (1000 UNIT) tablet Commonly known as: VITAMIN D3 Take 1,000 Units by mouth daily.   cyclobenzaprine 10 MG tablet Commonly known as: FLEXERIL Take 1 tablet (10 mg total) by mouth 3 (three) times daily as needed for muscle spasms.   FLUoxetine  40 MG capsule Commonly known as: PROZAC Take 1 capsule (40 mg total) by mouth every other day for 7 days, THEN 1 capsule (40 mg total) every 36 (thirty-six) hours for 7 days, THEN 1 capsule (40 mg total) once a week for 7 days. Start taking on: November 30, 2020   folic acid 1 MG tablet Commonly known as: FOLVITE Take 1 tablet (1 mg total) by mouth daily.   gabapentin 600 MG tablet Commonly known as: NEURONTIN Take 1 tablet (600 mg total) by mouth 3 (three) times daily.   HYDROcodone-acetaminophen 5-325 MG tablet Commonly known as: NORCO/VICODIN Take 1 tablet by mouth every 4 (four) hours as needed for moderate pain ((score 4 to 6)).   LUBRICATING EYE DROPS OP Place 1 drop into both eyes daily.   omeprazole 20 MG capsule Commonly known as: PRILOSEC Take 1 capsule (20 mg total) by mouth daily.   ondansetron 4 MG disintegrating tablet Commonly known as: Zofran ODT Take 1 tablet (4 mg total) by mouth every 8 (eight) hours as needed for nausea or vomiting.   pravastatin 20 MG tablet Commonly known as: PRAVACHOL TAKE 1 TABLET (20 MG TOTAL) BY MOUTH DAILY.   sildenafil 100 MG tablet Commonly known as: VIAGRA Take 1 tablet (100 mg total) by mouth daily as needed for erectile dysfunction.   thiamine 100 MG tablet Commonly known as: Vitamin B-1 Take 1 tablet (100 mg total) by mouth daily.         Signed: Kathaleen Maser [name] 03/01/2021, 9:05 AM

## 2021-03-01 NOTE — Plan of Care (Signed)
  Problem: Education: Goal: Ability to verbalize activity precautions or restrictions will improve Outcome: Completed/Met Goal: Knowledge of the prescribed therapeutic regimen will improve Outcome: Completed/Met Goal: Understanding of discharge needs will improve Outcome: Completed/Met   Problem: Bowel/Gastric: Goal: Gastrointestinal status for postoperative course will improve Outcome: Completed/Met   Problem: Clinical Measurements: Goal: Ability to maintain clinical measurements within normal limits will improve Outcome: Completed/Met Goal: Postoperative complications will be avoided or minimized Outcome: Completed/Met Goal: Diagnostic test results will improve Outcome: Completed/Met   Problem: Pain Management: Goal: Pain level will decrease Outcome: Completed/Met   Problem: Health Behavior/Discharge Planning: Goal: Identification of resources available to assist in meeting health care needs will improve Outcome: Completed/Met   

## 2021-03-01 NOTE — Evaluation (Signed)
Occupational Therapy Evaluation Patient Details Name: Leonard Ramos MRN: [id number] DOB: 1956/02/07 Today's Date: 03/01/2021   History of Present Illness 65 y.o. M admitted on 10/11 for anterior cervical discectomy with interbody fusion from C3-C6. PMH significant for Alcohol use, anxiety, arthritis, HLD, and neuropathy.   Clinical Impression   Pt admitted for procedure listed above. PTA pt reported that he was independent with all ADL's and IADL's, including driving and grocery shopping. At this time, pt continues to demonstrate independence, requiring no assist, only increased time for safety. Overall pt has no further concerns. He was educated on all precautions and compensatory strategies. Pt has no further OT needs at this time and acute OT will sign off.       Recommendations for follow up therapy are one component of a multi-disciplinary discharge planning process, led by the attending physician.  Recommendations may be updated based on patient status, additional functional criteria and insurance authorization.   Follow Up Recommendations  No OT follow up    Equipment Recommendations  None recommended by OT    Recommendations for Other Services       Precautions / Restrictions Precautions Precautions: Cervical Precaution Booklet Issued: Yes (comment) Precaution Comments: Reviewed precautions and educated on compensatory techniques for ADL's Required Braces or Orthoses: Cervical Brace Cervical Brace: Soft collar;At all times Restrictions Weight Bearing Restrictions: No      Mobility Bed Mobility Overal bed mobility: Modified Independent             General bed mobility comments: Completed log roll to sitting with increased time    Transfers Overall transfer level: Independent Equipment used: None             General transfer comment: Multiple sit<>stands with no assist needed.    Balance Overall balance assessment: No apparent balance deficits (not  formally assessed)                                         ADL either performed or assessed with clinical judgement   ADL Overall ADL's : Modified independent                                       General ADL Comments: Pt requiring increased time, however he is able to complete all ADL's following his precations. Needs some education on compensatory techniques.     Vision Baseline Vision/History: 0 No visual deficits Ability to See in Adequate Light: 1 Impaired Patient Visual Report: No change from baseline Vision Assessment?: No apparent visual deficits     Perception Perception Perception Tested?: No   Praxis Praxis Praxis tested?: Not tested    Pertinent Vitals/Pain Pain Assessment: No/denies pain     Hand Dominance Right   Extremity/Trunk Assessment Upper Extremity Assessment Upper Extremity Assessment: RUE deficits/detail;LUE deficits/detail RUE Deficits / Details: Pt reports numbness with 3/5 grip strength, 4-/5 elbow flex, and 4/5 shoulder flex. All ROM WFL. RUE Sensation: decreased light touch RUE Coordination: decreased fine motor LUE Deficits / Details: Pt reports numbness with 3/5 grip strength, 4-/5 elbow flex, and 4/5 shoulder flex. All ROM WFL. LUE Sensation: decreased light touch LUE Coordination: decreased fine motor   Lower Extremity Assessment Lower Extremity Assessment: Overall WFL for tasks assessed   Cervical / Trunk Assessment Cervical / Trunk Assessment: Kyphotic  Communication Communication Communication: No difficulties   Cognition Arousal/Alertness: Awake/alert Behavior During Therapy: WFL for tasks assessed/performed Overall Cognitive Status: Within Functional Limits for tasks assessed                                     General Comments  Incision site with limited bandaging on top, no visable bleeding, swelling, or redness.    Exercises     Shoulder Instructions      Home  Living Family/patient expects to be discharged to:: Private residence Living Arrangements: Alone Available Help at Discharge: Family;Available PRN/intermittently Type of Home: Apartment Home Access: Elevator     Home Layout: One level     Bathroom Shower/Tub: Chief Strategy Officer: Standard     Home Equipment: None          Prior Functioning/Environment Level of Independence: Independent        Comments: Pt reports using no DME, completeing all ADL's and IADL's with no assist        OT Problem List: Decreased strength;Decreased activity tolerance;Decreased coordination;Impaired UE functional use      OT Treatment/Interventions:      OT Goals(Current goals can be found in the care plan section) Acute Rehab OT Goals Patient Stated Goal: To go home OT Goal Formulation: All assessment and education complete, DC therapy Time For Goal Achievement: 03/01/21 Potential to Achieve Goals: Good  OT Frequency:     Barriers to D/C:            Co-evaluation              AM-PAC OT "6 Clicks" Daily Activity     Outcome Measure Help from another person eating meals?: None Help from another person taking care of personal grooming?: None Help from another person toileting, which includes using toliet, bedpan, or urinal?: None Help from another person bathing (including washing, rinsing, drying)?: None Help from another person to put on and taking off regular upper body clothing?: None Help from another person to put on and taking off regular lower body clothing?: None 6 Click Score: 24   End of Session Equipment Utilized During Treatment: Cervical collar Nurse Communication: Mobility status  Activity Tolerance: Patient tolerated treatment well Patient left: in bed;with call bell/phone within reach  OT Visit Diagnosis: Unsteadiness on feet (R26.81);Muscle weakness (generalized) (M62.81)                Time: 9937-1696 OT Time Calculation (min): 27  min Charges:  OT General Charges $OT Visit: 1 Visit OT Evaluation $OT Eval Low Complexity: 1 Low OT Treatments $Self Care/Home Management : 8-22 mins  Hoorain Kozakiewicz H., OTR/L Acute Rehabilitation  Khadija Thier Elane Bing Plume 03/01/2021, 9:37 AM

## 2021-03-02 NOTE — Addendum Note (Signed)
Addendum  created 03/02/21 1347 by Bethena Midget, MD   Attestation recorded in Intraprocedure, Intraprocedure Attestations filed

## 2021-03-07 ENCOUNTER — Encounter: Payer: Self-pay | Admitting: Cardiology

## 2021-03-07 ENCOUNTER — Other Ambulatory Visit: Payer: Self-pay

## 2021-03-07 ENCOUNTER — Ambulatory Visit (INDEPENDENT_AMBULATORY_CARE_PROVIDER_SITE_OTHER): Payer: Medicare Other | Admitting: Cardiology

## 2021-03-07 VITALS — BP 128/82 | HR 76 | Ht 71.0 in | Wt 137.8 lb

## 2021-03-07 DIAGNOSIS — E7800 Pure hypercholesterolemia, unspecified: Secondary | ICD-10-CM

## 2021-03-07 DIAGNOSIS — Z720 Tobacco use: Secondary | ICD-10-CM | POA: Diagnosis not present

## 2021-03-07 DIAGNOSIS — I420 Dilated cardiomyopathy: Secondary | ICD-10-CM

## 2021-03-07 NOTE — Patient Instructions (Signed)

## 2021-04-18 ENCOUNTER — Other Ambulatory Visit: Payer: Self-pay

## 2021-04-20 DIAGNOSIS — Z1152 Encounter for screening for COVID-19: Secondary | ICD-10-CM | POA: Diagnosis not present

## 2021-04-27 ENCOUNTER — Other Ambulatory Visit: Payer: Self-pay

## 2021-05-03 ENCOUNTER — Other Ambulatory Visit: Payer: Self-pay

## 2021-05-21 ENCOUNTER — Other Ambulatory Visit: Payer: Self-pay

## 2021-05-21 ENCOUNTER — Emergency Department (HOSPITAL_COMMUNITY): Payer: Medicare Other

## 2021-05-21 ENCOUNTER — Emergency Department (HOSPITAL_COMMUNITY)
Admission: EM | Admit: 2021-05-21 | Discharge: 2021-05-21 | Disposition: A | Discharge status: Home / Self Care | Payer: Medicare Other | Attending: Emergency Medicine | Admitting: Emergency Medicine

## 2021-05-21 ENCOUNTER — Encounter (HOSPITAL_COMMUNITY): Payer: Self-pay | Admitting: Emergency Medicine

## 2021-05-21 DIAGNOSIS — Z7982 Long term (current) use of aspirin: Secondary | ICD-10-CM | POA: Insufficient documentation

## 2021-05-21 DIAGNOSIS — M25551 Pain in right hip: Secondary | ICD-10-CM | POA: Insufficient documentation

## 2021-05-21 DIAGNOSIS — W19XXXA Unspecified fall, initial encounter: Secondary | ICD-10-CM

## 2021-05-21 DIAGNOSIS — W1839XA Other fall on same level, initial encounter: Secondary | ICD-10-CM | POA: Insufficient documentation

## 2021-05-21 DIAGNOSIS — M25552 Pain in left hip: Secondary | ICD-10-CM | POA: Diagnosis not present

## 2021-05-21 DIAGNOSIS — M25559 Pain in unspecified hip: Secondary | ICD-10-CM

## 2021-05-21 NOTE — ED Notes (Signed)
Vallarie Mare (Mother) called asking if he can call her at home. [phone]

## 2021-05-21 NOTE — ED Notes (Signed)
Pt returned from CT °

## 2021-05-21 NOTE — ED Notes (Signed)
Pt refused v/s . Pt wants to sleep

## 2021-05-21 NOTE — ED Notes (Signed)
Pt transported to radiology.

## 2021-05-21 NOTE — ED Provider Notes (Signed)
7:21 AM Care assumed from Dr. Dayna Barker.  At time of transfer care, patient is metabolizing EtOH.  Patient was not very forthcoming with previous team so plan of care is to reassess to determine if he needs any work-up from possible fall injuries but if not and patient is able to appear stable, he will likely be safe for discharge home.  8:43 AM Patient is starting to wake up.  He is saying that he thinks he fell last night but does not fully member but he does not think he hit his head.  He is not having headache or neck pain but is complaining of some what sounds to be acute on chronic pain in his lumbar back and his bilateral hips and pelvis area.  He reports that has been hurting for quite some time but thinks it may be hurting a little worse today.  He did want some x-rays of the.  On my exam he did have intact sensation and strength and pulses in lower extremities and had some tenderness in both hips, pelvis area, and his lumbar spine both in the midline and paraspinally.  We will get x-rays of the lumbar spine and bilateral hip/pelvis.  Patient agrees that if work-up does not show any concerning findings, plan of care will be to discharge to have him follow-up with his PCP and back doctor for further chronic management.  CT scan revealed no fracture.  Patient is now walking around without complaints and was to go home.  Patient will be discharged.  Clinical Impression: 1. Fall, initial encounter   2. Hip pain     Disposition: Discharge  Condition: Good  I have discussed the results, Dx and Tx plan with the pt(& family if present). He/she/they expressed understanding and agree(s) with the plan. Discharge instructions discussed at great length. Strict return precautions discussed and pt &/or family have verbalized understanding of the instructions. No further questions at time of discharge.    Discharge Medication List as of 05/21/2021  1:39 PM      Follow Up: [facility] [place] [phone] Schedule an appointment as soon as possible for a visit    [facility] [address] [place]       Demetria Iwai, Gwenyth Allegra, MD 05/21/21 [phone]

## 2021-05-21 NOTE — ED Provider Notes (Signed)
[facility] EMERGENCY DEPARTMENT Provider Note   CSN: [id number] Arrival date & time: 05/21/21  [id number]     History  Chief Complaint  Patient presents with   Fall   Alcohol Intoxication    Leonard Ramos is a 66 y.o. male.  Reportedly intoxicated and fell. Can't get a clear history. Has ambulated somewhat so apparently has neuro ability.  Gets upset easy. Won't give history.    Fall  Alcohol Intoxication      Home Medications Prior to Admission medications   Medication Sig Start Date End Date Taking? Authorizing Provider  aspirin 81 MG chewable tablet Chew 81 mg by mouth daily.    [provider]  Carboxymethylcellul-Glycerin (LUBRICATING EYE DROPS OP) Place 1 drop into both eyes daily.    [provider]  chlorhexidine (PERIDEX) 0.12 % solution Use as directed 15 mLs in the mouth or throat daily. 11/30/20   Vevelyn Francois, NP  cholecalciferol (VITAMIN D3) 25 MCG (1000 UNIT) tablet Take 1,000 Units by mouth daily.    [provider]  cyclobenzaprine (FLEXERIL) 10 MG tablet Take 1 tablet (10 mg total) by mouth 3 (three) times daily as needed for muscle spasms. 03/01/21   Earnie Larsson, MD  FLUoxetine (PROZAC) 40 MG capsule Take 1 capsule (40 mg total) by mouth every other day for 7 days, THEN 1 capsule (40 mg total) every 36 (thirty-six) hours for 7 days, THEN 1 capsule (40 mg total) once a week for 7 days. 11/30/20 03/07/21  Vevelyn Francois, NP  folic acid (FOLVITE) 1 MG tablet Take 1 tablet (1 mg total) by mouth daily. 04/18/20   Azzie Glatter, FNP  gabapentin (NEURONTIN) 600 MG tablet Take 1 tablet (600 mg total) by mouth 3 (three) times daily. 11/30/20 11/30/21  Vevelyn Francois, NP  HYDROcodone-acetaminophen (NORCO/VICODIN) 5-325 MG tablet Take 1 tablet by mouth every 4 (four) hours as needed for moderate pain ((score 4 to 6)). 03/01/21   Earnie Larsson, MD  omeprazole (PRILOSEC) 20 MG capsule Take 1 capsule (20 mg total) by mouth  daily. 02/25/21   Isla Pence, MD  ondansetron (ZOFRAN ODT) 4 MG disintegrating tablet Take 1 tablet (4 mg total) by mouth every 8 (eight) hours as needed for nausea or vomiting. 02/25/21   Isla Pence, MD  pravastatin (PRAVACHOL) 20 MG tablet TAKE 1 TABLET (20 MG TOTAL) BY MOUTH DAILY. 04/18/20 04/18/21  Azzie Glatter, FNP  sildenafil (VIAGRA) 100 MG tablet Take 1 tablet (100 mg total) by mouth daily as needed for erectile dysfunction. 04/18/20   Azzie Glatter, FNP  thiamine (VITAMIN B-1) 100 MG tablet Take 1 tablet (100 mg total) by mouth daily. 04/29/19   Azzie Glatter, FNP      Allergies    Patient has no known allergies.    Review of Systems   Review of Systems  Unable to perform ROS: Other   Physical Exam Updated Vital Signs BP 134/89    Pulse 91    Temp 97.6 F (36.4 C) (Oral)    Resp 18    SpO2 96%  Physical Exam Vitals and nursing note reviewed.  Constitutional:      Appearance: He is well-developed.  HENT:     Head: Normocephalic and atraumatic.     Mouth/Throat:     Mouth: Mucous membranes are moist.     Pharynx: Oropharynx is clear.  Eyes:     Pupils: Pupils are equal, round, and reactive to light.  Cardiovascular:     Rate and Rhythm: Normal rate.  Pulmonary:     Effort: Pulmonary effort is normal. No respiratory distress.  Abdominal:     General: Abdomen is flat. There is no distension.  Musculoskeletal:        General: No swelling or tenderness. Normal range of motion.     Cervical back: Normal range of motion.  Skin:    General: Skin is warm and dry.  Neurological:     Mental Status: He is alert. He is disoriented.    ED Results / Procedures / Treatments   Labs (all labs ordered are listed, but only abnormal results are displayed) Labs Reviewed - No data to display  EKG None  Radiology No results found.  Procedures Procedures    Medications Ordered in ED Medications - No data to display  ED Course/ Medical Decision Making/  A&P                           Medical Decision Making  Difficult to obtain history secondary to intoxication. Will allow to sober up for further history/workup as needed. No obvious severe head trauma to warrant head CT at this time.     Final Clinical Impression(s) / ED Diagnoses Final diagnoses:  None    Rx / DC Orders ED Discharge Orders     None         Andrey Hoobler, Corene Cornea, MD 05/22/21 [phone]

## 2021-05-21 NOTE — ED Provider Notes (Incomplete)
[facility] EMERGENCY DEPARTMENT Provider Note   CSN: [id number] Arrival date & time: 05/21/21  0137     History {Add pertinent medical, surgical, social history, OB history to HPI:1} Chief Complaint  Patient presents with   Fall   Alcohol Intoxication    Leonard Ramos is a 66 y.o. male.   Fall  Alcohol Intoxication      Home Medications Prior to Admission medications   Medication Sig Start Date End Date Taking? Authorizing Provider  aspirin 81 MG chewable tablet Chew 81 mg by mouth daily.    [provider]  Carboxymethylcellul-Glycerin (LUBRICATING EYE DROPS OP) Place 1 drop into both eyes daily.    [provider]  chlorhexidine (PERIDEX) 0.12 % solution Use as directed 15 mLs in the mouth or throat daily. 11/30/20   Barbette Merino, NP  cholecalciferol (VITAMIN D3) 25 MCG (1000 UNIT) tablet Take 1,000 Units by mouth daily.    [provider]  cyclobenzaprine (FLEXERIL) 10 MG tablet Take 1 tablet (10 mg total) by mouth 3 (three) times daily as needed for muscle spasms. 03/01/21   Julio Sicks, MD  FLUoxetine (PROZAC) 40 MG capsule Take 1 capsule (40 mg total) by mouth every other day for 7 days, THEN 1 capsule (40 mg total) every 36 (thirty-six) hours for 7 days, THEN 1 capsule (40 mg total) once a week for 7 days. 11/30/20 03/07/21  Barbette Merino, NP  folic acid (FOLVITE) 1 MG tablet Take 1 tablet (1 mg total) by mouth daily. 04/18/20   Kallie Locks, FNP  gabapentin (NEURONTIN) 600 MG tablet Take 1 tablet (600 mg total) by mouth 3 (three) times daily. 11/30/20 11/30/21  Barbette Merino, NP  HYDROcodone-acetaminophen (NORCO/VICODIN) 5-325 MG tablet Take 1 tablet by mouth every 4 (four) hours as needed for moderate pain ((score 4 to 6)). 03/01/21   Julio Sicks, MD  omeprazole (PRILOSEC) 20 MG capsule Take 1 capsule (20 mg total) by mouth daily. 02/25/21   Jacalyn Lefevre, MD  ondansetron (ZOFRAN ODT) 4 MG disintegrating tablet  Take 1 tablet (4 mg total) by mouth every 8 (eight) hours as needed for nausea or vomiting. 02/25/21   Jacalyn Lefevre, MD  pravastatin (PRAVACHOL) 20 MG tablet TAKE 1 TABLET (20 MG TOTAL) BY MOUTH DAILY. 04/18/20 04/18/21  Kallie Locks, FNP  sildenafil (VIAGRA) 100 MG tablet Take 1 tablet (100 mg total) by mouth daily as needed for erectile dysfunction. 04/18/20   Kallie Locks, FNP  thiamine (VITAMIN B-1) 100 MG tablet Take 1 tablet (100 mg total) by mouth daily. 04/29/19   Kallie Locks, FNP      Allergies    Patient has no known allergies.    Review of Systems   Review of Systems  Physical Exam Updated Vital Signs BP 134/89    Pulse 91    Temp 97.6 F (36.4 C) (Oral)    Resp 18    SpO2 96%  Physical Exam  ED Results / Procedures / Treatments   Labs (all labs ordered are listed, but only abnormal results are displayed) Labs Reviewed - No data to display  EKG None  Radiology No results found.  Procedures Procedures  {Document cardiac monitor, telemetry assessment procedure when appropriate:1}  Medications Ordered in ED Medications - No data to display  ED Course/ Medical Decision Making/ A&P  Medical Decision Making  ***  {Document critical care time when appropriate:1} {Document review of labs and clinical decision tools ie heart score, Chads2Vasc2 etc:1}  {Document your independent review of radiology images, and any outside records:1} {Document your discussion with family members, caretakers, and with consultants:1} {Document social determinants of health affecting pt's care:1} {Document your decision making why or why not admission, treatments were needed:1} Final Clinical Impression(s) / ED Diagnoses Final diagnoses:  None    Rx / DC Orders ED Discharge Orders     None

## 2021-05-21 NOTE — ED Triage Notes (Signed)
Pt BIB [facility], found lying on the floor, bruise to right eye. +ETOH. EMS VS: BP 131/88, HR 98, SpO2 95% room air, CBG 113

## 2021-05-21 NOTE — Discharge Instructions (Signed)
Your work-up today did not show evidence of any hip fracture on the CT.  I feel you are safe for discharge home.  Please rest and stay hydrated and follow with her primary doctor.  If any symptoms change or worsen, please return to the nearest emergency department.

## 2021-08-02 ENCOUNTER — Other Ambulatory Visit: Payer: Self-pay

## 2021-08-04 ENCOUNTER — Other Ambulatory Visit: Payer: Self-pay

## 2021-10-24 ENCOUNTER — Other Ambulatory Visit: Payer: Self-pay

## 2021-10-27 ENCOUNTER — Other Ambulatory Visit: Payer: Self-pay

## 2021-12-28 ENCOUNTER — Other Ambulatory Visit: Payer: Self-pay

## 2021-12-28 ENCOUNTER — Encounter: Payer: Self-pay | Admitting: Nurse Practitioner

## 2021-12-28 ENCOUNTER — Ambulatory Visit (INDEPENDENT_AMBULATORY_CARE_PROVIDER_SITE_OTHER): Payer: Medicare Other | Admitting: Nurse Practitioner

## 2021-12-28 VITALS — BP 123/81 | HR 93 | Temp 97.5°F | Ht 71.0 in | Wt 138.0 lb

## 2021-12-28 DIAGNOSIS — M79605 Pain in left leg: Secondary | ICD-10-CM

## 2021-12-28 DIAGNOSIS — M545 Low back pain, unspecified: Secondary | ICD-10-CM | POA: Diagnosis not present

## 2021-12-28 DIAGNOSIS — G8929 Other chronic pain: Secondary | ICD-10-CM

## 2021-12-28 DIAGNOSIS — M79604 Pain in right leg: Secondary | ICD-10-CM | POA: Diagnosis not present

## 2021-12-28 DIAGNOSIS — G629 Polyneuropathy, unspecified: Secondary | ICD-10-CM

## 2021-12-28 DIAGNOSIS — I700 Atherosclerosis of aorta: Secondary | ICD-10-CM | POA: Diagnosis not present

## 2021-12-28 DIAGNOSIS — Z760 Encounter for issue of repeat prescription: Secondary | ICD-10-CM

## 2021-12-28 MED ORDER — PRAVASTATIN SODIUM 20 MG PO TABS
ORAL_TABLET | Freq: Every day | ORAL | 3 refills | Status: DC
  Filled 2021-12-28: qty 90, 90d supply, fill #0

## 2021-12-28 MED ORDER — PREDNISONE 20 MG PO TABS
20.0000 mg | ORAL_TABLET | Freq: Every day | ORAL | 0 refills | Status: AC
  Filled 2021-12-28: qty 5, 5d supply, fill #0

## 2021-12-28 MED ORDER — OMEPRAZOLE 20 MG PO CPDR
20.0000 mg | DELAYED_RELEASE_CAPSULE | Freq: Every day | ORAL | 0 refills | Status: DC
  Filled 2021-12-28: qty 30, 30d supply, fill #0

## 2021-12-28 MED ORDER — KETOROLAC TROMETHAMINE 30 MG/ML IJ SOLN
30.0000 mg | Freq: Once | INTRAMUSCULAR | Status: AC
  Administered 2021-12-28: 30 mg via INTRAMUSCULAR

## 2021-12-28 MED ORDER — GABAPENTIN 600 MG PO TABS
600.0000 mg | ORAL_TABLET | Freq: Three times a day (TID) | ORAL | 3 refills | Status: DC
  Filled 2021-12-28 – 2022-01-26 (×3): qty 270, 90d supply, fill #0

## 2021-12-28 NOTE — Progress Notes (Signed)
@Patient  ID: , male    DOB: [birth year], 66 y.o.   MRN: [id number]  Chief Complaint  Patient presents with   Pain    Pt states he has pain all over his body.     Referring provider: No ref. provider found   HPI  66 year old male with past medical history of Alcohol use, Anxiety, Hyperlipidemia, Liver disease, Neuropathy, Periodontitis, chronic, generalized, Tobacco use, and Vitamin D deficiency (03/2019).    Patient presents today with pain to lower extremities.  He states that he did fall yesterday while bringing groceries.  He does have a history of chronic bilateral leg pain and low back pain.  Patient has not been seen at this office in over a year.  He is out of maintenance medications.  We we will refill Neurontin for him today.  We will give patient Toradol shot today.  We will check blood work.  Assessment there is no appear to be any obvious injuries. Denies f/c/s, n/v/d, hemoptysis, PND, leg swelling Denies chest pain or edema    No Known Allergies  Immunization History  Administered Date(s) Administered   Influenza,inj,Quad PF,6+ Mos 03/08/2015, 03/08/2016, 03/06/2017, 03/07/2018, 04/10/2019   Moderna Sars-Covid-2 Vaccination 09/28/2019   Pneumococcal Polysaccharide-23 03/08/2015   Tdap 03/08/2016    Past Medical History:  Diagnosis Date   Alcohol use    Anxiety    Arthritis    Hyperlipidemia    Liver disease    Neuropathy    Periodontitis, chronic, generalized    Tobacco use    Vitamin D deficiency 03/2019    Tobacco History: Social History   Tobacco Use  Smoking Status Every Day   Packs/day: 1.00   Types: Cigarettes  Smokeless Tobacco Never   Ready to quit: Not Answered Counseling given: Not Answered   Outpatient Encounter Medications as of 12/28/2021  Medication Sig   predniSONE (DELTASONE) 20 MG tablet Take 1 tablet (20 mg total) by mouth daily with breakfast for 5 days.   aspirin 81 MG chewable tablet Chew 81 mg by mouth daily.    Carboxymethylcellul-Glycerin (LUBRICATING EYE DROPS OP) Place 1 drop into both eyes daily.   chlorhexidine (PERIDEX) 0.12 % solution Use as directed 15 mLs in the mouth or throat daily.   cholecalciferol (VITAMIN D3) 25 MCG (1000 UNIT) tablet Take 1,000 Units by mouth daily.   cyclobenzaprine (FLEXERIL) 10 MG tablet Take 1 tablet (10 mg total) by mouth 3 (three) times daily as needed for muscle spasms.   FLUoxetine (PROZAC) 40 MG capsule Take 1 capsule (40 mg total) by mouth every other day for 7 days, THEN 1 capsule (40 mg total) every 36 (thirty-six) hours for 7 days, THEN 1 capsule (40 mg total) once a week for 7 days.   folic acid (FOLVITE) 1 MG tablet Take 1 tablet (1 mg total) by mouth daily.   gabapentin (NEURONTIN) 600 MG tablet Take 1 tablet (600 mg total) by mouth 3 (three) times daily.   HYDROcodone-acetaminophen (NORCO/VICODIN) 5-325 MG tablet Take 1 tablet by mouth every 4 (four) hours as needed for moderate pain ((score 4 to 6)).   omeprazole (PRILOSEC) 20 MG capsule Take 1 capsule (20 mg total) by mouth daily.   ondansetron (ZOFRAN ODT) 4 MG disintegrating tablet Take 1 tablet (4 mg total) by mouth every 8 (eight) hours as needed for nausea or vomiting.   pravastatin (PRAVACHOL) 20 MG tablet TAKE 1 TABLET (20 MG TOTAL) BY MOUTH DAILY.   sildenafil (VIAGRA) 100  MG tablet Take 1 tablet (100 mg total) by mouth daily as needed for erectile dysfunction.   thiamine (VITAMIN B-1) 100 MG tablet Take 1 tablet (100 mg total) by mouth daily.   [DISCONTINUED] gabapentin (NEURONTIN) 600 MG tablet Take 1 tablet (600 mg total) by mouth 3 (three) times daily.   [DISCONTINUED] omeprazole (PRILOSEC) 20 MG capsule Take 1 capsule (20 mg total) by mouth daily.   [DISCONTINUED] pravastatin (PRAVACHOL) 20 MG tablet TAKE 1 TABLET (20 MG TOTAL) BY MOUTH DAILY.   [EXPIRED] ketorolac (TORADOL) 30 MG/ML injection 30 mg    No facility-administered encounter medications on file as of 12/28/2021.     Review  of Systems  Review of Systems  Constitutional: Negative.   HENT: Negative.    Cardiovascular: Negative.   Gastrointestinal: Negative.   Musculoskeletal:  Positive for back pain.       Bilateral leg and knee pain  Allergic/Immunologic: Negative.   Neurological: Negative.   Psychiatric/Behavioral: Negative.         Physical Exam  BP 123/81 (BP Location: Right Arm, Patient Position: Sitting, Cuff Size: Normal)   Pulse 93   Temp (!) 97.5 F (36.4 C)   Ht 5\' 11"  (1.803 m)   Wt 138 lb (62.6 kg)   SpO2 100%   BMI 19.25 kg/m   Wt Readings from Last 5 Encounters:  12/28/21 138 lb (62.6 kg)  03/07/21 137 lb 12.8 oz (62.5 kg)  02/28/21 138 lb (62.6 kg)  02/16/21 138 lb 12.8 oz (63 kg)  12/06/20 134 lb (60.8 kg)     Physical Exam Vitals and nursing note reviewed.  Constitutional:      General: He is not in acute distress.    Appearance: He is well-developed.  Cardiovascular:     Rate and Rhythm: Normal rate and regular rhythm.  Pulmonary:     Effort: Pulmonary effort is normal.     Breath sounds: Normal breath sounds.  Skin:    General: Skin is warm and dry.  Neurological:     Mental Status: He is alert and oriented to person, place, and time.      Lab Results:  CBC    Component Value Date/Time   WBC 4.7 12/28/2021 1628   WBC 4.7 02/25/2021 1748   RBC 5.11 12/28/2021 1628   RBC 5.13 02/25/2021 1748   HGB 16.4 12/28/2021 1628   HCT 47.5 12/28/2021 1628   PLT 239 12/28/2021 1628   MCV 93 12/28/2021 1628   MCH 32.1 12/28/2021 1628   MCH 31.8 02/25/2021 1748   MCHC 34.5 12/28/2021 1628   MCHC 33.5 02/25/2021 1748   RDW 13.4 12/28/2021 1628   LYMPHSABS 1.6 02/25/2021 1748   LYMPHSABS 0.9 04/18/2020 1007   MONOABS 0.6 02/25/2021 1748   EOSABS 0.1 02/25/2021 1748   EOSABS 0.0 04/18/2020 1007   BASOSABS 0.0 02/25/2021 1748   BASOSABS 0.0 04/18/2020 1007    BMET    Component Value Date/Time   NA 142 12/28/2021 1628   K 4.5 12/28/2021 1628   CL 104  12/28/2021 1628   CO2 21 12/28/2021 1628   GLUCOSE 80 12/28/2021 1628   GLUCOSE 89 02/25/2021 1748   BUN 5 (L) 12/28/2021 1628   CREATININE 0.73 (L) 12/28/2021 1628   CREATININE 0.92 03/06/2017 1023   CALCIUM 9.2 12/28/2021 1628   GFRNONAA >60 02/25/2021 1748   GFRNONAA 91 03/06/2017 1023   GFRAA 100 04/18/2020 1007   GFRAA 105 03/06/2017 1023    BNP No results  found for: "BNP"  ProBNP No results found for: "PROBNP"  Imaging: No results found.   Assessment & Plan:   Neuropathy (HCC) - gabapentin (NEURONTIN) 600 MG tablet; Take 1 tablet (600 mg total) by mouth 3 (three) times daily.  Dispense: 270 tablet; Refill: 3  2. Chronic low back pain, unspecified back pain laterality, unspecified whether sciatica present  - gabapentin (NEURONTIN) 600 MG tablet; Take 1 tablet (600 mg total) by mouth 3 (three) times daily.  Dispense: 270 tablet; Refill: 3 - CBC - Comprehensive metabolic panel - predniSONE (DELTASONE) 20 MG tablet; Take 1 tablet (20 mg total) by mouth daily with breakfast for 5 days.  Dispense: 5 tablet; Refill: 0  3. Chronic pain of both lower extremities  - gabapentin (NEURONTIN) 600 MG tablet; Take 1 tablet (600 mg total) by mouth 3 (three) times daily.  Dispense: 270 tablet; Refill: 3 - CBC - Comprehensive metabolic panel - predniSONE (DELTASONE) 20 MG tablet; Take 1 tablet (20 mg total) by mouth daily with breakfast for 5 days.  Dispense: 5 tablet; Refill: 0  4. Aortic atherosclerosis (HCC)  - pravastatin (PRAVACHOL) 20 MG tablet; TAKE 1 TABLET (20 MG TOTAL) BY MOUTH DAILY.  Dispense: 90 tablet; Refill: 3  5. Medication refill  - pravastatin (PRAVACHOL) 20 MG tablet; TAKE 1 TABLET (20 MG TOTAL) BY MOUTH DAILY.  Dispense: 90 tablet; Refill: 3    Follow up:  Follow up in 1 month physical and pain     Ivonne Andrew, NP 12/29/2021

## 2021-12-28 NOTE — Patient Instructions (Addendum)
1. Neuropathy  - gabapentin (NEURONTIN) 600 MG tablet; Take 1 tablet (600 mg total) by mouth 3 (three) times daily.  Dispense: 270 tablet; Refill: 3  2. Chronic low back pain, unspecified back pain laterality, unspecified whether sciatica present  - gabapentin (NEURONTIN) 600 MG tablet; Take 1 tablet (600 mg total) by mouth 3 (three) times daily.  Dispense: 270 tablet; Refill: 3 - CBC - Comprehensive metabolic panel - predniSONE (DELTASONE) 20 MG tablet; Take 1 tablet (20 mg total) by mouth daily with breakfast for 5 days.  Dispense: 5 tablet; Refill: 0  3. Chronic pain of both lower extremities  - gabapentin (NEURONTIN) 600 MG tablet; Take 1 tablet (600 mg total) by mouth 3 (three) times daily.  Dispense: 270 tablet; Refill: 3 - CBC - Comprehensive metabolic panel - predniSONE (DELTASONE) 20 MG tablet; Take 1 tablet (20 mg total) by mouth daily with breakfast for 5 days.  Dispense: 5 tablet; Refill: 0  4. Aortic atherosclerosis (HCC)  - pravastatin (PRAVACHOL) 20 MG tablet; TAKE 1 TABLET (20 MG TOTAL) BY MOUTH DAILY.  Dispense: 90 tablet; Refill: 3  5. Medication refill  - pravastatin (PRAVACHOL) 20 MG tablet; TAKE 1 TABLET (20 MG TOTAL) BY MOUTH DAILY.  Dispense: 90 tablet; Refill: 3    Follow up:  Follow up in 1 month physical and pain

## 2021-12-29 ENCOUNTER — Encounter: Payer: Self-pay | Admitting: Nurse Practitioner

## 2021-12-29 LAB — COMPREHENSIVE METABOLIC PANEL
ALT: 30 IU/L (ref 0–44)
AST: 43 IU/L — ABNORMAL HIGH (ref 0–40)
Albumin/Globulin Ratio: 2 (ref 1.2–2.2)
Albumin: 4.3 g/dL (ref 3.9–4.9)
Alkaline Phosphatase: 84 IU/L (ref 44–121)
BUN/Creatinine Ratio: 7 — ABNORMAL LOW (ref 10–24)
BUN: 5 mg/dL — ABNORMAL LOW (ref 8–27)
Bilirubin Total: <0.2 mg/dL (ref 0.0–1.2)
CO2: 21 mmol/L (ref 20–29)
Calcium: 9.2 mg/dL (ref 8.6–10.2)
Chloride: 104 mmol/L (ref 96–106)
Creatinine, Ser: 0.73 mg/dL — ABNORMAL LOW (ref 0.76–1.27)
Globulin, Total: 2.2 g/dL (ref 1.5–4.5)
Glucose: 80 mg/dL (ref 70–99)
Potassium: 4.5 mmol/L (ref 3.5–5.2)
Sodium: 142 mmol/L (ref 134–144)
Total Protein: 6.5 g/dL (ref 6.0–8.5)
eGFR: 102 mL/min/{1.73_m2} (ref >59)

## 2021-12-29 LAB — CBC
Hematocrit: 47.5 % (ref 37.5–51.0)
Hemoglobin: 16.4 g/dL (ref 13.0–17.7)
MCH: 32.1 pg (ref 26.6–33.0)
MCHC: 34.5 g/dL (ref 31.5–35.7)
MCV: 93 fL (ref 79–97)
Platelets: 239 10*3/uL (ref 150–450)
RBC: 5.11 x10E6/uL (ref 4.14–5.80)
RDW: 13.4 % (ref 11.6–15.4)
WBC: 4.7 10*3/uL (ref 3.4–10.8)

## 2021-12-29 NOTE — Assessment & Plan Note (Signed)
-   gabapentin (NEURONTIN) 600 MG tablet; Take 1 tablet (600 mg total) by mouth 3 (three) times daily.  Dispense: 270 tablet; Refill: 3  2. Chronic low back pain, unspecified back pain laterality, unspecified whether sciatica present  - gabapentin (NEURONTIN) 600 MG tablet; Take 1 tablet (600 mg total) by mouth 3 (three) times daily.  Dispense: 270 tablet; Refill: 3 - CBC - Comprehensive metabolic panel - predniSONE (DELTASONE) 20 MG tablet; Take 1 tablet (20 mg total) by mouth daily with breakfast for 5 days.  Dispense: 5 tablet; Refill: 0  3. Chronic pain of both lower extremities  - gabapentin (NEURONTIN) 600 MG tablet; Take 1 tablet (600 mg total) by mouth 3 (three) times daily.  Dispense: 270 tablet; Refill: 3 - CBC - Comprehensive metabolic panel - predniSONE (DELTASONE) 20 MG tablet; Take 1 tablet (20 mg total) by mouth daily with breakfast for 5 days.  Dispense: 5 tablet; Refill: 0  4. Aortic atherosclerosis (HCC)  - pravastatin (PRAVACHOL) 20 MG tablet; TAKE 1 TABLET (20 MG TOTAL) BY MOUTH DAILY.  Dispense: 90 tablet; Refill: 3  5. Medication refill  - pravastatin (PRAVACHOL) 20 MG tablet; TAKE 1 TABLET (20 MG TOTAL) BY MOUTH DAILY.  Dispense: 90 tablet; Refill: 3    Follow up:  Follow up in 1 month physical and pain

## 2022-01-17 ENCOUNTER — Other Ambulatory Visit: Payer: Self-pay

## 2022-01-24 ENCOUNTER — Other Ambulatory Visit: Payer: Self-pay

## 2022-01-26 ENCOUNTER — Encounter: Payer: Self-pay | Admitting: Nurse Practitioner

## 2022-01-26 ENCOUNTER — Ambulatory Visit (INDEPENDENT_AMBULATORY_CARE_PROVIDER_SITE_OTHER): Payer: Medicare Other | Admitting: Nurse Practitioner

## 2022-01-26 ENCOUNTER — Other Ambulatory Visit: Payer: Self-pay

## 2022-01-26 VITALS — BP 112/86 | HR 96 | Temp 97.3°F | Ht 71.0 in | Wt 133.8 lb

## 2022-01-26 DIAGNOSIS — R2689 Other abnormalities of gait and mobility: Secondary | ICD-10-CM

## 2022-01-26 DIAGNOSIS — W19XXXA Unspecified fall, initial encounter: Secondary | ICD-10-CM

## 2022-01-26 DIAGNOSIS — G4700 Insomnia, unspecified: Secondary | ICD-10-CM

## 2022-01-26 DIAGNOSIS — M4307 Spondylolysis, lumbosacral region: Secondary | ICD-10-CM | POA: Diagnosis not present

## 2022-01-26 MED ORDER — TRAZODONE HCL 50 MG PO TABS
25.0000 mg | ORAL_TABLET | Freq: Every evening | ORAL | 3 refills | Status: DC | PRN
  Filled 2022-01-26: qty 30, 30d supply, fill #0

## 2022-01-26 NOTE — Assessment & Plan Note (Signed)
-   Ambulatory referral to Neurosurgery - Ambulatory referral to Home Health  2. Fall, initial encounter  - Ambulatory referral to Home Health  3. Decreased mobility  - Ambulatory referral to Home Health  Follow up:  Follow up in 3 months or sooner if needed

## 2022-01-26 NOTE — Progress Notes (Signed)
@Patient  ID: , male    DOB: [birth year], 66 y.o.   MRN: [id number]  Chief Complaint  Patient presents with   Fall    Pt reports frequent falls d/t numbness in BLE; requesting referral to Neuro    Referring provider: No ref. provider found   HPI  66 year old male with past medical history of Alcohol use, Anxiety, Hyperlipidemia, Liver disease, Neuropathy, Periodontitis, chronic, generalized, Tobacco use, and Vitamin D deficiency (03/2019).   Patient presents today for follow-up on falls.  Patient states that he has had frequent falls since he was seen here a month ago.  He states that he is having numbness to his both lower extremities.  He has been seen in the past by Dr. 04/2019 with neurosurgery for cervical surgery.  We will refer him back to neurosurgery for follow-up with numbness to the legs.  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  This has been an issue for several years now patient has been referred to neurosurgery for this in the past.  We will order home health nurse and PT eval for recent falls. Denies f/c/s, n/v/d, hemoptysis, PND, leg swelling Denies chest pain or edema       No Known Allergies  Immunization History  Administered Date(s) Administered   Influenza,inj,Quad PF,6+ Mos 03/08/2015, 03/08/2016, 03/06/2017, 03/07/2018, 04/10/2019   Moderna Sars-Covid-2 Vaccination 09/28/2019   Pneumococcal Polysaccharide-23 03/08/2015   Tdap 03/08/2016    Past Medical History:  Diagnosis Date   Alcohol use    Anxiety    Arthritis    Hyperlipidemia    Liver disease    Neuropathy    Periodontitis, chronic, generalized    Tobacco use    Vitamin D deficiency 03/2019    Tobacco History: Social History   Tobacco Use  Smoking Status Every Day   Packs/day: 1.00   Types: Cigarettes  Smokeless Tobacco Never   Ready to quit: Not Answered Counseling given: Not Answered   Outpatient Encounter Medications as of 01/26/2022  Medication  Sig   aspirin 81 MG chewable tablet Chew 81 mg by mouth daily.   gabapentin (NEURONTIN) 600 MG tablet Take 1 tablet (600 mg total) by mouth 3 (three) times daily.   omeprazole (PRILOSEC) 20 MG capsule Take 1 capsule (20 mg total) by mouth daily.   pravastatin (PRAVACHOL) 20 MG tablet TAKE 1 TABLET (20 MG TOTAL) BY MOUTH DAILY.   traZODone (DESYREL) 50 MG tablet Take 0.5-1 tablets (25-50 mg total) by mouth at bedtime as needed for sleep.   Carboxymethylcellul-Glycerin (LUBRICATING EYE DROPS OP) Place 1 drop into both eyes daily. (Patient not taking: Reported on 01/26/2022)   chlorhexidine (PERIDEX) 0.12 % solution Use as directed 15 mLs in the mouth or throat daily. (Patient not taking: Reported on 01/26/2022)   cholecalciferol (VITAMIN D3) 25 MCG (1000 UNIT) tablet Take 1,000 Units by mouth daily. (Patient not taking: Reported on 01/26/2022)   cyclobenzaprine (FLEXERIL) 10 MG tablet Take 1 tablet (10 mg total) by mouth 3 (three) times daily as needed for muscle spasms. (Patient not taking: Reported on 01/26/2022)   FLUoxetine (PROZAC) 40 MG capsule Take 1 capsule (40 mg total) by mouth every other day for 7 days, THEN 1 capsule (40 mg total) every 36 (thirty-six) hours for 7 days, THEN 1 capsule (40 mg total) once a week for 7 days.   folic acid (FOLVITE) 1 MG tablet Take 1 tablet (1 mg total) by mouth daily. (Patient not taking: Reported on 01/26/2022)  HYDROcodone-acetaminophen (NORCO/VICODIN) 5-325 MG tablet Take 1 tablet by mouth every 4 (four) hours as needed for moderate pain ((score 4 to 6)). (Patient not taking: Reported on 01/26/2022)   ondansetron (ZOFRAN ODT) 4 MG disintegrating tablet Take 1 tablet (4 mg total) by mouth every 8 (eight) hours as needed for nausea or vomiting. (Patient not taking: Reported on 01/26/2022)   sildenafil (VIAGRA) 100 MG tablet Take 1 tablet (100 mg total) by mouth daily as needed for erectile dysfunction. (Patient not taking: Reported on 01/26/2022)   thiamine (VITAMIN B-1)  100 MG tablet Take 1 tablet (100 mg total) by mouth daily. (Patient not taking: Reported on 01/26/2022)   No facility-administered encounter medications on file as of 01/26/2022.     Review of Systems  Review of Systems  Constitutional: Negative.   HENT: Negative.    Cardiovascular: Negative.   Gastrointestinal: Negative.   Allergic/Immunologic: Negative.   Neurological: Negative.   Psychiatric/Behavioral: Negative.         Physical Exam  BP 112/86   Pulse 96   Temp (!) 97.3 F (36.3 C) (Temporal)   Ht 5\' 11"  (1.803 m)   Wt 133 lb 12.8 oz (60.7 kg)   SpO2 100%   BMI 18.66 kg/m   Wt Readings from Last 5 Encounters:  01/26/22 133 lb 12.8 oz (60.7 kg)  12/28/21 138 lb (62.6 kg)  03/07/21 137 lb 12.8 oz (62.5 kg)  02/28/21 138 lb (62.6 kg)  02/16/21 138 lb 12.8 oz (63 kg)     Physical Exam Vitals and nursing note reviewed.  Constitutional:      General: He is not in acute distress.    Appearance: He is well-developed.  Cardiovascular:     Rate and Rhythm: Normal rate and regular rhythm.  Pulmonary:     Effort: Pulmonary effort is normal.     Breath sounds: Normal breath sounds.  Skin:    General: Skin is warm and dry.  Neurological:     Mental Status: He is alert and oriented to person, place, and time.     Gait: Gait abnormal.      Lab Results:  CBC    Component Value Date/Time   WBC 4.7 12/28/2021 1628   WBC 4.7 02/25/2021 1748   RBC 5.11 12/28/2021 1628   RBC 5.13 02/25/2021 1748   HGB 16.4 12/28/2021 1628   HCT 47.5 12/28/2021 1628   PLT 239 12/28/2021 1628   MCV 93 12/28/2021 1628   MCH 32.1 12/28/2021 1628   MCH 31.8 02/25/2021 1748   MCHC 34.5 12/28/2021 1628   MCHC 33.5 02/25/2021 1748   RDW 13.4 12/28/2021 1628   LYMPHSABS 1.6 02/25/2021 1748   LYMPHSABS 0.9 04/18/2020 1007   MONOABS 0.6 02/25/2021 1748   EOSABS 0.1 02/25/2021 1748   EOSABS 0.0 04/18/2020 1007   BASOSABS 0.0 02/25/2021 1748   BASOSABS 0.0 04/18/2020 1007     BMET    Component Value Date/Time   NA 142 12/28/2021 1628   K 4.5 12/28/2021 1628   CL 104 12/28/2021 1628   CO2 21 12/28/2021 1628   GLUCOSE 80 12/28/2021 1628   GLUCOSE 89 02/25/2021 1748   BUN 5 (L) 12/28/2021 1628   CREATININE 0.73 (L) 12/28/2021 1628   CREATININE 0.92 03/06/2017 1023   CALCIUM 9.2 12/28/2021 1628   GFRNONAA >60 02/25/2021 1748   GFRNONAA 91 03/06/2017 1023   GFRAA 100 04/18/2020 1007   GFRAA 105 03/06/2017 1023    BNP No results found for: "  BNP"  ProBNP No results found for: "PROBNP"  Imaging: No results found.   Assessment & Plan:   Lumbosacral spondylolysis - Ambulatory referral to Neurosurgery - Ambulatory referral to Home Health  2. Fall, initial encounter  - Ambulatory referral to Home Health  3. Decreased mobility  - Ambulatory referral to Home Health  Follow up:  Follow up in 3 months or sooner if needed     Ivonne Andrew, NP 01/26/2022

## 2022-01-26 NOTE — Patient Instructions (Signed)
1. Lumbosacral spondylolysis  - Ambulatory referral to Neurosurgery - Ambulatory referral to Home Health  2. Fall, initial encounter  - Ambulatory referral to Home Health  3. Decreased mobility  - Ambulatory referral to Home Health  Follow up:  Follow up in 3 months or sooner if needed

## 2022-01-30 ENCOUNTER — Other Ambulatory Visit: Payer: Self-pay

## 2022-01-30 ENCOUNTER — Telehealth: Payer: Self-pay | Admitting: Nurse Practitioner

## 2022-01-30 NOTE — Telephone Encounter (Signed)
Error

## 2022-01-30 NOTE — Telephone Encounter (Signed)
Pt LVM on nurse line requesting orders for a cane and motorized wheelchair. Stated it was discussed during his office visit on 9/8. Pt has difficulty walking and is in constant pain. Mobility is limited.  Has an appt with Neurology this week and will mention these items during that appt as well.   Requests a callback by provider.

## 2022-02-01 ENCOUNTER — Other Ambulatory Visit: Payer: Self-pay | Admitting: Nurse Practitioner

## 2022-02-01 DIAGNOSIS — R2689 Other abnormalities of gait and mobility: Secondary | ICD-10-CM

## 2022-02-01 NOTE — Telephone Encounter (Signed)
We discussed home health and that was ordered at that visit. Leonard Ramos I have placed an order for the cane and ref to PT for evaluation for motorized wheelchair. Thanks.

## 2022-02-02 ENCOUNTER — Telehealth: Payer: Self-pay | Admitting: Nurse Practitioner

## 2022-02-02 ENCOUNTER — Other Ambulatory Visit: Payer: Self-pay | Admitting: Student

## 2022-02-02 DIAGNOSIS — G992 Myelopathy in diseases classified elsewhere: Secondary | ICD-10-CM

## 2022-02-02 NOTE — Telephone Encounter (Signed)
Leonard Ramos from [facility] called requesting a wheel chair referral be made.  Also requesting someone call her back at [phone]

## 2022-02-06 ENCOUNTER — Telehealth: Payer: Self-pay

## 2022-02-06 DIAGNOSIS — F1010 Alcohol abuse, uncomplicated: Secondary | ICD-10-CM

## 2022-02-06 MED ORDER — VITAMIN B-1 100 MG PO TABS: 100.0000 mg | ORAL_TABLET | Freq: Every day | ORAL | 6 refills | Status: DC

## 2022-02-06 MED ORDER — OMEPRAZOLE 20 MG PO CPDR: 20.0000 mg | DELAYED_RELEASE_CAPSULE | Freq: Every day | ORAL | 0 refills | Status: DC

## 2022-02-06 NOTE — Telephone Encounter (Signed)
No additional notes needed  

## 2022-02-06 NOTE — Telephone Encounter (Signed)
    [facility] [phone] (Crystal) came into office and stated if they can get a referral for a wheel chair or a Rolator for the pt to be able to walk

## 2022-02-06 NOTE — Telephone Encounter (Signed)
Please see chart - a referral was already placed for PT for motorized wheelchair assessment

## 2022-02-17 ENCOUNTER — Ambulatory Visit
Admission: RE | Admit: 2022-02-17 | Discharge: 2022-02-17 | Disposition: A | Discharge status: Home / Self Care | Payer: Medicare Other | Source: Ambulatory Visit | Attending: Student | Admitting: Student

## 2022-02-17 ENCOUNTER — Other Ambulatory Visit: Payer: Self-pay | Admitting: Nurse Practitioner

## 2022-02-17 DIAGNOSIS — G992 Myelopathy in diseases classified elsewhere: Secondary | ICD-10-CM

## 2022-02-28 ENCOUNTER — Other Ambulatory Visit: Payer: Self-pay

## 2022-02-28 DIAGNOSIS — G4700 Insomnia, unspecified: Secondary | ICD-10-CM

## 2022-02-28 MED ORDER — TRAZODONE HCL 50 MG PO TABS: 25.0000 mg | ORAL_TABLET | Freq: Every evening | ORAL | 3 refills | Status: DC | PRN

## 2022-03-09 ENCOUNTER — Other Ambulatory Visit: Payer: Self-pay | Admitting: Neurosurgery

## 2022-03-27 ENCOUNTER — Telehealth: Payer: Self-pay | Admitting: Nurse Practitioner

## 2022-03-28 NOTE — Telephone Encounter (Signed)
error 

## 2022-03-29 ENCOUNTER — Encounter (HOSPITAL_COMMUNITY): Payer: Self-pay

## 2022-03-29 NOTE — Pre-Procedure Instructions (Signed)
Surgical Instructions    Your procedure is scheduled on Monday November 20.  Report to [facility] Main Entrance "A" at 6:00 A.M., then check in with the Admitting office.  Call this number if you have problems the morning of surgery:  [phone]  If you have any questions prior to your surgery date call [phone]: Open Monday-Friday 8am-4pm  If you experience any cold, Covid, or flu symptoms such as cough, fever, chills, shortness of breath, etc. between now and your scheduled surgery, please notify us at the above number   Patient Instructions   The night before surgery:    No food or drink after midnight.      Take these medications the morning of surgery with A SIP OF WATER:  gabapentin (NEURONTIN)  omeprazole (PRILOSEC)  pravastatin (PRAVACHOL)   Follow your surgeon's instructions on when to stop Aspirin.  If no instructions were given by your surgeon then you will need to call the office to get those instructions.    As of today, STOP taking any Aspirin (unless otherwise instructed by your surgeon) Aleve, Naproxen, Ibuprofen, Motrin, Advil, Goody's, BC's, all herbal medications, fish oil, and all vitamins.          Do NOT Smoke (Tobacco/Vaping)  24 hours prior to your procedure  If you use a CPAP at night, you may bring your mask for your overnight stay.   Contacts, glasses, hearing aids, dentures or partials may not be worn into surgery, please bring cases for these belongings   For patients admitted to the hospital, discharge time will be determined by your treatment team.   Patients discharged the day of surgery will not be allowed to drive home, and someone needs to stay with them for 24 hours.   SURGICAL WAITING ROOM VISITATION Patients having surgery or a procedure may have no more than 2 support people in the waiting area - these visitors may rotate.   Children under the age of 49 must have an adult with them who is not the patient. If the patient needs to  stay at the hospital during part of their recovery, the visitor guidelines for inpatient rooms apply. Pre-op nurse will coordinate an appropriate time for 1 support person to accompany patient in pre-op.  This support person may not rotate.   Please refer to the [facility] website for the visitor guidelines for Inpatients (after your surgery is over and you are in a regular room).    Special instructions:    Oral Hygiene is also important to reduce your risk of infection.  Remember -  BRUSH YOUR TEETH THE MORNING OF SURGERY WITH YOUR REGULAR TOOTHPASTE   [place]- Preparing For Surgery  Before surgery, you can play an important role. Because skin is not sterile, your skin needs to be as free of germs as possible. You can reduce the number of germs on your skin by washing with CHG (chlorahexidine gluconate) Soap before surgery.  CHG is an antiseptic cleaner which kills germs and bonds with the skin to continue killing germs even after washing.     Please do not use if you have an allergy to CHG or antibacterial soaps. If your skin becomes reddened/irritated stop using the CHG.  Do not shave (including legs and underarms) for at least 48 hours prior to first CHG shower. It is OK to shave your face.  Please follow these instructions carefully.    Shower the NIGHT BEFORE SURGERY and the MORNING OF SURGERY with CHG Soap.  If you chose to wash your hair, wash your hair first as usual with your normal shampoo.  After you shampoo, rinse your hair and body thoroughly to remove the shampoo.   Then [employer] and genitals (private parts) with your normal soap and rinse thoroughly to remove soap.  After that Use CHG Soap as you would any other liquid soap.  You can apply CHG directly to the skin and wash gently with a scrungie or a clean washcloth.   Apply the CHG Soap to your body ONLY FROM THE NECK DOWN.   Do not use on open wounds or open sores.  Avoid contact with your eyes, ears, mouth  and genitals (private parts).  Wash thoroughly, paying special attention to the area where your surgery will be performed.  Thoroughly rinse your body with warm water from the neck down.  DO NOT shower/wash with your normal soap after using and rinsing off the CHG Soap.  Pat yourself dry with a CLEAN TOWEL.  Wear CLEAN PAJAMAS to bed the night before surgery  Place CLEAN SHEETS on your bed the night before your surgery  DO NOT SLEEP WITH PETS.   Day of Surgery:  Take a shower with CHG soap. Wear Clean/Comfortable clothing the morning of surgery Brush your teeth WITH YOUR REGULAR TOOTHPASTE. Do not wear jewelry. Do not wear lotions, powders, cologne or deodorant. Men may shave face and neck. Do not bring valuables to the hospital.  [facility] is not responsible for any belongings or valuables.    If you received a COVID test during your pre-op visit, it is requested that you wear a mask when out in public, stay away from anyone that may not be feeling well, and notify your surgeon if you develop symptoms. If you have been in contact with anyone that has tested positive in the last 10 days, please notify your surgeon.    Please read over the following fact sheets that you were given.

## 2022-03-29 NOTE — Progress Notes (Signed)
Internal Med - Angus Seller, NP Cardiologist - Dr Olga Millers  Chest x-ray - n/a EKG - 03/30/22 Stress Test - 12/06/20 ECHO - 12/27/20 Cardiac Cath - n/a  ICD Pacemaker/Loop - n/a  Sleep Study -  n/a CPAP - none  Aspirin Instructions: Follow your surgeon's instructions on when to stop aspirin prior to surgery,  If no instructions were given by your surgeon then you will need to call the office for those instructions.  Anesthesia review: Yes  STOP now taking any Aspirin (unless otherwise instructed by your surgeon), Aleve, Naproxen, Ibuprofen, Motrin, Advil, Goody's, BC's, all herbal medications, fish oil, and all vitamins.   Coronavirus Screening Do you have any of the following symptoms:  Cough yes/no: No Fever (>100.56F)  yes/no: No Runny nose yes/no: No Sore throat yes/no: No Difficulty breathing/shortness of breath  yes/no: No  Have you traveled in the last 14 days and where? yes/no: No  Patient verbalized understanding of instructions that were given to them at the PAT appointment. Patient was also instructed that they will need to review over the PAT instructions again at home before surgery.

## 2022-03-30 ENCOUNTER — Other Ambulatory Visit: Payer: Self-pay

## 2022-03-30 ENCOUNTER — Encounter (HOSPITAL_COMMUNITY)
Admission: RE | Admit: 2022-03-30 | Discharge: 2022-03-30 | Disposition: A | Discharge status: Home / Self Care | Payer: Medicare Other | Source: Ambulatory Visit | Attending: Neurosurgery | Admitting: Neurosurgery

## 2022-03-30 ENCOUNTER — Encounter (HOSPITAL_COMMUNITY): Payer: Self-pay

## 2022-03-30 VITALS — BP 139/97 | HR 102 | Temp 97.5°F | Resp 18 | Ht 71.5 in | Wt 140.5 lb

## 2022-03-30 DIAGNOSIS — Z01818 Encounter for other preprocedural examination: Secondary | ICD-10-CM

## 2022-03-30 HISTORY — DX: Gastro-esophageal reflux disease without esophagitis: K21.9

## 2022-03-30 HISTORY — DX: Other psychoactive substance abuse, uncomplicated: F19.10

## 2022-03-30 LAB — COMPREHENSIVE METABOLIC PANEL
ALT: 26 U/L (ref 0–44)
AST: 35 U/L (ref 15–41)
Albumin: 3.7 g/dL (ref 3.5–5.0)
Alkaline Phosphatase: 66 U/L (ref 38–126)
Anion gap: 8 (ref 5–15)
BUN: 13 mg/dL (ref 8–23)
CO2: 22 mmol/L (ref 22–32)
Calcium: 8.5 mg/dL — ABNORMAL LOW (ref 8.9–10.3)
Chloride: 109 mmol/L (ref 98–111)
Creatinine, Ser: 1 mg/dL (ref 0.61–1.24)
GFR, Estimated: >60 mL/min (ref >60)
Glucose, Bld: 90 mg/dL (ref 70–99)
Potassium: 4.1 mmol/L (ref 3.5–5.1)
Sodium: 139 mmol/L (ref 135–145)
Total Bilirubin: 0.6 mg/dL (ref 0.3–1.2)
Total Protein: 6.5 g/dL (ref 6.5–8.1)

## 2022-03-30 LAB — CBC
HCT: 46.6 % (ref 39.0–52.0)
Hemoglobin: 16 g/dL (ref 13.0–17.0)
MCH: 31.3 pg (ref 26.0–34.0)
MCHC: 34.3 g/dL (ref 30.0–36.0)
MCV: 91 fL (ref 80.0–100.0)
Platelets: 221 10*3/uL (ref 150–400)
RBC: 5.12 MIL/uL (ref 4.22–5.81)
RDW: 14.6 % (ref 11.5–15.5)
WBC: 5 10*3/uL (ref 4.0–10.5)
nRBC: 0 % (ref 0.0–0.2)

## 2022-03-30 LAB — SURGICAL PCR SCREEN
MRSA, PCR: NEGATIVE
Staphylococcus aureus: NEGATIVE

## 2022-04-02 NOTE — Progress Notes (Signed)
Anesthesia Chart Review:  Case: [id number] Date/Time: 04/09/22 0745   Procedure: Laminectomy and Foraminotomy - L2-L3 - L3-L4 - L4-L5 (Back)   Anesthesia type: General   Pre-op diagnosis: Stenosis   [facility] ROOM 19 / [facility]   Surgeons: Julio Sicks, MD       DISCUSSION: Patient is a 66 year old male scheduled for the above procedure. He was referred to neurosurgery by his PCP Ivonne Andrew, NP in September for frequent falls, numbness in his BLE.   History includes smoking, polysubstance abuse (cocaine, alcohol, tobacco, marijuana), HLD, liver disease (hepatic steatosis by 2020 [place]), GERD, anxiety, spinal surgery (C3-6 ACDF 02/28/21), denture.    I evaluated Leonard Ramos prior to his ACDF in 2022. He was sent to cardiology for a preoperative evaluation due to chest pain in the setting of cocaine. He subsequently saw Dr. Jens Som on 12/02/20 and had a stress test and echocardiogram. 12/06/20 stress test was low risk, mildly abnormal stress nuclear study with inferior apical thinning but no ischemia, EF 44% with mild global hypokinesis. 12/27/20 echocardiogram showed LVEF 45-50%, moderate hypokinesis of the left ventricular, basal-mid inferior wall  and inferolateral wall. He went on to have C3-6 ACDF on 02/28/21.   He had follow-up with Dr. Jens Som on 03/07/21. He wrote, "I personally reviewed the echocardiogram and LV function appears to be low normal." Leonard Ramos continued to engage in polysubstance use, so avoidance encouraged. As needed cardiology follow-up recommended. Per documentation, he continues to smoke marijuana daily. He has cut back alcohol to 3 drinks/day. (ED visit for fall with alcohol intoxication 05/21/21.) Last reported cocaine 03/21/22.   As above, known polysubstance use. 03/30/22 labs acceptable. Anesthesia team to evaluate on the day of surgery and definitive anesthesia plan at that time.    VS: BP (!) 139/97   Pulse (!) 102   Temp (!) 36.4 C (Oral)   Resp 18   Ht 5'  11.5" (1.816 m)   Wt 63.7 kg   SpO2 100%   BMI 19.32 kg/m    PROVIDERS: Ivonne Andrew, NP is PCP  Olga Millers, MD is cardiologist   LABS: Labs reviewed: Acceptable for surgery. (all labs ordered are listed, but only abnormal results are displayed)  Labs Reviewed  COMPREHENSIVE METABOLIC PANEL - Abnormal; Notable for the following components:      Result Value   Calcium 8.5 (*)    All other components within normal limits  SURGICAL PCR SCREEN  CBC  TYPE AND SCREEN     IMAGES: MRI C-spine 02/17/22: IMPRESSION: 1. Evaluation is significantly limited by motion artifact. Within this limitation, interval ACDF C3-C6 with improved spinal canal stenosis at these levels, with mild residual spinal canal stenosis at C3-C4. Facet and uncovertebral hypertrophy at these levels causes moderate spinal canal stenosis bilaterally at C3-C4 and on the left at C4-C5. 2. C2-C3 mild spinal canal stenosis and moderate to severe left neural foraminal narrowing. 3. C7-T1 severe bilateral neural foraminal narrowing, unchanged. 4. C6-C7 mild bilateral neural foraminal narrowing, unchanged.  MRI L-spine 02/17/22: IMPRESSION: 1. L2-L3 severe spinal canal stenosis, which has progressed from the prior exam, with unchanged severe right and mild left neural foraminal narrowing. 2. L3-L4 severe spinal canal stenosis, with severe right and mild left neural foraminal narrowing. 3. L4-L5 severe spinal canal stenosis, with moderate to severe bilateral neural foraminal narrowing. 4. L5-S1 severe left and mild right neural foraminal narrowing. 5. L1-L2 mild-to-moderate spinal canal stenosis, which has progressed from the prior  exam.   EKG: Normal sinus rhythm Prolonged QT [QT 382 ms, QTcB 480 ms] Abnormal ECG When compared with ECG of 25-Feb-2021 17:51, PREVIOUS ECG IS PRESENT No significant change since last tracing Confirmed by Rinaldo Cloud ([id number]) on 03/30/2022 10:20:48 PM   CV: Echo  12/27/20: IMPRESSIONS   1. Left ventricular ejection fraction, by estimation, is 45 to 50%. The  left ventricle has mildly decreased function. The left ventricle  demonstrates regional wall motion abnormalities (see scoring  diagram/findings for description). There is mild left  ventricular hypertrophy. Left ventricular diastolic parameters are  consistent with Grade I diastolic dysfunction (impaired relaxation). There  is moderate hypokinesis of the left ventricular, basal-mid inferior wall  and inferolateral wall.   2. Right ventricular systolic function is hyperdynamic. The right  ventricular size is normal.   3. The mitral valve is abnormal. Trivial mitral valve regurgitation.   4. The aortic valve is tricuspid. Aortic valve regurgitation is not  visualized.   5. The inferior vena cava is normal in size with greater than 50%  respiratory variability, suggesting right atrial pressure of 3 mmHg.  Comparison(s): No prior Echocardiogram.      Nuclear stress test 12/06/20: The left ventricular ejection fraction is moderately decreased (30-44%). Nuclear stress EF: 44%. There was no ST segment deviation noted during stress. Defect 1: There is a small defect of moderate severity present in the apical inferior and apex location. This is a low risk study.   Low risk, mildly abnormal stress nuclear study with inferior apical thinning but no ischemia.  Gated ejection fraction 44% with mild global hypokinesis.  Mild left ventricular enlargement.  Suggest echocardiogram to better assess LV function.   Past Medical History:  Diagnosis Date   Alcohol use    Anxiety    Arthritis    GERD (gastroesophageal reflux disease)    Hyperlipidemia    Liver disease    Neuropathy    hands tingling and feet numbness   Periodontitis, chronic, generalized    Substance abuse (HCC)    Tobacco use    Vitamin D deficiency 03/2019    Past Surgical History:  Procedure Laterality Date   ANTERIOR CERVICAL  DECOMP/DISCECTOMY FUSION N/A 02/28/2021   Procedure: Anterior Cervical Discectomy Fusion - Cervical three-Cervical four - Cervical four-Cervical five - Cervical five-Cervical six;  Surgeon: Julio Sicks, MD;  Location: [facility] OR;  Service: Neurosurgery;  Laterality: N/A;   MULTIPLE EXTRACTIONS WITH ALVEOLOPLASTY Bilateral 09/04/2019   Procedure: MULTIPLE EXTRACTION W/ ALVEOLOPLASTY, Removal of left maxillary buccal exostosis;  Surgeon: Ocie Doyne, DDS;  Location: [facility] OR;  Service: Oral Surgery;  Laterality: Bilateral;    MEDICATIONS:  aspirin 81 MG chewable tablet   folic acid (FOLVITE) 1 MG tablet   gabapentin (NEURONTIN) 600 MG tablet   omeprazole (PRILOSEC) 20 MG capsule   pravastatin (PRAVACHOL) 20 MG tablet   thiamine (VITAMIN B-1) 100 MG tablet   traZODone (DESYREL) 50 MG tablet   No current facility-administered medications for this encounter.    Shonna Chock, PA-C Surgical Short Stay/Anesthesiology [facility] Phone [phone] [facility] Phone [phone] 04/02/2022 4:47 PM

## 2022-04-02 NOTE — Anesthesia Preprocedure Evaluation (Addendum)
Anesthesia Evaluation  Patient identified by MRN, date of birth, ID band Patient awake    Reviewed: Allergy & Precautions, NPO status , Patient's Chart, lab work & pertinent test results  Airway Mallampati: I  TM Distance: >3 FB Neck ROM: Full    Dental  (+) Edentulous Upper, Edentulous Lower   Pulmonary Current Smoker and Patient abstained from smoking.   breath sounds clear to auscultation       Cardiovascular  Rhythm:Regular Rate:Normal     Neuro/Psych  PSYCHIATRIC DISORDERS Anxiety Depression       GI/Hepatic Neg liver ROS,GERD  ,,  Endo/Other  negative endocrine ROS    Renal/GU negative Renal ROS     Musculoskeletal  (+) Arthritis ,    Abdominal Normal abdominal exam  (+)   Peds  Hematology negative hematology ROS (+)   Anesthesia Other Findings   Reproductive/Obstetrics                             Anesthesia Physical Anesthesia Plan  ASA: 2  Anesthesia Plan: General   Post-op Pain Management:    Induction: Intravenous  PONV Risk Score and Plan: 2 and Ondansetron, Dexamethasone and Midazolam  Airway Management Planned: Oral ETT  Additional Equipment: None  Intra-op Plan:   Post-operative Plan: Extubation in OR  Informed Consent: I have reviewed the patients History and Physical, chart, labs and discussed the procedure including the risks, benefits and alternatives for the proposed anesthesia with the patient or authorized representative who has indicated his/her understanding and acceptance.       Plan Discussed with: CRNA  Anesthesia Plan Comments: (PAT note written 04/02/2022 by Shonna Chock, PA-C.  )       Anesthesia Quick Evaluation

## 2022-04-09 ENCOUNTER — Encounter (HOSPITAL_COMMUNITY)
Admission: RE | Disposition: A | Discharge status: Home / Self Care | Payer: Self-pay | Source: Home / Self Care | Attending: Neurosurgery

## 2022-04-09 ENCOUNTER — Ambulatory Visit (HOSPITAL_COMMUNITY): Payer: Medicare Other | Admitting: Vascular Surgery

## 2022-04-09 ENCOUNTER — Encounter (HOSPITAL_COMMUNITY): Payer: Self-pay | Admitting: Neurosurgery

## 2022-04-09 ENCOUNTER — Observation Stay (HOSPITAL_BASED_OUTPATIENT_CLINIC_OR_DEPARTMENT_OTHER): Payer: Medicare Other | Admitting: Anesthesiology

## 2022-04-09 ENCOUNTER — Ambulatory Visit (HOSPITAL_BASED_OUTPATIENT_CLINIC_OR_DEPARTMENT_OTHER): Payer: Medicare Other

## 2022-04-09 ENCOUNTER — Observation Stay (HOSPITAL_COMMUNITY)
Admission: RE | Admit: 2022-04-09 | Discharge: 2022-04-10 | Disposition: A | Discharge status: Home / Self Care | Payer: Medicare Other | Attending: Neurosurgery | Admitting: Neurosurgery

## 2022-04-09 ENCOUNTER — Ambulatory Visit (HOSPITAL_COMMUNITY): Payer: Medicare Other

## 2022-04-09 ENCOUNTER — Observation Stay (HOSPITAL_COMMUNITY): Payer: Medicare Other | Admitting: Anesthesiology

## 2022-04-09 ENCOUNTER — Other Ambulatory Visit: Payer: Self-pay

## 2022-04-09 DIAGNOSIS — F17210 Nicotine dependence, cigarettes, uncomplicated: Secondary | ICD-10-CM | POA: Insufficient documentation

## 2022-04-09 DIAGNOSIS — M96840 Postprocedural hematoma of a musculoskeletal structure following a musculoskeletal system procedure: Secondary | ICD-10-CM

## 2022-04-09 DIAGNOSIS — F418 Other specified anxiety disorders: Secondary | ICD-10-CM | POA: Diagnosis not present

## 2022-04-09 DIAGNOSIS — G9762 Postprocedural hematoma of a nervous system organ or structure following other procedure: Secondary | ICD-10-CM | POA: Diagnosis not present

## 2022-04-09 DIAGNOSIS — M1990 Unspecified osteoarthritis, unspecified site: Secondary | ICD-10-CM | POA: Diagnosis not present

## 2022-04-09 DIAGNOSIS — M48062 Spinal stenosis, lumbar region with neurogenic claudication: Principal | ICD-10-CM | POA: Insufficient documentation

## 2022-04-09 DIAGNOSIS — M47816 Spondylosis without myelopathy or radiculopathy, lumbar region: Secondary | ICD-10-CM

## 2022-04-09 HISTORY — PX: LUMBAR WOUND DEBRIDEMENT: SHX1988

## 2022-04-09 HISTORY — PX: LUMBAR LAMINECTOMY/DECOMPRESSION MICRODISCECTOMY: SHX5026

## 2022-04-09 LAB — TYPE AND SCREEN
ABO/RH(D): O POS
Antibody Screen: NEGATIVE

## 2022-04-09 SURGERY — LUMBAR WOUND DEBRIDEMENT
Anesthesia: General

## 2022-04-09 SURGERY — LUMBAR LAMINECTOMY/DECOMPRESSION MICRODISCECTOMY 3 LEVELS
Anesthesia: General | Site: Spine Lumbar

## 2022-04-09 MED ORDER — ROCURONIUM BROMIDE 10 MG/ML (PF) SYRINGE
PREFILLED_SYRINGE | INTRAVENOUS | Status: AC
  Filled 2022-04-09: qty 10

## 2022-04-09 MED ORDER — MIDAZOLAM HCL 2 MG/2ML IJ SOLN
INTRAMUSCULAR | Status: DC | PRN
  Administered 2022-04-09: 2 mg via INTRAVENOUS

## 2022-04-09 MED ORDER — SUGAMMADEX SODIUM 200 MG/2ML IV SOLN
INTRAVENOUS | Status: DC | PRN
  Administered 2022-04-09: 200 mg via INTRAVENOUS

## 2022-04-09 MED ORDER — CEFAZOLIN SODIUM-DEXTROSE 1-4 GM/50ML-% IV SOLN
1.0000 g | Freq: Three times a day (TID) | INTRAVENOUS | Status: AC
  Administered 2022-04-09 (×2): 1 g via INTRAVENOUS
  Filled 2022-04-09 (×2): qty 50

## 2022-04-09 MED ORDER — FENTANYL CITRATE (PF) 250 MCG/5ML IJ SOLN
INTRAMUSCULAR | Status: AC
  Filled 2022-04-09: qty 5

## 2022-04-09 MED ORDER — FENTANYL CITRATE (PF) 250 MCG/5ML IJ SOLN
INTRAMUSCULAR | Status: DC | PRN
  Administered 2022-04-09: 50 ug via INTRAVENOUS
  Administered 2022-04-09 (×2): 100 ug via INTRAVENOUS

## 2022-04-09 MED ORDER — MENTHOL 3 MG MT LOZG: 1.0000 | LOZENGE | OROMUCOSAL | Status: DC | PRN

## 2022-04-09 MED ORDER — ACETAMINOPHEN 10 MG/ML IV SOLN: 1000.0000 mg | Freq: Once | INTRAVENOUS | Status: DC | PRN

## 2022-04-09 MED ORDER — PROMETHAZINE HCL 25 MG/ML IJ SOLN: 6.2500 mg | INTRAMUSCULAR | Status: DC | PRN

## 2022-04-09 MED ORDER — ACETAMINOPHEN 650 MG RE SUPP: 650.0000 mg | RECTAL | Status: DC | PRN

## 2022-04-09 MED ORDER — LACTATED RINGERS IV SOLN: INTRAVENOUS | Status: DC

## 2022-04-09 MED ORDER — PHENYLEPHRINE HCL-NACL 20-0.9 MG/250ML-% IV SOLN
INTRAVENOUS | Status: DC | PRN
  Administered 2022-04-09: 25 ug/min via INTRAVENOUS

## 2022-04-09 MED ORDER — ONDANSETRON HCL 4 MG/2ML IJ SOLN
INTRAMUSCULAR | Status: DC | PRN
  Administered 2022-04-09: 4 mg via INTRAVENOUS

## 2022-04-09 MED ORDER — KETOROLAC TROMETHAMINE 15 MG/ML IJ SOLN
30.0000 mg | Freq: Four times a day (QID) | INTRAMUSCULAR | Status: AC
  Administered 2022-04-09 – 2022-04-10 (×3): 30 mg via INTRAVENOUS
  Filled 2022-04-09 (×3): qty 2

## 2022-04-09 MED ORDER — DIPHENHYDRAMINE HCL 50 MG/ML IJ SOLN
12.5000 mg | Freq: Four times a day (QID) | INTRAMUSCULAR | Status: DC | PRN
  Administered 2022-04-09: 12.5 mg via INTRAVENOUS

## 2022-04-09 MED ORDER — BUPIVACAINE HCL (PF) 0.25 % IJ SOLN
INTRAMUSCULAR | Status: DC | PRN
  Administered 2022-04-09: 20 mL

## 2022-04-09 MED ORDER — VANCOMYCIN HCL 1000 MG IV SOLR
INTRAVENOUS | Status: AC
  Filled 2022-04-09: qty 20

## 2022-04-09 MED ORDER — THIAMINE MONONITRATE 100 MG PO TABS
100.0000 mg | ORAL_TABLET | Freq: Every day | ORAL | Status: DC
  Administered 2022-04-09 – 2022-04-10 (×2): 100 mg via ORAL
  Filled 2022-04-09 (×3): qty 1

## 2022-04-09 MED ORDER — FOLIC ACID 1 MG PO TABS
1.0000 mg | ORAL_TABLET | Freq: Every day | ORAL | Status: DC
  Administered 2022-04-09 – 2022-04-10 (×2): 1 mg via ORAL
  Filled 2022-04-09 (×2): qty 1

## 2022-04-09 MED ORDER — PHENOL 1.4 % MT LIQD: 1.0000 | OROMUCOSAL | Status: DC | PRN

## 2022-04-09 MED ORDER — PROPOFOL 10 MG/ML IV BOLUS
INTRAVENOUS | Status: DC | PRN
  Administered 2022-04-09: 20 mg via INTRAVENOUS
  Administered 2022-04-09: 130 mg via INTRAVENOUS

## 2022-04-09 MED ORDER — CEFAZOLIN SODIUM-DEXTROSE 2-4 GM/100ML-% IV SOLN
2.0000 g | INTRAVENOUS | Status: AC
  Administered 2022-04-09: 2 g via INTRAVENOUS
  Filled 2022-04-09: qty 100

## 2022-04-09 MED ORDER — DEXMEDETOMIDINE HCL IN NACL 80 MCG/20ML IV SOLN
INTRAVENOUS | Status: DC | PRN
  Administered 2022-04-09: 12.5 ug via BUCCAL

## 2022-04-09 MED ORDER — DEXAMETHASONE SODIUM PHOSPHATE 10 MG/ML IJ SOLN
INTRAMUSCULAR | Status: AC
  Filled 2022-04-09: qty 2

## 2022-04-09 MED ORDER — ROCURONIUM BROMIDE 10 MG/ML (PF) SYRINGE
PREFILLED_SYRINGE | INTRAVENOUS | Status: DC | PRN
  Administered 2022-04-09: 20 mg via INTRAVENOUS
  Administered 2022-04-09: 50 mg via INTRAVENOUS
  Administered 2022-04-09: 30 mg via INTRAVENOUS

## 2022-04-09 MED ORDER — DEXMEDETOMIDINE HCL IN NACL 80 MCG/20ML IV SOLN
INTRAVENOUS | Status: DC | PRN
  Administered 2022-04-09: 12 ug via BUCCAL
  Administered 2022-04-09 (×2): 8 ug via BUCCAL
  Administered 2022-04-09: 12 ug via BUCCAL

## 2022-04-09 MED ORDER — DIPHENHYDRAMINE HCL 50 MG/ML IJ SOLN
INTRAMUSCULAR | Status: AC
  Filled 2022-04-09: qty 1

## 2022-04-09 MED ORDER — FENTANYL CITRATE (PF) 250 MCG/5ML IJ SOLN
INTRAMUSCULAR | Status: DC | PRN
  Administered 2022-04-09: 150 ug via INTRAVENOUS
  Administered 2022-04-09: 100 ug via INTRAVENOUS
  Administered 2022-04-09: 150 ug via INTRAVENOUS

## 2022-04-09 MED ORDER — VANCOMYCIN HCL 1000 MG IV SOLR
INTRAVENOUS | Status: DC | PRN
  Administered 2022-04-09: 1000 mg via TOPICAL

## 2022-04-09 MED ORDER — MIDAZOLAM HCL 2 MG/2ML IJ SOLN
INTRAMUSCULAR | Status: AC
  Filled 2022-04-09: qty 2

## 2022-04-09 MED ORDER — PRAVASTATIN SODIUM 10 MG PO TABS
20.0000 mg | ORAL_TABLET | Freq: Every day | ORAL | Status: DC
  Administered 2022-04-09: 20 mg via ORAL
  Filled 2022-04-09: qty 2

## 2022-04-09 MED ORDER — SODIUM CHLORIDE 0.9 % IV SOLN
250.0000 mL | INTRAVENOUS | Status: DC
  Administered 2022-04-09: 250 mL via INTRAVENOUS

## 2022-04-09 MED ORDER — THROMBIN 20000 UNITS EX SOLR: CUTANEOUS | Status: DC | PRN

## 2022-04-09 MED ORDER — HYDROMORPHONE HCL 1 MG/ML IJ SOLN
INTRAMUSCULAR | Status: AC
  Filled 2022-04-09: qty 1

## 2022-04-09 MED ORDER — CHLORHEXIDINE GLUCONATE CLOTH 2 % EX PADS: 6.0000 | MEDICATED_PAD | Freq: Once | CUTANEOUS | Status: DC

## 2022-04-09 MED ORDER — ACETAMINOPHEN 325 MG PO TABS: 650.0000 mg | ORAL_TABLET | ORAL | Status: DC | PRN

## 2022-04-09 MED ORDER — 0.9 % SODIUM CHLORIDE (POUR BTL) OPTIME
TOPICAL | Status: DC | PRN
  Administered 2022-04-09: 1000 mL

## 2022-04-09 MED ORDER — CYCLOBENZAPRINE HCL 10 MG PO TABS
10.0000 mg | ORAL_TABLET | Freq: Three times a day (TID) | ORAL | Status: DC | PRN
  Administered 2022-04-09: 10 mg via ORAL
  Filled 2022-04-09: qty 1

## 2022-04-09 MED ORDER — CHLORHEXIDINE GLUCONATE 0.12 % MT SOLN
15.0000 mL | Freq: Once | OROMUCOSAL | Status: AC
  Administered 2022-04-09: 15 mL via OROMUCOSAL
  Filled 2022-04-09: qty 15

## 2022-04-09 MED ORDER — ACETAMINOPHEN 325 MG PO TABS: 325.0000 mg | ORAL_TABLET | Freq: Once | ORAL | Status: DC | PRN

## 2022-04-09 MED ORDER — SUCCINYLCHOLINE CHLORIDE 200 MG/10ML IV SOSY
PREFILLED_SYRINGE | INTRAVENOUS | Status: DC | PRN
  Administered 2022-04-09: 140 mg via INTRAVENOUS

## 2022-04-09 MED ORDER — LACTATED RINGERS IV SOLN: INTRAVENOUS | Status: DC | PRN

## 2022-04-09 MED ORDER — BUPIVACAINE HCL (PF) 0.25 % IJ SOLN
INTRAMUSCULAR | Status: AC
  Filled 2022-04-09: qty 30

## 2022-04-09 MED ORDER — GABAPENTIN 600 MG PO TABS
600.0000 mg | ORAL_TABLET | Freq: Three times a day (TID) | ORAL | Status: DC
  Administered 2022-04-09 – 2022-04-10 (×2): 600 mg via ORAL
  Filled 2022-04-09 (×2): qty 1

## 2022-04-09 MED ORDER — TRAZODONE HCL 50 MG PO TABS: 25.0000 mg | ORAL_TABLET | Freq: Every evening | ORAL | Status: DC | PRN

## 2022-04-09 MED ORDER — PROPOFOL 10 MG/ML IV BOLUS
INTRAVENOUS | Status: AC
  Filled 2022-04-09: qty 20

## 2022-04-09 MED ORDER — THROMBIN 20000 UNITS EX SOLR
CUTANEOUS | Status: DC | PRN
  Administered 2022-04-09: 20 mL via TOPICAL

## 2022-04-09 MED ORDER — SODIUM CHLORIDE 0.9% FLUSH: 3.0000 mL | INTRAVENOUS | Status: DC | PRN

## 2022-04-09 MED ORDER — ONDANSETRON HCL 4 MG PO TABS: 4.0000 mg | ORAL_TABLET | Freq: Four times a day (QID) | ORAL | Status: DC | PRN

## 2022-04-09 MED ORDER — PROPOFOL 10 MG/ML IV BOLUS
INTRAVENOUS | Status: DC | PRN
  Administered 2022-04-09: 100 mg via INTRAVENOUS

## 2022-04-09 MED ORDER — KETOROLAC TROMETHAMINE 30 MG/ML IJ SOLN
INTRAMUSCULAR | Status: DC | PRN
  Administered 2022-04-09: 30 mg via INTRAVENOUS

## 2022-04-09 MED ORDER — DEXAMETHASONE SODIUM PHOSPHATE 10 MG/ML IJ SOLN
INTRAMUSCULAR | Status: DC | PRN
  Administered 2022-04-09: 10 mg via INTRAVENOUS

## 2022-04-09 MED ORDER — ACETAMINOPHEN 160 MG/5ML PO SOLN: 325.0000 mg | Freq: Once | ORAL | Status: DC | PRN

## 2022-04-09 MED ORDER — ACETAMINOPHEN 10 MG/ML IV SOLN
1000.0000 mg | Freq: Once | INTRAVENOUS | Status: DC | PRN
  Administered 2022-04-09: 1000 mg via INTRAVENOUS

## 2022-04-09 MED ORDER — ORAL CARE MOUTH RINSE: 15.0000 mL | Freq: Once | OROMUCOSAL | Status: AC

## 2022-04-09 MED ORDER — ONDANSETRON HCL 4 MG/2ML IJ SOLN
INTRAMUSCULAR | Status: AC
  Filled 2022-04-09: qty 4

## 2022-04-09 MED ORDER — AMISULPRIDE (ANTIEMETIC) 5 MG/2ML IV SOLN: 10.0000 mg | Freq: Once | INTRAVENOUS | Status: DC | PRN

## 2022-04-09 MED ORDER — LIDOCAINE 2% (20 MG/ML) 5 ML SYRINGE
INTRAMUSCULAR | Status: AC
  Filled 2022-04-09: qty 5

## 2022-04-09 MED ORDER — HYDROCODONE-ACETAMINOPHEN 10-325 MG PO TABS
2.0000 | ORAL_TABLET | ORAL | Status: DC | PRN
  Administered 2022-04-09 – 2022-04-10 (×2): 2 via ORAL
  Filled 2022-04-09 (×2): qty 2

## 2022-04-09 MED ORDER — EPHEDRINE 5 MG/ML INJ
INTRAVENOUS | Status: AC
  Filled 2022-04-09: qty 5

## 2022-04-09 MED ORDER — PHENYLEPHRINE 80 MCG/ML (10ML) SYRINGE FOR IV PUSH (FOR BLOOD PRESSURE SUPPORT)
PREFILLED_SYRINGE | INTRAVENOUS | Status: DC | PRN
  Administered 2022-04-09: 160 ug via INTRAVENOUS
  Administered 2022-04-09 (×2): 80 ug via INTRAVENOUS
  Administered 2022-04-09 (×3): 160 ug via INTRAVENOUS

## 2022-04-09 MED ORDER — HYDROCODONE-ACETAMINOPHEN 5-325 MG PO TABS
1.0000 | ORAL_TABLET | ORAL | Status: DC | PRN
  Administered 2022-04-10: 1 via ORAL
  Filled 2022-04-09: qty 1

## 2022-04-09 MED ORDER — PHENYLEPHRINE HCL-NACL 20-0.9 MG/250ML-% IV SOLN
INTRAVENOUS | Status: AC
  Filled 2022-04-09: qty 500

## 2022-04-09 MED ORDER — THROMBIN 20000 UNITS EX SOLR
CUTANEOUS | Status: AC
  Filled 2022-04-09: qty 20000

## 2022-04-09 MED ORDER — MEPERIDINE HCL 25 MG/ML IJ SOLN: 6.2500 mg | INTRAMUSCULAR | Status: DC | PRN

## 2022-04-09 MED ORDER — SODIUM CHLORIDE 0.9% FLUSH: 3.0000 mL | Freq: Two times a day (BID) | INTRAVENOUS | Status: DC

## 2022-04-09 MED ORDER — LIDOCAINE 2% (20 MG/ML) 5 ML SYRINGE
INTRAMUSCULAR | Status: DC | PRN
  Administered 2022-04-09: 40 mg via INTRAVENOUS

## 2022-04-09 MED ORDER — PANTOPRAZOLE SODIUM 40 MG PO TBEC
40.0000 mg | DELAYED_RELEASE_TABLET | Freq: Every day | ORAL | Status: DC
  Administered 2022-04-09 – 2022-04-10 (×2): 40 mg via ORAL
  Filled 2022-04-09 (×2): qty 1

## 2022-04-09 MED ORDER — ONDANSETRON HCL 4 MG/2ML IJ SOLN: 4.0000 mg | Freq: Four times a day (QID) | INTRAMUSCULAR | Status: DC | PRN

## 2022-04-09 MED ORDER — THROMBIN 5000 UNITS EX SOLR
CUTANEOUS | Status: AC
  Filled 2022-04-09: qty 5000

## 2022-04-09 MED ORDER — PROPOFOL 500 MG/50ML IV EMUL
INTRAVENOUS | Status: DC | PRN
  Administered 2022-04-09: 100 ug/kg/min via INTRAVENOUS

## 2022-04-09 MED ORDER — HYDROMORPHONE HCL 1 MG/ML IJ SOLN
1.0000 mg | INTRAMUSCULAR | Status: DC | PRN
  Administered 2022-04-09: 1 mg via INTRAVENOUS
  Filled 2022-04-09: qty 1

## 2022-04-09 MED ORDER — HYDROMORPHONE HCL 1 MG/ML IJ SOLN
0.2500 mg | INTRAMUSCULAR | Status: DC | PRN
  Administered 2022-04-09 (×2): 0.5 mg via INTRAVENOUS

## 2022-04-09 MED ORDER — ACETAMINOPHEN 10 MG/ML IV SOLN
INTRAVENOUS | Status: AC
  Filled 2022-04-09: qty 100

## 2022-04-09 MED ORDER — HYDROMORPHONE HCL 1 MG/ML IJ SOLN: 0.2500 mg | INTRAMUSCULAR | Status: DC | PRN

## 2022-04-09 MED ORDER — LIDOCAINE 2% (20 MG/ML) 5 ML SYRINGE
INTRAMUSCULAR | Status: DC | PRN
  Administered 2022-04-09: 50 mg via INTRAVENOUS

## 2022-04-09 MED ORDER — PHENYLEPHRINE 80 MCG/ML (10ML) SYRINGE FOR IV PUSH (FOR BLOOD PRESSURE SUPPORT)
PREFILLED_SYRINGE | INTRAVENOUS | Status: AC
  Filled 2022-04-09: qty 10

## 2022-04-09 SURGICAL SUPPLY — 41 items
ADH SKN CLS APL DERMABOND .7 (GAUZE/BANDAGES/DRESSINGS) ×1
APL SKNCLS STERI-STRIP NONHPOA (GAUZE/BANDAGES/DRESSINGS) ×1
BAG COUNTER SPONGE SURGICOUNT (BAG) ×1 IMPLANT
BAG SPNG CNTER NS LX DISP (BAG) ×2
BAND INSRT 18 STRL LF DISP RB (MISCELLANEOUS)
BAND RUBBER #18 3X1/16 STRL (MISCELLANEOUS) ×2 IMPLANT
BENZOIN TINCTURE PRP APPL 2/3 (GAUZE/BANDAGES/DRESSINGS) ×1 IMPLANT
BUR CUTTER 7.0 ROUND (BURR) ×1 IMPLANT
CANISTER SUCT 3000ML PPV (MISCELLANEOUS) ×1 IMPLANT
DERMABOND ADVANCED .7 DNX12 (GAUZE/BANDAGES/DRESSINGS) ×1 IMPLANT
DRAPE HALF SHEET 40X57 (DRAPES) IMPLANT
DRAPE LAPAROTOMY 100X72X124 (DRAPES) ×1 IMPLANT
DRAPE MICROSCOPE SLANT 54X150 (MISCELLANEOUS) ×1 IMPLANT
DRAPE SURG 17X23 STRL (DRAPES) ×2 IMPLANT
DRSG OPSITE POSTOP 4X8 (GAUZE/BANDAGES/DRESSINGS) IMPLANT
DURAPREP 26ML APPLICATOR (WOUND CARE) ×1 IMPLANT
ELECT REM PT RETURN 9FT ADLT (ELECTROSURGICAL) ×1
ELECTRODE REM PT RTRN 9FT ADLT (ELECTROSURGICAL) ×1 IMPLANT
GLOVE BIO SURGEON STRL SZ 6.5 (GLOVE) ×1 IMPLANT
GLOVE BIOGEL PI IND STRL 6.5 (GLOVE) ×1 IMPLANT
GLOVE ECLIPSE 9.0 STRL (GLOVE) ×1 IMPLANT
GOWN STRL REUS W/ TWL LRG LVL3 (GOWN DISPOSABLE) IMPLANT
GOWN STRL REUS W/ TWL XL LVL3 (GOWN DISPOSABLE) ×1 IMPLANT
GOWN STRL REUS W/TWL 2XL LVL3 (GOWN DISPOSABLE) IMPLANT
GOWN STRL REUS W/TWL LRG LVL3 (GOWN DISPOSABLE) ×1
GOWN STRL REUS W/TWL XL LVL3 (GOWN DISPOSABLE) ×2
KIT BASIN OR (CUSTOM PROCEDURE TRAY) ×1 IMPLANT
KIT TURNOVER KIT B (KITS) ×1 IMPLANT
NDL SPNL 22GX3.5 QUINCKE BK (NEEDLE) IMPLANT
NEEDLE HYPO 22GX1.5 SAFETY (NEEDLE) ×1 IMPLANT
NEEDLE SPNL 22GX3.5 QUINCKE BK (NEEDLE) IMPLANT
NS IRRIG 1000ML POUR BTL (IV SOLUTION) ×1 IMPLANT
PACK LAMINECTOMY NEURO (CUSTOM PROCEDURE TRAY) ×1 IMPLANT
PAD ARMBOARD 7.5X6 YLW CONV (MISCELLANEOUS) ×3 IMPLANT
SPONGE SURGIFOAM ABS GEL 100 (HEMOSTASIS) IMPLANT
STRIP CLOSURE SKIN 1/2X4 (GAUZE/BANDAGES/DRESSINGS) ×1 IMPLANT
SUT VIC AB 2-0 CT1 18 (SUTURE) ×1 IMPLANT
SUT VIC AB 3-0 SH 8-18 (SUTURE) ×1 IMPLANT
TOWEL GREEN STERILE (TOWEL DISPOSABLE) ×1 IMPLANT
TOWEL GREEN STERILE FF (TOWEL DISPOSABLE) ×1 IMPLANT
WATER STERILE IRR 1000ML POUR (IV SOLUTION) ×1 IMPLANT

## 2022-04-09 SURGICAL SUPPLY — 42 items
ADH SKN CLS APL DERMABOND .7 (GAUZE/BANDAGES/DRESSINGS) ×1
APL SKNCLS STERI-STRIP NONHPOA (GAUZE/BANDAGES/DRESSINGS) ×1
BAG COUNTER SPONGE SURGICOUNT (BAG) ×1 IMPLANT
BAG DECANTER FOR FLEXI CONT (MISCELLANEOUS) ×1 IMPLANT
BAG SPNG CNTER NS LX DISP (BAG) ×2
BENZOIN TINCTURE PRP APPL 2/3 (GAUZE/BANDAGES/DRESSINGS) ×1 IMPLANT
BLADE CLIPPER SURG (BLADE) IMPLANT
CANISTER SUCT 3000ML PPV (MISCELLANEOUS) ×1 IMPLANT
DERMABOND ADVANCED .7 DNX12 (GAUZE/BANDAGES/DRESSINGS) ×1 IMPLANT
DRAPE LAPAROTOMY 100X72X124 (DRAPES) ×1 IMPLANT
DRAPE SURG 17X23 STRL (DRAPES) IMPLANT
DRSG OPSITE POSTOP 4X8 (GAUZE/BANDAGES/DRESSINGS) IMPLANT
ELECT REM PT RETURN 9FT ADLT (ELECTROSURGICAL) ×1
ELECTRODE REM PT RTRN 9FT ADLT (ELECTROSURGICAL) ×1 IMPLANT
EVACUATOR 1/8 PVC DRAIN (DRAIN) IMPLANT
GAUZE 4X4 16PLY ~~LOC~~+RFID DBL (SPONGE) IMPLANT
GAUZE SPONGE 4X4 12PLY STRL (GAUZE/BANDAGES/DRESSINGS) ×1 IMPLANT
GLOVE ECLIPSE 9.0 STRL (GLOVE) ×1 IMPLANT
GLOVE EXAM NITRILE XL STR (GLOVE) IMPLANT
GOWN STRL REUS W/ TWL LRG LVL3 (GOWN DISPOSABLE) IMPLANT
GOWN STRL REUS W/ TWL XL LVL3 (GOWN DISPOSABLE) ×1 IMPLANT
GOWN STRL REUS W/TWL 2XL LVL3 (GOWN DISPOSABLE) IMPLANT
GOWN STRL REUS W/TWL LRG LVL3 (GOWN DISPOSABLE)
GOWN STRL REUS W/TWL XL LVL3 (GOWN DISPOSABLE) ×1
KIT BASIN OR (CUSTOM PROCEDURE TRAY) ×1 IMPLANT
KIT TURNOVER KIT B (KITS) ×1 IMPLANT
NDL HYPO 25X1 1.5 SAFETY (NEEDLE) IMPLANT
NEEDLE HYPO 25X1 1.5 SAFETY (NEEDLE) IMPLANT
NS IRRIG 1000ML POUR BTL (IV SOLUTION) ×1 IMPLANT
PACK LAMINECTOMY NEURO (CUSTOM PROCEDURE TRAY) ×1 IMPLANT
PENCIL BUTTON HOLSTER BLD 10FT (ELECTRODE) IMPLANT
SPONGE SURGIFOAM ABS GEL 100 (HEMOSTASIS) IMPLANT
SPONGE SURGIFOAM ABS GEL SZ50 (HEMOSTASIS) ×1 IMPLANT
STRIP CLOSURE SKIN 1/2X4 (GAUZE/BANDAGES/DRESSINGS) ×1 IMPLANT
SUT VIC AB 0 CT1 18XCR BRD8 (SUTURE) ×1 IMPLANT
SUT VIC AB 0 CT1 8-18 (SUTURE)
SUT VIC AB 2-0 CT1 18 (SUTURE) ×1 IMPLANT
SWAB COLLECTION DEVICE MRSA (MISCELLANEOUS) ×1 IMPLANT
SWAB CULTURE ESWAB REG 1ML (MISCELLANEOUS) ×1 IMPLANT
TOWEL GREEN STERILE (TOWEL DISPOSABLE) ×1 IMPLANT
TOWEL GREEN STERILE FF (TOWEL DISPOSABLE) ×1 IMPLANT
WATER STERILE IRR 1000ML POUR (IV SOLUTION) ×1 IMPLANT

## 2022-04-09 NOTE — Transfer of Care (Signed)
Immediate Anesthesia Transfer of Care Note  Patient: Leonard Ramos  Procedure(s) Performed: Reexploration of Lumbar wound to remove hematoma  Patient Location: PACU  Anesthesia Type:General  Level of Consciousness: sedated  Airway & Oxygen Therapy: Patient Spontanous Breathing and Patient connected to nasal cannula oxygen  Post-op Assessment: Report given to RN and Post -op Vital signs reviewed and stable  Post vital signs: Reviewed and stable  Last Vitals:  Vitals Value Taken Time  BP 106/79 04/09/22 1731  Temp 98   Pulse 86 04/09/22 1733  Resp 16 04/09/22 1734  SpO2 99 % 04/09/22 1733  Vitals shown include unvalidated device data.  Last Pain:  Vitals:   04/09/22 1045  TempSrc:   PainSc: 7          Complications: No notable events documented.

## 2022-04-09 NOTE — Transfer of Care (Signed)
Immediate Anesthesia Transfer of Care Note  Patient: Leonard Ramos  Procedure(s) Performed: Lumbar Two-Three, Lumbar Three-Four, Lumbar Four-Five Laminectomy and Foraminotomy (Spine Lumbar)  Patient Location: PACU  Anesthesia Type:General  Level of Consciousness: awake and patient cooperative  Airway & Oxygen Therapy: Patient Spontanous Breathing and Patient connected to nasal cannula oxygen  Post-op Assessment: Report given to RN and Post -op Vital signs reviewed and stable  Post vital signs: Reviewed and stable  Last Vitals:  Vitals Value Taken Time  BP 135/89 04/09/22 1018  Temp    Pulse 78 04/09/22 1020  Resp 23 04/09/22 1020  SpO2 93 % 04/09/22 1020  Vitals shown include unvalidated device data.  Last Pain:  Vitals:   04/09/22 0648  TempSrc:   PainSc: 0-No pain         Complications: No notable events documented.

## 2022-04-09 NOTE — H&P (Signed)
Leonard Ramos is an 66 y.o. male.   Chief Complaint: Back pain HPI: 66 year old male with back and bilateral lower extremity pain numbness and weakness failing conservative management consistent with neurogenic claudication.  Work-up demonstrates evidence of significant spinal stenosis at L2-3, L3-4 and L4-5.  Patient presents now for multilevel lumbar decompressive surgery in hopes improving symptoms.  Past Medical History:  Diagnosis Date   Alcohol use    Anxiety    Arthritis    GERD (gastroesophageal reflux disease)    Hyperlipidemia    Liver disease    Neuropathy    hands tingling and feet numbness   Periodontitis, chronic, generalized    Substance abuse (HCC)    Tobacco use    Vitamin D deficiency 03/2019    Past Surgical History:  Procedure Laterality Date   ANTERIOR CERVICAL DECOMP/DISCECTOMY FUSION N/A 02/28/2021   Procedure: Anterior Cervical Discectomy Fusion - Cervical three-Cervical four - Cervical four-Cervical five - Cervical five-Cervical six;  Surgeon: Julio Sicks, MD;  Location: [facility] OR;  Service: Neurosurgery;  Laterality: N/A;   MULTIPLE EXTRACTIONS WITH ALVEOLOPLASTY Bilateral 09/04/2019   Procedure: MULTIPLE EXTRACTION W/ ALVEOLOPLASTY, Removal of left maxillary buccal exostosis;  Surgeon: Ocie Doyne, DDS;  Location: [facility] OR;  Service: Oral Surgery;  Laterality: Bilateral;    Family History  Problem Relation Age of Onset   CAD Father    Social History:  reports that he has been smoking cigarettes. He has a 40.00 pack-year smoking history. He has never used smokeless tobacco. He reports current alcohol use of about 40.0 standard drinks of alcohol per week. He reports current drug use. Drugs: Marijuana and Cocaine.  Allergies: No Known Allergies  Medications Prior to Admission  Medication Sig Dispense Refill   aspirin 81 MG chewable tablet Chew 81 mg by mouth in the morning.     gabapentin (NEURONTIN) 600 MG tablet Take 1 tablet (600 mg total) by mouth 3  (three) times daily. 270 tablet 3   omeprazole (PRILOSEC) 20 MG capsule Take 1 capsule (20 mg total) by mouth daily. 30 capsule 0   pravastatin (PRAVACHOL) 20 MG tablet TAKE 1 TABLET (20 MG TOTAL) BY MOUTH DAILY. 90 tablet 3   thiamine (VITAMIN B-1) 100 MG tablet Take 1 tablet (100 mg total) by mouth daily. 30 tablet 6   traZODone (DESYREL) 50 MG tablet Take 0.5-1 tablets (25-50 mg total) by mouth at bedtime as needed for sleep. 30 tablet 3   folic acid (FOLVITE) 1 MG tablet Take 1 tablet (1 mg total) by mouth daily. 90 tablet 3    No results found for this or any previous visit (from the past 48 hour(s)). No results found.  Pertinent items noted in HPI and remainder of comprehensive ROS otherwise negative.  Blood pressure (!) 121/95, pulse (!) 109, temperature 98.2 F (36.8 C), temperature source Oral, resp. rate 18, height 5\' 11"  (1.803 m), weight 64 kg, SpO2 95 %.  Patient is awake and alert.  He is oriented and appropriate.  Speech is fluent.  Judgment insight are intact.  Cranial nerve function normal.  Motor examination reveals intact motor strength bilaterally with some increased tone in both upper and lower extremities.  Reflexes are hyperactive.  Weak Hoffmann's responses in both hands.  Gait is unsteady.  Sensory examination with some patchy distal sensory loss in both upper and lower extremities.  Examination head ears eyes nose and throat is unremarkable.  Chest and abdomen are benign.  Extremities are free from injury deformity.  Assessment/Plan Lumbar stenosis with neurogenic claudication.  Plan L2-3, L3-4, L4-5 decompressive laminectomy with foraminotomies.  Risks and benefits of been explained.  Patient wishes to proceed.  Sherilyn Cooter A Donalee Gaumond 04/09/2022, 7:51 AM

## 2022-04-09 NOTE — Brief Op Note (Signed)
04/09/2022  5:13 PM  PATIENT:  Leonard Ramos  66 y.o. male  PRE-OPERATIVE DIAGNOSIS:  Post-op Bleeding  POST-OPERATIVE DIAGNOSIS:  Post-op Bleeding  PROCEDURE:  Procedure(s): Reexploration of Lumbar wound to remove hematoma (N/A)  SURGEON:  Surgeon(s) and Role:    Julio Sicks, MD - Primary  PHYSICIAN ASSISTANT:   ASSISTANTS:    ANESTHESIA:   general  EBL:  200 mL   BLOOD ADMINISTERED:none  DRAINS: (Medium) Hemovact drain(s) in the deep wound space with  Suction Open   LOCAL MEDICATIONS USED:  NONE  SPECIMEN:  No Specimen  DISPOSITION OF SPECIMEN:  N/A  COUNTS:  YES  TOURNIQUET:  * No tourniquets in log *  DICTATION: .Dragon Dictation  PLAN OF CARE: Admit for overnight observation  PATIENT DISPOSITION:  PACU - hemodynamically stable.   Delay start of Pharmacological VTE agent (>24hrs) due to surgical blood loss or risk of bleeding: yes

## 2022-04-09 NOTE — Op Note (Signed)
Date of procedure: 04/09/2022  Date of dictation: Same  Service: Neurosurgery  Preoperative diagnosis: Postoperative lumbar epidural hematoma  Postoperative diagnosis: Same  Procedure Name: Reexplore the lumbar wound with evacuation of hematoma  Surgeon:Maylynn Orzechowski A.Brewster Wolters, M.D.  Asst. Surgeon: Doran Durand, NP  Anesthesia: General  Indication: 67 year old male status post multilevel lumbar decompressive laminectomy earlier today.  Initially the patient did well but over the course the afternoon is developed severe back pain with radiation into his lower extremities and progressive weakness and some sensory loss.  Patient has obvious soft tissue swelling in his lumbar spine and is felt to have most likely a postoperative epidural hemorrhage.  He is taken emergently to the operating room for evacuation of hemorrhage.  Operative note: After induction of anesthesia, patient position prone onto Wilson frame and appropriately padded.  Lumbar region prepped and draped sterilely.  Patient's lumbar wound was reopened.  Upon opening the fascia there was an obvious deep wound space clot which was compressive.  This was removed with blunt dissection and suction.  A self-retaining tractor was placed.  There was an arterial bleeding vessel on the wall of the paraspinal muscles which is the most likely cause of the hemorrhage.  This was coagulated using bipolar cautery.  The hemorrhage was completely evacuated from the epidural space.  There was no evidence of any tracking either cephalad or caudally.  There was no evidence of injury to the thecal sac or nerve roots.  The wound was then copiously irrigated.  There is no evidence of further hemorrhage.  A medium Hemovac drain was left in the epidural space.  Wound was then closed in layers with Vicryl sutures.  Steri-Strips and sterile dressing were applied.  No apparent complications.  Patient tolerated the procedure well and he returns to the recovery room postop.

## 2022-04-09 NOTE — Anesthesia Procedure Notes (Signed)
Procedure Name: Intubation Date/Time: 04/09/2022 8:10 AM  Performed by: Griffin Dakin, CRNAPre-anesthesia Checklist: Patient identified, Emergency Drugs available, Suction available and Patient being monitored Patient Re-evaluated:Patient Re-evaluated prior to induction Oxygen Delivery Method: Circle system utilized Preoxygenation: Pre-oxygenation with 100% oxygen Induction Type: IV induction Ventilation: Mask ventilation without difficulty Laryngoscope Size: Mac and 4 Grade View: Grade I Tube type: Oral Tube size: 7.5 mm Number of attempts: 1 Airway Equipment and Method: Stylet and Oral airway Placement Confirmation: ETT inserted through vocal cords under direct vision, positive ETCO2 and breath sounds checked- equal and bilateral Secured at: 22 cm Tube secured with: Tape Dental Injury: Teeth and Oropharynx as per pre-operative assessment  Comments: SRNA K Venrick

## 2022-04-09 NOTE — Op Note (Signed)
Date of procedure: 04/09/2022  Date of dictation: Same  Service: Neurosurgery  Preoperative diagnosis: Lumbar stenosis with neurogenic claudication  Postoperative diagnosis: Same  Procedure Name: L2-3, L3-4, L4-5 decompressive laminectomy with foraminotomies  Surgeon:Doral Ventrella A.Laquenta Whitsell, M.D.  Asst. Surgeon: Doran Durand, NP  Anesthesia: General  Indication: 66 year old male with history of prior anterior cervical decompression and fusion for treatment of a compressive cervical myelopathy presents with increasing back and bilateral lower extremity pain numbness and weakness consistent with neurogenic claudication.  Work-up demonstrates evidence of severe multilevel lumbar spondylosis with severe stenosis.  Patient presents now for decompressive surgery.  Operative note: After induction of anesthesia, patient position prone on the Wilson frame and appropriately padded.  Lumbar region prepped and draped sterilely.  Incision made overlying L3-4.  Dissection performed bilaterally.  Retractor placed.  X-ray taken and the levels were confirmed starting at L4-5.  Decompressive laminectomy was then performed using Leksell rongeurs, Kerrison rongeurs and high-speed drill to remove the lamina of L3 and L4 and the inferior aspect of the lamina of L2 and the superior aspect lamina of L5.  High-speed drill and Kerrison rongeurs was then used to remove the lamina of L3 and L4 completely in the inferior two thirds of L2 and the superior aspect of the L5 lamina were removed.  Medial facetectomies were performed bilaterally.  Ligament flavum elevated and resected.  Foraminotomies completed on the course exiting L2, L3, L4 and L5 nerve roots.  At this point a very thorough decompression of been achieved.  There was no evidence of injury to the thecal sac or nerve roots.  Gelfoam was placed topically for hemostasis.  Vancomycin powder was placed in deep wound space.  Wound was then closed in layers with Vicryl sutures.   Steri-Strips and sterile dressing were applied.  No apparent complications.  Patient tolerated the procedure well and he returns to the recovery room postop.

## 2022-04-09 NOTE — Anesthesia Postprocedure Evaluation (Signed)
Anesthesia Post Note  Patient: GAYLORD SEYDEL  Procedure(s) Performed: Lumbar Two-Three, Lumbar Three-Four, Lumbar Four-Five Laminectomy and Foraminotomy (Spine Lumbar)     Patient location during evaluation: PACU Anesthesia Type: General Level of consciousness: awake and alert Pain management: pain level controlled Vital Signs Assessment: post-procedure vital signs reviewed and stable Respiratory status: spontaneous breathing, nonlabored ventilation, respiratory function stable and patient connected to nasal cannula oxygen Cardiovascular status: blood pressure returned to baseline and stable Postop Assessment: no apparent nausea or vomiting Anesthetic complications: no   No notable events documented.  Last Vitals:  Vitals:   04/09/22 1123 04/09/22 1542  BP: 97/66 (!) 117/91  Pulse: 66 87  Resp: 18 18  Temp: 36.4 C (!) 36.4 C  SpO2: 99% 98%                Shelton Silvas

## 2022-04-09 NOTE — Anesthesia Preprocedure Evaluation (Signed)
Anesthesia Evaluation  Patient identified by MRN, date of birth, ID band Patient awake    Reviewed: Allergy & Precautions, NPO status , Patient's Chart, lab work & pertinent test results  Airway Mallampati: I  TM Distance: >3 FB Neck ROM: Full    Dental  (+) Edentulous Upper, Edentulous Lower   Pulmonary Current Smoker and Patient abstained from smoking.   breath sounds clear to auscultation       Cardiovascular  Rhythm:Regular Rate:Normal     Neuro/Psych  PSYCHIATRIC DISORDERS Anxiety Depression       GI/Hepatic Neg liver ROS,GERD  ,,  Endo/Other  negative endocrine ROS    Renal/GU negative Renal ROS     Musculoskeletal  (+) Arthritis ,    Abdominal Normal abdominal exam  (+)   Peds  Hematology negative hematology ROS (+)   Anesthesia Other Findings   Reproductive/Obstetrics                              Anesthesia Physical Anesthesia Plan  ASA: 2 and emergent  Anesthesia Plan: General   Post-op Pain Management:    Induction: Intravenous, Rapid sequence and Cricoid pressure planned  PONV Risk Score and Plan: 2 and Ondansetron, Dexamethasone and Midazolam  Airway Management Planned: Oral ETT  Additional Equipment: None  Intra-op Plan:   Post-operative Plan: Extubation in OR  Informed Consent: I have reviewed the patients History and Physical, chart, labs and discussed the procedure including the risks, benefits and alternatives for the proposed anesthesia with the patient or authorized representative who has indicated his/her understanding and acceptance.       Plan Discussed with: CRNA  Anesthesia Plan Comments: (PAT note written 04/02/2022 by Shonna Chock, PA-C.  )        Anesthesia Quick Evaluation

## 2022-04-09 NOTE — Anesthesia Procedure Notes (Signed)
Procedure Name: Intubation Date/Time: 04/09/2022 4:40 PM  Performed by: Minerva Ends, CRNAPre-anesthesia Checklist: Patient identified, Emergency Drugs available, Suction available and Patient being monitored Patient Re-evaluated:Patient Re-evaluated prior to induction Oxygen Delivery Method: Circle system utilized Preoxygenation: Pre-oxygenation with 100% oxygen Induction Type: IV induction, Rapid sequence and Cricoid Pressure applied Ventilation: Mask ventilation without difficulty Laryngoscope Size: Mac and 4 Grade View: Grade I Tube type: Oral Tube size: 7.0 mm Number of attempts: 1 Airway Equipment and Method: Stylet and Oral airway Placement Confirmation: ETT inserted through vocal cords under direct vision, positive ETCO2 and breath sounds checked- equal and bilateral Secured at: 23 cm Tube secured with: Tape Dental Injury: Teeth and Oropharynx as per pre-operative assessment

## 2022-04-09 NOTE — Progress Notes (Signed)
The patient underwent uncomplicated lumbar decompressive surgery earlier today.  He had been doing well on the floor progressing.  The patient began having severe back pain with radiation into his left lower extremity.  The patient has severe pain and has some progressive weakness in both lower extremities now.  He has swelling around his wound.  On examination he has pain with straight raising bilaterally.  He has good proximal strength but has diminished dorsiflexion bilaterally.  His wound is swollen.  I believe the patient has a postoperative deep wound hematoma with compression of his thecal sac and cauda equina.  I recommended we move forward with emergent reexploration of his lumbar wound and evacuation of hematoma.  The patient is aware and agrees to proceed.

## 2022-04-09 NOTE — Brief Op Note (Signed)
04/09/2022  10:07 AM  PATIENT:  Leonard Ramos  66 y.o. male  PRE-OPERATIVE DIAGNOSIS:  Stenosis  POST-OPERATIVE DIAGNOSIS:  Stenosis  PROCEDURE:  Procedure(s): Lumbar Two-Three, Lumbar Three-Four, Lumbar Four-Five Laminectomy and Foraminotomy (N/A)  SURGEON:  Surgeon(s) and Role:    * Julio Sicks, MD - Primary  PHYSICIAN ASSISTANT:   ASSISTANTSMarland Mcalpine   ANESTHESIA:   general  EBL:  100cc   BLOOD ADMINISTERED:none  DRAINS: none   LOCAL MEDICATIONS USED:  MARCAINE     SPECIMEN:  No Specimen  DISPOSITION OF SPECIMEN:  N/A  COUNTS:  YES  TOURNIQUET:  * No tourniquets in log *  DICTATION: .Dragon Dictation  PLAN OF CARE: Admit for overnight observation  PATIENT DISPOSITION:  PACU - hemodynamically stable.   Delay start of Pharmacological VTE agent (>24hrs) due to surgical blood loss or risk of bleeding: yes

## 2022-04-10 ENCOUNTER — Encounter (HOSPITAL_COMMUNITY): Payer: Self-pay | Admitting: Neurosurgery

## 2022-04-10 ENCOUNTER — Other Ambulatory Visit: Payer: Self-pay

## 2022-04-10 DIAGNOSIS — M48062 Spinal stenosis, lumbar region with neurogenic claudication: Secondary | ICD-10-CM | POA: Diagnosis not present

## 2022-04-10 MED ORDER — DOCUSATE SODIUM 100 MG PO CAPS
100.0000 mg | ORAL_CAPSULE | Freq: Two times a day (BID) | ORAL | 0 refills | Status: DC
  Filled 2022-04-10: qty 100, 50d supply, fill #0

## 2022-04-10 MED ORDER — CYCLOBENZAPRINE HCL 10 MG PO TABS
10.0000 mg | ORAL_TABLET | Freq: Three times a day (TID) | ORAL | 0 refills | Status: DC | PRN
  Filled 2022-04-10: qty 30, 10d supply, fill #0

## 2022-04-10 MED ORDER — HYDROCODONE-ACETAMINOPHEN 5-325 MG PO TABS
1.0000 | ORAL_TABLET | ORAL | 0 refills | Status: AC | PRN
  Filled 2022-04-10: qty 30, 3d supply, fill #0

## 2022-04-10 NOTE — Progress Notes (Signed)
Patient alert and oriented, void, drain removed per order minimal sanguineous, dressing applied. Surgical site clean and dry no sign of infection.D/c instruction explain and copy given to the patient all questions answered. Pt. D/c home per order.

## 2022-04-10 NOTE — Discharge Instructions (Addendum)
Wound Care Keep incision covered and dry for two days.    Do not put any creams, lotions, or ointments on incision. Leave steri-strips on back.  They will fall off by themselves. You are fine to shower. Let water run over incision and pat dry.  Activity Walk each and every day, increasing distance each day. No lifting greater than 5 lbs.  Avoid excessive back motion. No driving for 2 weeks; may ride as a passenger locally.  Diet Resume your normal diet.   Return to Work Will be discussed at your follow up appointment.  Call Your Doctor If Any of These Occur Redness, drainage, or swelling at the wound.  Temperature greater than 101 degrees. Severe pain not relieved by pain medication. Incision starts to come apart.  Follow Up Appt Call [phone] today for appointment in 2 weeks if you don't already have one or for any problems. 

## 2022-04-10 NOTE — Discharge Summary (Signed)
Physician Discharge Summary     Providing Compassionate, Quality Care - Together   Patient ID: Leonard Ramos MRN: [id number] DOB/AGE: 10/18/1955 66 y.o.  Admit date: 04/09/2022 Discharge date: 04/10/2022  Admission Diagnoses:  Discharge Diagnoses:  Principal Problem:   Lumbar stenosis with neurogenic claudication   Discharged Condition: good  Hospital Course: Patient underwent an L2-3, L3-4, L4-5 decompressive laminectomy with foraminotomies by Dr. Jordan Likes on 04/09/2022. He was admitted to 3C02  following recovery from anesthesia in the PACU. His postoperative course was complicated by a postoperative lumbar epidural hematoma. He was taken back to the operating room where this was evacuated.He has worked with both physical and occupational therapies who feel the patient is ready for discharge home. He is ambulating independently and without difficulty. He is tolerating a normal diet. He is not having any bowel or bladder dysfunction. His pain is well-controlled with oral pain medication. He is ready for discharge home.   Consults: PT/OT  Significant Diagnostic Studies: radiology: DG Lumbar Spine 1 View  Result Date: 04/09/2022 CLINICAL DATA:  Elective surgery, intraop imaging for localization EXAM: LUMBAR SPINE - 1 VIEW COMPARISON:  MRI lumbar spine 02/17/2022 FINDINGS: Intraoperative lateral radiograph for localization. Instrument localization is at the level of L4-L5. There is multilevel degenerative disc disease and facet arthropathy. Grade 1 anterolisthesis at L4-L5. IMPRESSION: Instrument localization at L4-L5. Electronically Signed   By: Caprice Renshaw M.D.   On: 04/09/2022 11:43     Treatments: surgery:  1)  L2-3, L3-4, L4-5 decompressive laminectomy with foraminotomies 04/09/2022 at 10:08 AM  2) Reexploration of the lumbar wound with evacuation of hematoma 04/09/2022 at 5:15 PM  Discharge Exam: Blood pressure 109/82, pulse 93, temperature 98.1 F (36.7 C), temperature  source Oral, resp. rate 17, height 5\' 11"  (1.803 m), weight 64 kg, SpO2 99 %.  Alert and oriented x 4 PERRLA CN II-XII grossly intact MAE, Strength and sensation intact Incision is covered with Honeycomb dressing and Steri Strips; Dressing is clean, dry, and intact   Disposition: Discharge disposition: 01-Home or Self Care       Discharge Instructions     Call MD for:  difficulty breathing, headache or visual disturbances   Complete by: As directed    Call MD for:  hives   Complete by: As directed    Call MD for:  persistant nausea and vomiting   Complete by: As directed    Call MD for:  redness, tenderness, or signs of infection (pain, swelling, redness, odor or green/yellow discharge around incision site)   Complete by: As directed    Call MD for:  severe uncontrolled pain   Complete by: As directed    Call MD for:  temperature >100.4   Complete by: As directed    Diet - low sodium heart healthy   Complete by: As directed    Increase activity slowly   Complete by: As directed    Remove dressing in 48 hours   Complete by: As directed       Allergies as of 04/10/2022   No Known Allergies      Medication List     TAKE these medications    aspirin 81 MG chewable tablet Chew 81 mg by mouth in the morning.   cyclobenzaprine 10 MG tablet Commonly known as: FLEXERIL Take 1 tablet (10 mg total) by mouth 3 (three) times daily as needed for muscle spasms.   docusate sodium 100 MG capsule Commonly known as: Colace Take 1 capsule (100  mg total) by mouth 2 (two) times daily for 10 days.   folic acid 1 MG tablet Commonly known as: FOLVITE Take 1 tablet (1 mg total) by mouth daily.   gabapentin 600 MG tablet Commonly known as: NEURONTIN Take 1 tablet (600 mg total) by mouth 3 (three) times daily.   HYDROcodone-acetaminophen 5-325 MG tablet Commonly known as: NORCO/VICODIN Take 1-2 tablets by mouth every 4 (four) hours as needed for moderate pain or severe pain  ((score 4 to 6)).   omeprazole 20 MG capsule Commonly known as: PRILOSEC Take 1 capsule (20 mg total) by mouth daily.   pravastatin 20 MG tablet Commonly known as: PRAVACHOL TAKE 1 TABLET (20 MG TOTAL) BY MOUTH DAILY.   thiamine 100 MG tablet Commonly known as: Vitamin B-1 Take 1 tablet (100 mg total) by mouth daily.   traZODone 50 MG tablet Commonly known as: DESYREL Take 0.5-1 tablets (25-50 mg total) by mouth at bedtime as needed for sleep.        Follow-up Information     Julio Sicks, MD. Go on 04/19/2022.   Specialty: Neurosurgery Why: First post op appointment is on 04/19/2022 at 2:15 PM. Contact information: [address] [place] [address] [phone]                 Signed: Val Eagle, DNP, AGNP-C Nurse Practitioner  [facility] [address], [address], [place], [place] [address] P: [phone]    F: [phone]  04/10/2022, 9:50 AM

## 2022-04-10 NOTE — Anesthesia Postprocedure Evaluation (Signed)
Anesthesia Post Note  Patient: Leonard Ramos  Procedure(s) Performed: Reexploration of Lumbar wound to remove hematoma     Patient location during evaluation: PACU Anesthesia Type: General Level of consciousness: awake and alert Pain management: pain level controlled Vital Signs Assessment: post-procedure vital signs reviewed and stable Respiratory status: spontaneous breathing, nonlabored ventilation, respiratory function stable and patient connected to nasal cannula oxygen Cardiovascular status: blood pressure returned to baseline and stable Postop Assessment: no apparent nausea or vomiting Anesthetic complications: no   No notable events documented.  Last Vitals:  Vitals:   04/10/22 0111 04/10/22 0543  BP: 106/62 109/82  Pulse: 91 93  Resp:  17  Temp:  36.7 C  SpO2:  99%                Shelton Silvas

## 2022-04-10 NOTE — Plan of Care (Signed)

## 2022-04-10 NOTE — Evaluation (Signed)
Occupational Therapy Evaluation Patient Details Name: Leonard Ramos MRN: [id number] DOB: 05/03/1956 Today's Date: 04/10/2022   History of Present Illness 66 yo male who underwent  L2-3, L3-4, L4-5 decompressive laminectomy with foraminotomies on 11/20. His postoperative lumbar epidural hematoma, taken back to OR 11/20 for evacuation. PMHx: etoh, anxiety, GERD, neuropathy, vitamin D deficiency, HLD   Clinical Impression   Leonard Ramos was evaluated s/p the above back surgery, he reports being indep at baseline. Normally he lives alone, however he plans to d/c to his mothers house for 24/7 assist initially. Upon evaluation pt had functional deficits due to expected surgical pain and knowledge of back precautions. He required cues throughout to use compensatory techniques to maintain back precautions.  Overall he required generalized superivsion for all ADLs and mobility without AD. He does not need further OT. Recommend d/c to home with support of family.      Recommendations for follow up therapy are one component of a multi-disciplinary discharge planning process, led by the attending physician.  Recommendations may be updated based on patient status, additional functional criteria and insurance authorization.   Follow Up Recommendations  No OT follow up     Assistance Recommended at Discharge PRN  Patient can return home with the following A little help with walking and/or transfers;Assist for transportation;Assistance with cooking/housework    Functional Status Assessment  Patient has had a recent decline in their functional status and demonstrates the ability to make significant improvements in function in a reasonable and predictable amount of time.  Equipment Recommendations  None recommended by OT    Recommendations for Other Services       Precautions / Restrictions Precautions Precautions: Fall;Back Precaution Booklet Issued: Yes (comment) Required Braces or Orthoses:  (no  brace needed) Restrictions Weight Bearing Restrictions: No      Mobility Bed Mobility Overal bed mobility: Needs Assistance Bed Mobility: Rolling, Sidelying to Sit Rolling: Supervision Sidelying to sit: Supervision            Transfers Overall transfer level: Needs assistance Equipment used: None Transfers: Sit to/from Stand Sit to Stand: Supervision           General transfer comment: cues for hand placement      Balance Overall balance assessment: Needs assistance Sitting-balance support: Feet supported Sitting balance-Leahy Scale: Good     Standing balance support: No upper extremity supported, During functional activity Standing balance-Leahy Scale: Fair                             ADL either performed or assessed with clinical judgement   ADL Overall ADL's : Needs assistance/impaired                                     Functional mobility during ADLs: Supervision/safety General ADL Comments: geenralized supervison A with cues to maintain compensatory techniques. No AD used.     Vision Baseline Vision/History: 0 No visual deficits Vision Assessment?: No apparent visual deficits     Perception Perception Perception Tested?: No   Praxis Praxis Praxis tested?: Within functional limits    Pertinent Vitals/Pain Pain Assessment Pain Assessment: Faces Faces Pain Scale: Hurts a little bit Pain Location: surgical Pain Descriptors / Indicators: Discomfort Pain Intervention(s): Limited activity within patient's tolerance, Monitored during session     Hand Dominance Right   Extremity/Trunk Assessment Upper Extremity Assessment Upper  Extremity Assessment: Overall WFL for tasks assessed   Lower Extremity Assessment Lower Extremity Assessment: Generalized weakness   Cervical / Trunk Assessment Cervical / Trunk Assessment: Back Surgery   Communication Communication Communication: No difficulties   Cognition  Arousal/Alertness: Awake/alert Behavior During Therapy: WFL for tasks assessed/performed Overall Cognitive Status: Within Functional Limits for tasks assessed                                 General Comments: required cues throughout for carryover of back precautions     General Comments  VSS on RA            Home Living Family/patient expects to be discharged to:: Private residence Living Arrangements: Parent;Other relatives Available Help at Discharge: Family;Available 24 hours/day Type of Home: House Home Access: Stairs to enter [employer] of Steps: 3 Entrance Stairs-Rails: Right;Left Home Layout: One level     Bathroom Shower/Tub: Tub/shower unit;Walk-in shower   Bathroom Toilet: Standard     Home Equipment: Rollator (4 wheels);Shower seat;Grab bars - tub/shower;BSC/3in1   Additional Comments: pt going to moms house with the above set up. Mom, brother and sister able to assist.      Prior Functioning/Environment Prior Level of Function : Independent/Modified Independent             Mobility Comments: ambulated with rollator ADLs Comments: mod I        OT Problem List: Decreased activity tolerance;Decreased safety awareness;Decreased knowledge of use of DME or AE;Decreased knowledge of precautions;Pain         OT Goals(Current goals can be found in the care plan section) Acute Rehab OT Goals Patient Stated Goal: home OT Goal Formulation: With patient Time For Goal Achievement: 04/24/22 Potential to Achieve Goals: Good   AM-PAC OT "6 Clicks" Daily Activity     Outcome Measure Help from another person eating meals?: None Help from another person taking care of personal grooming?: A Little Help from another person toileting, which includes using toliet, bedpan, or urinal?: A Little Help from another person bathing (including washing, rinsing, drying)?: A Little Help from another person to put on and taking off regular upper  body clothing?: None Help from another person to put on and taking off regular lower body clothing?: A Little 6 Click Score: 20   End of Session Nurse Communication: Mobility status  Activity Tolerance: Patient tolerated treatment well Patient left: in bed;with call bell/phone within reach  OT Visit Diagnosis: Unsteadiness on feet (R26.81);Other abnormalities of gait and mobility (R26.89);Muscle weakness (generalized) (M62.81)                Time: 1610-9604 OT Time Calculation (min): 14 min Charges:  OT General Charges $OT Visit: 1 Visit OT Evaluation $OT Eval Moderate Complexity: 1 Mod    Angelo Caroll D Causey 04/10/2022, 10:49 AM

## 2022-04-27 ENCOUNTER — Ambulatory Visit (INDEPENDENT_AMBULATORY_CARE_PROVIDER_SITE_OTHER): Payer: Medicare Other | Admitting: Nurse Practitioner

## 2022-04-27 ENCOUNTER — Encounter: Payer: Self-pay | Admitting: Nurse Practitioner

## 2022-04-27 VITALS — BP 101/75 | HR 107 | Temp 97.3°F | Ht 71.5 in | Wt 139.0 lb

## 2022-04-27 DIAGNOSIS — G629 Polyneuropathy, unspecified: Secondary | ICD-10-CM | POA: Diagnosis not present

## 2022-04-27 DIAGNOSIS — L84 Corns and callosities: Secondary | ICD-10-CM | POA: Insufficient documentation

## 2022-04-27 DIAGNOSIS — I700 Atherosclerosis of aorta: Secondary | ICD-10-CM

## 2022-04-27 DIAGNOSIS — M79605 Pain in left leg: Secondary | ICD-10-CM

## 2022-04-27 DIAGNOSIS — M545 Low back pain, unspecified: Secondary | ICD-10-CM | POA: Diagnosis not present

## 2022-04-27 DIAGNOSIS — G4700 Insomnia, unspecified: Secondary | ICD-10-CM

## 2022-04-27 DIAGNOSIS — M79604 Pain in right leg: Secondary | ICD-10-CM

## 2022-04-27 DIAGNOSIS — G8929 Other chronic pain: Secondary | ICD-10-CM

## 2022-04-27 DIAGNOSIS — Z760 Encounter for issue of repeat prescription: Secondary | ICD-10-CM

## 2022-04-27 MED ORDER — TRAZODONE HCL 50 MG PO TABS: 25.0000 mg | ORAL_TABLET | Freq: Every evening | ORAL | 3 refills | Status: DC | PRN

## 2022-04-27 MED ORDER — GABAPENTIN 600 MG PO TABS: 600.0000 mg | ORAL_TABLET | Freq: Three times a day (TID) | ORAL | 3 refills | Status: DC

## 2022-04-27 MED ORDER — OMEPRAZOLE 20 MG PO CPDR: 20.0000 mg | DELAYED_RELEASE_CAPSULE | Freq: Every day | ORAL | 0 refills | Status: AC

## 2022-04-27 MED ORDER — PRAVASTATIN SODIUM 20 MG PO TABS: ORAL_TABLET | Freq: Every day | ORAL | 3 refills | Status: DC

## 2022-04-27 NOTE — Assessment & Plan Note (Signed)
-   Ambulatory referral to Podiatry  2. Neuropathy  - gabapentin (NEURONTIN) 600 MG tablet; Take 1 tablet (600 mg total) by mouth 3 (three) times daily.  Dispense: 270 tablet; Refill: 3  3. Chronic pain of both lower extremities  - gabapentin (NEURONTIN) 600 MG tablet; Take 1 tablet (600 mg total) by mouth 3 (three) times daily.  Dispense: 270 tablet; Refill: 3  4. Chronic low back pain, unspecified back pain laterality, unspecified whether sciatica present  - gabapentin (NEURONTIN) 600 MG tablet; Take 1 tablet (600 mg total) by mouth 3 (three) times daily.  Dispense: 270 tablet; Refill: 3  5. Aortic atherosclerosis (HCC)  - pravastatin (PRAVACHOL) 20 MG tablet; TAKE 1 TABLET (20 MG TOTAL) BY MOUTH DAILY.  Dispense: 90 tablet; Refill: 3  6. Medication refill  - pravastatin (PRAVACHOL) 20 MG tablet; TAKE 1 TABLET (20 MG TOTAL) BY MOUTH DAILY.  Dispense: 90 tablet; Refill: 3  7. Insomnia, unspecified type  - traZODone (DESYREL) 50 MG tablet; Take 0.5-1 tablets (25-50 mg total) by mouth at bedtime as needed for sleep.  Dispense: 30 tablet; Refill: 3  Follow up:  Follow up in 3 months - will need labs including lipids

## 2022-04-27 NOTE — Progress Notes (Signed)
@Patient  ID: , male    DOB: April 10, 1956, 66 y.o.   MRN: [id number]  Chief Complaint  Patient presents with   surgery follow-up    Pt stated --pain level 8   Toe Pain    Pt stated--2nd toe left foot--have hard skin/corn--painful and requesting referral at [facility]    Referring provider: No ref. provider found   HPI  66 year old male with past medical history of Alcohol use, Anxiety, Hyperlipidemia, Liver disease, Neuropathy, Periodontitis, chronic, generalized, Tobacco use, and Vitamin D deficiency (03/2019).    Patient presents today for a follow-up after recent back surgery.  He states that he is in pain still.  He does have a follow-up scheduled with the surgeon.  Patient is requesting referral to podiatry for left 2nd toe - pain - callus. Denies f/c/s, n/v/d, hemoptysis, PND, leg swelling Denies chest pain or edema       No Known Allergies  Immunization History  Administered Date(s) Administered   Influenza,inj,Quad PF,6+ Mos 03/08/2015, 03/08/2016, 03/06/2017, 03/07/2018, 04/10/2019   Moderna Sars-Covid-2 Vaccination 09/28/2019   Pneumococcal Polysaccharide-23 03/08/2015   Tdap 03/08/2016    Past Medical History:  Diagnosis Date   Alcohol use    Anxiety    Arthritis    GERD (gastroesophageal reflux disease)    Hyperlipidemia    Liver disease    Neuropathy    hands tingling and feet numbness   Periodontitis, chronic, generalized    Substance abuse (HCC)    Tobacco use    Vitamin D deficiency 03/2019    Tobacco History: Social History   Tobacco Use  Smoking Status Every Day   Packs/day: 1.00   Years: 40.00   Total pack years: 40.00   Types: Cigarettes  Smokeless Tobacco Never   Ready to quit: Not Answered Counseling given: Not Answered   Outpatient Encounter Medications as of 04/27/2022  Medication Sig   aspirin 81 MG chewable tablet Chew 81 mg by mouth in the morning.   cyclobenzaprine (FLEXERIL) 10 MG tablet Take 1  tablet (10 mg total) by mouth 3 (three) times daily as needed for muscle spasms.   docusate sodium (COLACE) 100 MG capsule Take 1 capsule (100 mg total) by mouth 2 (two) times daily.   folic acid (FOLVITE) 1 MG tablet Take 1 tablet (1 mg total) by mouth daily.   HYDROcodone-acetaminophen (NORCO/VICODIN) 5-325 MG tablet Take 1-2 tablets by mouth every 4 (four) hours as needed for moderate pain or severe pain ((score 4 to 6)).   thiamine (VITAMIN B-1) 100 MG tablet Take 1 tablet (100 mg total) by mouth daily.   [DISCONTINUED] gabapentin (NEURONTIN) 600 MG tablet Take 1 tablet (600 mg total) by mouth 3 (three) times daily.   [DISCONTINUED] omeprazole (PRILOSEC) 20 MG capsule Take 1 capsule (20 mg total) by mouth daily.   [DISCONTINUED] pravastatin (PRAVACHOL) 20 MG tablet TAKE 1 TABLET (20 MG TOTAL) BY MOUTH DAILY.   [DISCONTINUED] traZODone (DESYREL) 50 MG tablet Take 0.5-1 tablets (25-50 mg total) by mouth at bedtime as needed for sleep.   gabapentin (NEURONTIN) 600 MG tablet Take 1 tablet (600 mg total) by mouth 3 (three) times daily.   omeprazole (PRILOSEC) 20 MG capsule Take 1 capsule (20 mg total) by mouth daily.   pravastatin (PRAVACHOL) 20 MG tablet TAKE 1 TABLET (20 MG TOTAL) BY MOUTH DAILY.   traZODone (DESYREL) 50 MG tablet Take 0.5-1 tablets (25-50 mg total) by mouth at bedtime as needed for sleep.  No facility-administered encounter medications on file as of 04/27/2022.     Review of Systems  Review of Systems  Constitutional: Negative.   HENT: Negative.    Cardiovascular: Negative.   Gastrointestinal: Negative.   Allergic/Immunologic: Negative.   Neurological: Negative.   Psychiatric/Behavioral: Negative.         Physical Exam  BP 101/75   Pulse (!) 107   Temp (!) 97.3 F (36.3 C)   Ht 5' 11.5" (1.816 m)   Wt 139 lb (63 kg)   SpO2 99%   BMI 19.12 kg/m   Wt Readings from Last 5 Encounters:  04/27/22 139 lb (63 kg)  04/09/22 141 lb (64 kg)  03/30/22 140 lb 8 oz  (63.7 kg)  01/26/22 133 lb 12.8 oz (60.7 kg)  12/28/21 138 lb (62.6 kg)     Physical Exam Vitals and nursing note reviewed.  Constitutional:      General: He is not in acute distress.    Appearance: He is well-developed.  Cardiovascular:     Rate and Rhythm: Normal rate and regular rhythm.  Pulmonary:     Effort: Pulmonary effort is normal.     Breath sounds: Normal breath sounds.  Skin:    General: Skin is warm and dry.  Neurological:     Mental Status: He is alert and oriented to person, place, and time.       Assessment & Plan:   Callus between toes - Ambulatory referral to Podiatry  2. Neuropathy  - gabapentin (NEURONTIN) 600 MG tablet; Take 1 tablet (600 mg total) by mouth 3 (three) times daily.  Dispense: 270 tablet; Refill: 3  3. Chronic pain of both lower extremities  - gabapentin (NEURONTIN) 600 MG tablet; Take 1 tablet (600 mg total) by mouth 3 (three) times daily.  Dispense: 270 tablet; Refill: 3  4. Chronic low back pain, unspecified back pain laterality, unspecified whether sciatica present  - gabapentin (NEURONTIN) 600 MG tablet; Take 1 tablet (600 mg total) by mouth 3 (three) times daily.  Dispense: 270 tablet; Refill: 3  5. Aortic atherosclerosis (HCC)  - pravastatin (PRAVACHOL) 20 MG tablet; TAKE 1 TABLET (20 MG TOTAL) BY MOUTH DAILY.  Dispense: 90 tablet; Refill: 3  6. Medication refill  - pravastatin (PRAVACHOL) 20 MG tablet; TAKE 1 TABLET (20 MG TOTAL) BY MOUTH DAILY.  Dispense: 90 tablet; Refill: 3  7. Insomnia, unspecified type  - traZODone (DESYREL) 50 MG tablet; Take 0.5-1 tablets (25-50 mg total) by mouth at bedtime as needed for sleep.  Dispense: 30 tablet; Refill: 3  Follow up:  Follow up in 3 months - will need labs including lipids     Fenton Foy, NP 04/27/2022

## 2022-04-27 NOTE — Patient Instructions (Addendum)
1. Callus between toes  - Ambulatory referral to Podiatry  2. Neuropathy  - gabapentin (NEURONTIN) 600 MG tablet; Take 1 tablet (600 mg total) by mouth 3 (three) times daily.  Dispense: 270 tablet; Refill: 3  3. Chronic pain of both lower extremities  - gabapentin (NEURONTIN) 600 MG tablet; Take 1 tablet (600 mg total) by mouth 3 (three) times daily.  Dispense: 270 tablet; Refill: 3  4. Chronic low back pain, unspecified back pain laterality, unspecified whether sciatica present  - gabapentin (NEURONTIN) 600 MG tablet; Take 1 tablet (600 mg total) by mouth 3 (three) times daily.  Dispense: 270 tablet; Refill: 3  5. Aortic atherosclerosis (HCC)  - pravastatin (PRAVACHOL) 20 MG tablet; TAKE 1 TABLET (20 MG TOTAL) BY MOUTH DAILY.  Dispense: 90 tablet; Refill: 3  6. Medication refill  - pravastatin (PRAVACHOL) 20 MG tablet; TAKE 1 TABLET (20 MG TOTAL) BY MOUTH DAILY.  Dispense: 90 tablet; Refill: 3  7. Insomnia, unspecified type  - traZODone (DESYREL) 50 MG tablet; Take 0.5-1 tablets (25-50 mg total) by mouth at bedtime as needed for sleep.  Dispense: 30 tablet; Refill: 3  Follow up:  Follow up in 3 months - will need labs including lipids

## 2022-05-02 ENCOUNTER — Ambulatory Visit (INDEPENDENT_AMBULATORY_CARE_PROVIDER_SITE_OTHER): Payer: Medicare Other | Admitting: Podiatry

## 2022-05-02 ENCOUNTER — Encounter: Payer: Self-pay | Admitting: Podiatry

## 2022-05-02 VITALS — BP 128/88 | HR 104

## 2022-05-02 DIAGNOSIS — M2042 Other hammer toe(s) (acquired), left foot: Secondary | ICD-10-CM

## 2022-05-02 DIAGNOSIS — M2010 Hallux valgus (acquired), unspecified foot: Secondary | ICD-10-CM | POA: Diagnosis not present

## 2022-05-02 DIAGNOSIS — B351 Tinea unguium: Secondary | ICD-10-CM

## 2022-05-02 DIAGNOSIS — L84 Corns and callosities: Secondary | ICD-10-CM | POA: Diagnosis not present

## 2022-05-02 DIAGNOSIS — M79675 Pain in left toe(s): Secondary | ICD-10-CM | POA: Diagnosis not present

## 2022-05-02 DIAGNOSIS — M79674 Pain in right toe(s): Secondary | ICD-10-CM

## 2022-05-02 NOTE — Progress Notes (Signed)
This patient returns to my office for at risk foot care.  This patient requires this care by a professional since this patient will be at risk due to having neuropathy.  Patient says he has very painful corn between his 1,2 toe left foot.  This is painful walking and wearing his shoes.  This patient also  is unable to cut nails himself since the patient cannot reach his nails.These nails are painful walking and wearing shoes.  This patient presents for at risk foot care today.  General Appearance  Alert, conversant and in no acute stress.  Vascular  Dorsalis pedis and posterior tibial  pulses are palpable  bilaterally.  Capillary return is within normal limits  bilaterally. Temperature is within normal limits  bilaterally.  Neurologic  Senn-Weinstein monofilament wire test within normal limits  bilaterally. Muscle power within normal limits bilaterally.  Nails Thick disfigured discolored nails with subungual debris  from hallux to fifth toes bilaterally. No evidence of bacterial infection or drainage bilaterally.  Orthopedic  No limitations of motion  feet .  No crepitus or effusions noted.  No bony pathology or digital deformities noted.  Hammer toe 2nd toe left foot.  HAV  B/L.    Skin  normotropic skin with no porokeratosis noted bilaterally.  No signs of infections or ulcers noted.     Onychomycosis  Pain in right toes  Pain in left toes  HAV  B/L  Hammer toe left foot.  Consent was obtained for treatment procedures.   Mechanical debridement of nails 1-5  bilaterally performed with a nail nipper.  Filed with dremel without incident.    Return office visit    3 months                 Told patient to return for periodic foot care and evaluation due to potential at risk complications. Patient requests a surgical consult to permanently resolve his corn issue left foot. Patient to see Dr.  Allena Katz.   Helane Gunther DPM

## 2022-05-11 ENCOUNTER — Other Ambulatory Visit: Payer: Self-pay | Admitting: Nurse Practitioner

## 2022-05-25 ENCOUNTER — Ambulatory Visit: Payer: Medicare Other | Admitting: Podiatry

## 2022-06-08 ENCOUNTER — Other Ambulatory Visit: Payer: Self-pay | Admitting: Nurse Practitioner

## 2022-06-08 DIAGNOSIS — G4700 Insomnia, unspecified: Secondary | ICD-10-CM

## 2022-06-21 ENCOUNTER — Telehealth: Payer: Self-pay | Admitting: Nurse Practitioner

## 2022-06-21 NOTE — Telephone Encounter (Signed)
I attempted to leave message for patient to call back and schedule Medicare Annual Wellness Visit (AWV) in office. Voice mail is full.  If not able to come in office, please offer to do virtually or by telephone.  Left office number and my jabber [phone].  AWVI eligible as of 06/21/2018  Please schedule at anytime with Nurse Health Advisor.

## 2022-07-27 ENCOUNTER — Ambulatory Visit: Payer: 59 | Admitting: Nurse Practitioner

## 2022-08-02 ENCOUNTER — Telehealth: Payer: Self-pay | Admitting: Nurse Practitioner

## 2022-08-02 NOTE — Telephone Encounter (Signed)
Called patient to schedule Medicare Annual Wellness Visit (AWV). Left message for patient to call back and schedule Medicare Annual Wellness Visit (AWV).  Last date of AWV:  AWVI eligible as of 06/21/2018   Please schedule an AWVI appointment at any time with Annual Wellness Visit.  If any questions, please contact me at [phone].    Thank you,  Tool Direct dial  [phone]

## 2022-08-08 ENCOUNTER — Encounter (HOSPITAL_COMMUNITY): Payer: Self-pay

## 2022-08-08 ENCOUNTER — Emergency Department (HOSPITAL_COMMUNITY)
Admission: EM | Admit: 2022-08-08 | Discharge: 2022-08-08 | Disposition: A | Discharge status: Home / Self Care | Payer: No Typology Code available for payment source | Attending: Emergency Medicine | Admitting: Emergency Medicine

## 2022-08-08 ENCOUNTER — Emergency Department (HOSPITAL_COMMUNITY): Payer: No Typology Code available for payment source

## 2022-08-08 ENCOUNTER — Ambulatory Visit: Payer: 59 | Admitting: Podiatry

## 2022-08-08 ENCOUNTER — Other Ambulatory Visit: Payer: Self-pay

## 2022-08-08 DIAGNOSIS — Y92410 Unspecified street and highway as the place of occurrence of the external cause: Secondary | ICD-10-CM | POA: Diagnosis not present

## 2022-08-08 DIAGNOSIS — Z7982 Long term (current) use of aspirin: Secondary | ICD-10-CM | POA: Insufficient documentation

## 2022-08-08 DIAGNOSIS — M545 Low back pain, unspecified: Secondary | ICD-10-CM | POA: Diagnosis not present
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
## 2022-08-09 ENCOUNTER — Telehealth: Payer: Self-pay
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
## 2022-08-16 ENCOUNTER — Encounter: Payer: Self-pay | Admitting: Nurse Practitioner

## 2022-08-16 ENCOUNTER — Ambulatory Visit (INDEPENDENT_AMBULATORY_CARE_PROVIDER_SITE_OTHER): Payer: 59 | Admitting: Nurse Practitioner

## 2022-08-16 ENCOUNTER — Other Ambulatory Visit: Payer: Self-pay

## 2022-08-16 VITALS — BP 136/94 | HR 90 | Temp 97.8°F | Ht 71.5 in | Wt 145.8 lb

## 2022-08-16 DIAGNOSIS — Z1211 Encounter for screening for malignant neoplasm of colon: Secondary | ICD-10-CM | POA: Diagnosis not present

## 2022-08-16 DIAGNOSIS — Z23 Encounter for immunization: Secondary | ICD-10-CM

## 2022-08-16 DIAGNOSIS — G8929 Other chronic pain: Secondary | ICD-10-CM

## 2022-08-16 DIAGNOSIS — R200 Anesthesia of skin: Secondary | ICD-10-CM | POA: Diagnosis not present

## 2022-08-16 DIAGNOSIS — M545 Low back pain, unspecified: Secondary | ICD-10-CM | POA: Diagnosis not present

## 2022-08-16 DIAGNOSIS — R202 Paresthesia of skin: Secondary | ICD-10-CM

## 2022-08-16 MED ORDER — SILDENAFIL CITRATE 25 MG PO TABS
25.0000 mg | ORAL_TABLET | Freq: Every day | ORAL | 0 refills | Status: DC | PRN
  Filled 2022-08-16: qty 10, 30d supply, fill #0

## 2022-08-16 MED ORDER — PREDNISONE 20 MG PO TABS
20.0000 mg | ORAL_TABLET | Freq: Every day | ORAL | 0 refills | Status: AC
  Filled 2022-08-16: qty 5, 5d supply, fill #0

## 2022-08-16 NOTE — Patient Instructions (Addendum)
1. Chronic low back pain, unspecified back pain laterality, unspecified whether sciatica present  - Ambulatory referral to Neurosurgery  2. Numbness and tingling of lower extremity  - Ambulatory referral to Neurosurgery  3. Colon cancer screening  - Cologuard  4. Need for pneumococcal vaccination  - Pneumococcal conjugate vaccine 13-valent   Follow up:  Follow up in 6 months

## 2022-08-16 NOTE — Assessment & Plan Note (Signed)
-   Ambulatory referral to Neurosurgery  2. Numbness and tingling of lower extremity  - Ambulatory referral to Neurosurgery  3. Colon cancer screening  - Cologuard  4. Need for pneumococcal vaccination  - Pneumococcal conjugate vaccine 13-valent   Follow up:  Follow up in 6 months
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
## 2022-08-20 ENCOUNTER — Other Ambulatory Visit: Payer: Self-pay

## 2022-08-22 ENCOUNTER — Other Ambulatory Visit: Payer: Self-pay

## 2022-09-18 ENCOUNTER — Telehealth: Payer: Self-pay | Admitting: Nurse Practitioner

## 2022-09-18 NOTE — Telephone Encounter (Signed)
Called patient to schedule Medicare Annual Wellness Visit (AWV). Left message for patient to call back and schedule Medicare Annual Wellness Visit (AWV).  Last date of AWV: due 06/21/2018 awvi per [place]  Please schedule an appointment at any time with Abby, NHA. .  If any questions, please contact me at [phone].  Thank you,  Judeth Cornfield,  AMB Clinical Support [facility] AWV Program Direct Dial ??[phone]

## 2022-09-18 NOTE — Telephone Encounter (Signed)
Contacted Leonard Ramos to schedule their annual wellness visit. Appointment made for 10/11/2022. Thank you,  Judeth Cornfield,  AMB Clinical Support [facility] AWV Program Direct Dial ??[phone]

## 2022-10-11 ENCOUNTER — Ambulatory Visit: Payer: 59

## 2022-10-18 ENCOUNTER — Ambulatory Visit (INDEPENDENT_AMBULATORY_CARE_PROVIDER_SITE_OTHER): Payer: 59

## 2022-10-18 ENCOUNTER — Telehealth: Payer: Self-pay

## 2022-10-18 ENCOUNTER — Other Ambulatory Visit: Payer: Self-pay | Admitting: Nurse Practitioner

## 2022-10-18 ENCOUNTER — Other Ambulatory Visit: Payer: Self-pay

## 2022-10-18 VITALS — BP 136/94 | Ht 71.5 in | Wt 145.0 lb

## 2022-10-18 DIAGNOSIS — Z0000 Encounter for general adult medical examination without abnormal findings: Secondary | ICD-10-CM

## 2022-10-18 DIAGNOSIS — Z87891 Personal history of nicotine dependence: Secondary | ICD-10-CM

## 2022-10-18 DIAGNOSIS — R2689 Other abnormalities of gait and mobility: Secondary | ICD-10-CM

## 2022-10-18 DIAGNOSIS — H9193 Unspecified hearing loss, bilateral: Secondary | ICD-10-CM

## 2022-10-18 MED ORDER — SILDENAFIL CITRATE 25 MG PO TABS
25.0000 mg | ORAL_TABLET | Freq: Every day | ORAL | 0 refills | Status: DC | PRN
  Filled 2022-10-18: qty 10, 10d supply, fill #0

## 2022-10-18 NOTE — Telephone Encounter (Signed)
Order sent in today. Gh

## 2022-10-18 NOTE — Telephone Encounter (Signed)
DME order place today. Gh

## 2022-10-18 NOTE — Progress Notes (Signed)
 I connected with  Leonard Ramos on 10/18/22 by a audio enabled telemedicine application and verified that I am speaking with the correct person using two identifiers.  Patient Location: Home  Provider Location: Home Office  I discussed the limitations of evaluation and management by telemedicine. The patient expressed understanding and agreed to proceed.  Subjective:   Leonard Ramos is a 67 y.o. male who presents for an Initial Medicare Annual Wellness Visit.  Review of Systems      Cardiac Risk Factors include: advanced age (>27men, >14 women);dyslipidemia;hypertension;male gender;smoking/ tobacco exposure;Other (see comment), Risk factor comments: history of ETOH abuse     Objective:    Today's Vitals   10/18/22 0909 10/18/22 0911  BP: (!) 136/94   Weight: 145 lb (65.8 kg)   Height: 5' 11.5" (1.816 m)   PainSc: 9  9    Body mass index is 19.94 kg/m.     10/18/2022    9:23 AM 03/30/2022    1:17 PM 02/16/2021    3:25 PM 11/10/2020   10:42 AM 09/08/2020    7:46 AM 08/19/2020   12:03 PM 07/11/2020    7:54 AM  Advanced Directives  Does Patient Have a Medical Advance Directive? No No No No No No No  Would patient like information on creating a medical advance directive? No - Patient declined No - Patient declined No - Patient declined No - Patient declined  No - Patient declined     Current Medications (verified) Outpatient Encounter Medications as of 10/18/2022  Medication Sig   folic acid (FOLVITE) 1 MG tablet Take 1 tablet (1 mg total) by mouth daily.   gabapentin (NEURONTIN) 600 MG tablet Take 1 tablet (600 mg total) by mouth 3 (three) times daily.   omeprazole (PRILOSEC) 20 MG capsule Take 1 capsule (20 mg total) by mouth daily.   pravastatin (PRAVACHOL) 20 MG tablet TAKE 1 TABLET (20 MG TOTAL) BY MOUTH DAILY.   sildenafil (VIAGRA) 25 MG tablet Take 1 tablet (25 mg total) by mouth daily as needed for erectile dysfunction.   thiamine (VITAMIN B-1) 100 MG tablet  Take 1 tablet (100 mg total) by mouth daily.   aspirin 81 MG chewable tablet Chew 81 mg by mouth in the morning. (Patient not taking: Reported on 08/16/2022)   cyclobenzaprine (FLEXERIL) 10 MG tablet Take 1 tablet (10 mg total) by mouth 3 (three) times daily as needed for muscle spasms. (Patient not taking: Reported on 08/16/2022)   docusate sodium (COLACE) 100 MG capsule Take 1 capsule (100 mg total) by mouth 2 (two) times daily. (Patient not taking: Reported on 08/09/2022)   HYDROcodone-acetaminophen (NORCO/VICODIN) 5-325 MG tablet Take 1-2 tablets by mouth every 4 (four) hours as needed for moderate pain or severe pain ((score 4 to 6)). (Patient not taking: Reported on 08/09/2022)   traZODone (DESYREL) 50 MG tablet TAKE 1/2 TO 1 TABLET BY MOUTH AT BEDTIME AS NEEDED FOR SLEEP (Patient not taking: Reported on 10/18/2022)   No facility-administered encounter medications on file as of 10/18/2022.    Allergies (verified) Patient has no known allergies.   History: Past Medical History:  Diagnosis Date   Alcohol use    Anxiety    Arthritis    GERD (gastroesophageal reflux disease)    Hyperlipidemia    Liver disease    Neuropathy    hands tingling and feet numbness   Periodontitis, chronic, generalized    Substance abuse (HCC)    Tobacco use  Vitamin D deficiency 03/2019   Past Surgical History:  Procedure Laterality Date   ANTERIOR CERVICAL DECOMP/DISCECTOMY FUSION N/A 02/28/2021   Procedure: Anterior Cervical Discectomy Fusion - Cervical three-Cervical four - Cervical four-Cervical five - Cervical five-Cervical six;  Surgeon: Julio Sicks, MD;  Location: [facility] OR;  Service: Neurosurgery;  Laterality: N/A;   LUMBAR LAMINECTOMY/DECOMPRESSION MICRODISCECTOMY N/A 04/09/2022   Procedure: Lumbar Two-Three, Lumbar Three-Four, Lumbar Four-Five Laminectomy and Foraminotomy;  Surgeon: Julio Sicks, MD;  Location: [facility] OR;  Service: Neurosurgery;  Laterality: N/A;   LUMBAR WOUND DEBRIDEMENT N/A 04/09/2022    Procedure: Reexploration of Lumbar wound to remove hematoma;  Surgeon: Julio Sicks, MD;  Location: [facility] OR;  Service: Neurosurgery;  Laterality: N/A;   MULTIPLE EXTRACTIONS WITH ALVEOLOPLASTY Bilateral 09/04/2019   Procedure: MULTIPLE EXTRACTION W/ ALVEOLOPLASTY, Removal of left maxillary buccal exostosis;  Surgeon: Ocie Doyne, DDS;  Location: [facility] OR;  Service: Oral Surgery;  Laterality: Bilateral;   Family History  Problem Relation Age of Onset   CAD Father    Social History   Socioeconomic History   Marital status: Single    Spouse name: Not on file   Number of children: 4   Years of education: Not on file   Highest education level: Not on file  Occupational History   Not on file  Tobacco Use   Smoking status: Every Day    Packs/day: 1.00    Years: 40.00    Additional pack years: 0.00    Total pack years: 40.00    Types: Cigarettes   Smokeless tobacco: Never  Vaping Use   Vaping Use: Never used  Substance and Sexual Activity   Alcohol use: Yes    Alcohol/week: 40.0 standard drinks of alcohol    Types: 40 Standard drinks or equivalent per week    Comment: States I drink beer all day long. Patient states he has cur back to 3 drinks /day   Drug use: Yes    Types: Marijuana, Cocaine    Comment: Patient stated I smoke marijuana everyday.  Last use cocaine 03/21/2022   Sexual activity: Yes    Partners: Female  Other Topics Concern   Not on file  Social History Narrative   Right handed   Lives in a 8 story building    Drinks a pepsi occasionally    Social Determinants of Health   Financial Resource Strain: Low Risk  (10/18/2022)   Overall Financial Resource Strain (CARDIA)    Difficulty of Paying Living Expenses: Not hard at all  Food Insecurity: No Food Insecurity (10/18/2022)   Hunger Vital Sign    Worried About Running Out of Food in the Last Year: Never true    Ran Out of Food in the Last Year: Never true  Transportation Needs: No Transportation Needs (10/18/2022)    PRAPARE - Administrator, Civil Service (Medical): No    Lack of Transportation (Non-Medical): No  Physical Activity: Sufficiently Active (10/18/2022)   Exercise Vital Sign    Days of Exercise per Week: 7 days    Minutes of Exercise per Session: 30 min  Stress: No Stress Concern Present (10/18/2022)   [employer] of Occupational Health - Occupational Stress Questionnaire    Feeling of Stress : Not at all  Social Connections: Socially Integrated (10/18/2022)   Social Connection and Isolation Panel [NHANES]    Frequency of Communication with Friends and Family: More than three times a week    Frequency of Social Gatherings with Friends and Family:  More than three times a week    Attends Religious Services: More than 4 times per year    Active Member of Clubs or Organizations: Yes    Attends Engineer, structural: More than 4 times per year    Marital Status: Married    Tobacco Counseling Ready to quit: Yes Counseling given: Yes   Clinical Intake:  Pre-visit preparation completed: Yes  Pain : 0-10 Pain Score: 9  Pain Type: Chronic pain Pain Location: Rib cage Pain Orientation: Right Pain Descriptors / Indicators: Constant Pain Onset: More than a month ago     BMI - recorded: 19.94 Nutritional Status: BMI of 19-24  Normal Nutritional Risks: None Diabetes: No  How often do you need to have someone help you when you read instructions, pamphlets, or other written materials from your doctor or pharmacy?: 1 - Never  Diabetic? NO   Interpreter Needed?: No  Information entered by ::  Chavy Avera, CMA   Activities of Daily Living    10/18/2022    9:21 AM 03/30/2022    1:22 PM  In your present state of health, do you have any difficulty performing the following activities:  Hearing? 1   Comment wants a referral to an ENT   Vision? 0   Difficulty concentrating or making decisions? 0   Walking or climbing stairs? 1   Comment uses walker    Dressing or bathing? 0   Doing errands, shopping? 0 0  Preparing Food and eating ? N   Using the Toilet? N   In the past six months, have you accidently leaked urine? N   Do you have problems with loss of bowel control? N   Managing your Medications? N   Managing your Finances? N   Housekeeping or managing your Housekeeping? N     Patient Care Team: Ivonne Andrew, NP as PCP - General (Pulmonary Disease) Jens Som Madolyn Frieze, MD as PCP - Cardiology (Cardiology) Glendale Chard, DO as Consulting Physician (Neurology) Helane Gunther, DPM as Consulting Physician (Podiatry) Julio Sicks, MD as Consulting Physician (Neurosurgery) Glendale Chard, DO as Consulting Physician (Neurology)  Indicate any recent Medical Services you may have received from other than [facility] providers in the past year (date may be approximate).     Assessment:   This is a routine wellness examination for [place].  Hearing/Vision screen Hearing Screening - Comments:: Patient complains for hearing difficulties. Would like a referral to an ENT Vision Screening - Comments:: Wears rx glasses - up to date with routine eye exams with  Dr. Dione Booze  Dietary issues and exercise activities discussed: Current Exercise Habits: Home exercise routine, Type of exercise: walking, Time (Minutes): 30, Frequency (Times/Week): 7, Weekly Exercise (Minutes/Week): 210, Intensity: Mild, Exercise limited by: orthopedic condition(s)   Goals Addressed             This Visit's Progress    Patient Stated       His goal is to increase his strength and endurance so he doesn't have to use a walker anymore.        Depression Screen    10/18/2022    9:17 AM 08/16/2022    1:22 PM 04/18/2020    9:09 AM 01/15/2020    1:32 PM 04/10/2019    8:36 AM 01/08/2019    8:30 AM 01/08/2019    8:28 AM  PHQ 2/9 Scores  PHQ - 2 Score 0 4 0 0 0 0 0  PHQ- 9 Score  9  Exception Documentation   Medical reason Medical reason       Fall Risk     10/18/2022    9:15 AM 08/16/2022    1:15 PM 12/28/2021    3:50 PM 11/30/2020    8:32 AM 09/08/2020    7:46 AM  Fall Risk   Falls in the past year? 0 1 1 1 1   Number falls in past yr: 0 1 1 0 1  Injury with Fall? 0  1 1 0  Risk for fall due to : No Fall Risks      Follow up Falls prevention discussed  Falls evaluation completed      FALL RISK PREVENTION PERTAINING TO THE HOME:  Any stairs in or around the home? No  If so, are there any without handrails? No  Home free of loose throw rugs in walkways, pet beds, electrical cords, etc? Yes  Adequate lighting in your home to reduce risk of falls? Yes   ASSISTIVE DEVICES UTILIZED TO PREVENT FALLS:  Life alert? No  Use of a cane, walker or w/c? Yes  Grab bars in the bathroom? Yes  Shower chair or bench in shower? No  needs one, message sent to provider Elevated toilet seat or a handicapped toilet? No  needs one, message sent to referral  TIMED UP AND GO:  Was the test performed? No .  Cognitive Function:        10/18/2022    9:22 AM  6CIT Screen  What Year? 0 points  What month? 0 points  What time? 0 points  Count back from 20 0 points  Months in reverse 0 points  Repeat phrase 0 points  Total Score 0 points    Immunizations Immunization History  Administered Date(s) Administered   Influenza,inj,Quad PF,6+ Mos 03/08/2015, 03/08/2016, 03/06/2017, 03/07/2018, 04/10/2019   Moderna Sars-Covid-2 Vaccination 09/28/2019   Pneumococcal Conjugate-13 08/16/2022   Pneumococcal Polysaccharide-23 03/08/2015   Tdap 03/08/2016    TDAP status: Up to date  Flu Vaccine status: Up to date  Pneumococcal vaccine status: Up to date  Covid-19 vaccine status: Information provided on how to obtain vaccines.   Qualifies for Shingles Vaccine? Yes   Zostavax completed No   Shingrix Completed?: No.    Education has been provided regarding the importance of this vaccine. Patient has been advised to call insurance company to determine out of  pocket expense if they have not yet received this vaccine. Advised may also receive vaccine at local pharmacy or Health Dept. Verbalized acceptance and understanding.  Screening Tests Health Maintenance  Topic Date Due   Fecal DNA (Cologuard)  Never done   Lung Cancer Screening  Never done   Zoster Vaccines- Shingrix (1 of 2) Never done   COVID-19 Vaccine (2 - 2023-24 season) 01/19/2022   INFLUENZA VACCINE  12/20/2022   Pneumonia Vaccine 40+ Years old (3 of 3 - PPSV23 or PCV20) 08/16/2023   Medicare Annual Wellness (AWV)  10/18/2023   DTaP/Tdap/Td (2 - Td or Tdap) 03/08/2026   Hepatitis C Screening  Completed   HIV Screening  Completed   HPV VACCINES  Aged Out    Health Maintenance  Health Maintenance Due  Topic Date Due   Fecal DNA (Cologuard)  Never done   Lung Cancer Screening  Never done   Zoster Vaccines- Shingrix (1 of 2) Never done   COVID-19 Vaccine (2 - 2023-24 season) 01/19/2022    Colorectal cancer screening: Type of screening: Cologuard. Completed Patient has Cologuard at home  and will complete. Discussed how to do it. He verbalized understanding. Repeat every   years  Lung Cancer Screening: (Low Dose CT Chest recommended if Age 24-80 years, 30 pack-year currently smoking OR have quit w/in 15years.) does qualify.   Lung Cancer Screening Referral: 10/18/22  Additional Screening:  Hepatitis C Screening: does qualify; Completed 04/29/2019  Vision Screening: Recommended annual ophthalmology exams for early detection of glaucoma and other disorders of the eye. Is the patient up to date with their annual eye exam?  Yes  Who is the provider or what is the name of the office in which the patient attends annual eye exams? Dr. Dione Booze If pt is not established with a provider, would they like to be referred to a provider to establish care? No .   Dental Screening: Recommended annual dental exams for proper oral hygiene  Community Resource Referral / Chronic Care  Management: CRR required this visit?  No   CCM required this visit?  No      Plan:     I have personally reviewed and noted the following in the patient's chart:   Medical and social history Use of alcohol, tobacco or illicit drugs  Current medications and supplements including opioid prescriptions. Patient is currently taking opioid prescriptions. Information provided to patient regarding non-opioid alternatives. Patient advised to discuss non-opioid treatment plan with their provider. Functional ability and status Nutritional status Physical activity Advanced directives List of other physicians Hospitalizations, surgeries, and ER visits in previous 12 months Vitals Screenings to include cognitive, depression, and falls Referrals and appointments  In addition, I have reviewed and discussed with patient certain preventive protocols, quality metrics, and best practice recommendations. A written personalized care plan for preventive services as well as general preventive health recommendations were provided to patient.   Due to this being a telephonic visit, the after visit summary with patients personalized plan was offered to patient via mail or my-chart. Per request, patient was mailed a copy of their AVS.   Jordan Hawks Evangelyn Crouse, CMA   10/18/2022   Nurse Notes: Patient requesting a referral for a HH aid to come out and assist him. I advised him I will send a telephone note. Please call patient with details

## 2022-10-18 NOTE — Telephone Encounter (Signed)
 Patient completed his AWVI with me today. He would like a referral to an ENT due to hearing dfficulties. He is asking for a HH aid to come in a few times a week to assist him.   He needs prescriptions if he is able to get them for DME equipment: grab bars, a shower chair/bench, a toilet seat to make his toilet sit higher.   He stated during his visit he needs a refill on his Viagra.   Please reach out to patient and discuss.   Please mail him the second copy of his AVS that has the managing pain without opioids info on it.   Thank you so much!!   Reyna Lorenzi, CMA  [facility] AWV Team Direct Dial: [phone]

## 2022-10-18 NOTE — Patient Instructions (Addendum)
Leonard Ramos , Thank you for taking time to come for your Medicare Wellness Visit. I appreciate your ongoing commitment to your health goals. Please review the following plan we discussed and let me know if I can assist you in the future.   These are the goals we discussed:  Goals      Patient Stated     His goal is to increase his strength and endurance so he doesn't have to use a walker anymore.         This is a list of the screening recommended for you and due dates:  Health Maintenance  Topic Date Due   Cologuard (Stool DNA test)  Never done   Screening for Lung Cancer  Never done   Zoster (Shingles) Vaccine (1 of 2) Never done   COVID-19 Vaccine (2 - 2023-24 season) 01/19/2022   Flu Shot  12/20/2022   Pneumonia Vaccine (3 of 3 - PPSV23 or PCV20) 08/16/2023   Medicare Annual Wellness Visit  10/18/2023   DTaP/Tdap/Td vaccine (2 - Td or Tdap) 03/08/2026   Hepatitis C Screening  Completed   HIV Screening  Completed   HPV Vaccine  Aged Out    Advanced directives: Advance directive discussed with you today. Even though you declined this today, please call our office should you change your mind, and we can give you the proper paperwork for you to fill out. Advance care planning is a way to make decisions about medical care that fits your values in case you are ever unable to make these decisions for yourself.  Managing Pain Without Opioids  Opioids are strong medicines used to treat moderate to severe pain. For some people, especially those who have long-term (chronic) pain, opioids may not be the best choice for pain management due to: Side effects like nausea, constipation, and sleepiness. The risk of addiction (opioid use disorder). The longer you take opioids, the greater your risk of addiction. Pain that lasts for more than 3 months is called chronic pain. Managing chronic pain usually requires more than one approach and is often provided by a team of health care providers  working together (multidisciplinary approach). Pain management may be done at a pain management center or pain clinic. How to manage pain without the use of opioids Use non-opioid medicines Non-opioid medicines for pain may include: Over-the-counter or prescription non-steroidal anti-inflammatory drugs (NSAIDs). These may be the first medicines used for pain. They work well for muscle and bone pain, and they reduce swelling. Acetaminophen. This over-the-counter medicine may work well for milder pain but not swelling. Antidepressants. These may be used to treat chronic pain. A certain type of antidepressant (tricyclics) is often used. These medicines are given in lower doses for pain than when used for depression. Anticonvulsants. These are usually used to treat seizures but may also reduce nerve (neuropathic) pain. Muscle relaxants. These relieve pain caused by sudden muscle tightening (spasms). You may also use a pain medicine that is applied to the skin as a patch, cream, or gel (topical analgesic), such as a numbing medicine. These may cause fewer side effects than medicines taken by mouth. Do certain therapies as directed Some therapies can help with pain management. They include: Physical therapy. You will do exercises to gain strength and flexibility. A physical therapist may teach you exercises to move and stretch parts of your body that are weak, stiff, or painful. You can learn these exercises at physical therapy visits and practice them at home. Physical therapy  may also involve: Massage. Heat wraps or applying heat or cold to affected areas. Electrical signals that interrupt pain signals (transcutaneous electrical nerve stimulation, TENS). Weak lasers that reduce pain and swelling (low-level laser therapy). Signals from your body that help you learn to regulate pain (biofeedback). Occupational therapy. This helps you to learn ways to function at home and work with less  pain. Recreational therapy. This involves trying new activities or hobbies, such as a physical activity or drawing. Mental health therapy, including: Cognitive behavioral therapy (CBT). This helps you learn coping skills for dealing with pain. Acceptance and commitment therapy (ACT) to change the way you think and react to pain. Relaxation therapies, including muscle relaxation exercises and mindfulness-based stress reduction. Pain management counseling. This may be individual, family, or group counseling.  Receive medical treatments Medical treatments for pain management include: Nerve block injections. These may include a pain blocker and anti-inflammatory medicines. You may have injections: Near the spine to relieve chronic back or neck pain. Into joints to relieve back or joint pain. Into nerve areas that supply a painful area to relieve body pain. Into muscles (trigger point injections) to relieve some painful muscle conditions. A medical device placed near your spine to help block pain signals and relieve nerve pain or chronic back pain (spinal cord stimulation device). Acupuncture. Follow these instructions at home Medicines Take over-the-counter and prescription medicines only as told by your health care provider. If you are taking pain medicine, ask your health care providers about possible side effects to watch out for. Do not drive or use heavy machinery while taking prescription opioid pain medicine. Lifestyle  Do not use drugs or alcohol to reduce pain. If you drink alcohol, limit how much you have to: 0-1 drink a day for women who are not pregnant. 0-2 drinks a day for men. Know how much alcohol is in a drink. In the [place] one drink equals one 12 oz bottle of beer (355 mL), one 5 oz glass of wine (148 mL), or one 1 oz glass of hard liquor (44 mL). Do not use any products that contain nicotine or tobacco. These products include cigarettes, chewing tobacco, and vaping  devices, such as e-cigarettes. If you need help quitting, ask your health care provider. Eat a healthy diet and maintain a healthy weight. Poor diet and excess weight may make pain worse. Eat foods that are high in fiber. These include fresh fruits and vegetables, whole grains, and beans. Limit foods that are high in fat and processed sugars, such as fried and sweet foods. Exercise regularly. Exercise lowers stress and may help relieve pain. Ask your health care provider what activities and exercises are safe for you. If your health care provider approves, join an exercise class that combines movement and stress reduction. Examples include yoga and tai chi. Get enough sleep. Lack of sleep may make pain worse. Lower stress as much as possible. Practice stress reduction techniques as told by your therapist. General instructions Work with all your pain management providers to find the treatments that work best for you. You are an important member of your pain management team. There are many things you can do to reduce pain on your own. Consider joining an online or in-person support group for people who have chronic pain. Keep all follow-up visits. This is important. Where to find more information You can find more information about managing pain without opioids from: American Academy of Pain Medicine: painmed.org Institute for Chronic Pain: instituteforchronicpain.org American Chronic  Pain Association: theacpa.org Contact a health care provider if: You have side effects from pain medicine. Your pain gets worse or does not get better with treatments or home therapy. You are struggling with anxiety or depression. Summary Many types of pain can be managed without opioids. Chronic pain may respond better to pain management without opioids. Pain is best managed when you and a team of health care providers work together. Pain management without opioids may include non-opioid medicines, medical  treatments, physical therapy, mental health therapy, and lifestyle changes. Tell your health care providers if your pain gets worse or is not being managed well enough. This information is not intended to replace advice given to you by your health care provider. Make sure you discuss any questions you have with your health care provider. Document Revised: 08/17/2020 Document Reviewed: 08/17/2020 Elsevier Patient Education  2023 Elsevier Inc.  Conditions/risks identified: A referral has been placed for you to have a lung cancer screening. A referral has also been placed for you to have an aorta ultrasound due to your history of smoking. A telephone message will be sent to your provider requesting the additional referrals as discussed during your visit today.   Next appointment: Follow up in one year for your annual wellness visit. 10/24/2023 at 9:00am VIA TELEPHONE  Preventive Care 65 Years and Older, Male  Preventive care refers to lifestyle choices and visits with your health care provider that can promote health and wellness. What does preventive care include? A yearly physical exam. This is also called an annual well check. Dental exams once or twice a year. Routine eye exams. Ask your health care provider how often you should have your eyes checked. Personal lifestyle choices, including: Daily care of your teeth and gums. Regular physical activity. Eating a healthy diet. Avoiding tobacco and drug use. Limiting alcohol use. Practicing safe sex. Taking low doses of aspirin every day. Taking vitamin and mineral supplements as recommended by your health care provider. What happens during an annual well check? The services and screenings done by your health care provider during your annual well check will depend on your age, overall health, lifestyle risk factors, and family history of disease. Counseling  Your health care provider may ask you questions about your: Alcohol use. Tobacco  use. Drug use. Emotional well-being. Home and relationship well-being. Sexual activity. Eating habits. History of falls. Memory and ability to understand (cognition). Work and work Astronomer. Screening  You may have the following tests or measurements: Height, weight, and BMI. Blood pressure. Lipid and cholesterol levels. These may be checked every 5 years, or more frequently if you are over 45 years old. Skin check. Lung cancer screening. You may have this screening every year starting at age 75 if you have a 30-pack-year history of smoking and currently smoke or have quit within the past 15 years. Fecal occult blood test (FOBT) of the stool. You may have this test every year starting at age 43. Flexible sigmoidoscopy or colonoscopy. You may have a sigmoidoscopy every 5 years or a colonoscopy every 10 years starting at age 81. Prostate cancer screening. Recommendations will vary depending on your family history and other risks. Hepatitis C blood test. Hepatitis B blood test. Sexually transmitted disease (STD) testing. Diabetes screening. This is done by checking your blood sugar (glucose) after you have not eaten for a while (fasting). You may have this done every 1-3 years. Abdominal aortic aneurysm (AAA) screening. You may need this if you are a current  or former smoker. Osteoporosis. You may be screened starting at age 34 if you are at high risk. Talk with your health care provider about your test results, treatment options, and if necessary, the need for more tests. Vaccines  Your health care provider may recommend certain vaccines, such as: Influenza vaccine. This is recommended every year. Tetanus, diphtheria, and acellular pertussis (Tdap, Td) vaccine. You may need a Td booster every 10 years. Zoster vaccine. You may need this after age 84. Pneumococcal 13-valent conjugate (PCV13) vaccine. One dose is recommended after age 46. Pneumococcal polysaccharide (PPSV23) vaccine.  One dose is recommended after age 81. Talk to your health care provider about which screenings and vaccines you need and how often you need them. This information is not intended to replace advice given to you by your health care provider. Make sure you discuss any questions you have with your health care provider. Document Released: 06/03/2015 Document Revised: 01/25/2016 Document Reviewed: 03/08/2015 Elsevier Interactive Patient Education  2017 [employer].  Fall Prevention in the Home Falls can cause injuries. They can happen to people of all ages. There are many things you can do to make your home safe and to help prevent falls. What can I do on the outside of my home? Regularly fix the edges of walkways and driveways and fix any cracks. Remove anything that might make you trip as you walk through a door, such as a raised step or threshold. Trim any bushes or trees on the path to your home. Use bright outdoor lighting. Clear any walking paths of anything that might make someone trip, such as rocks or tools. Regularly check to see if handrails are loose or broken. Make sure that both sides of any steps have handrails. Any raised decks and porches should have guardrails on the edges. Have any leaves, snow, or ice cleared regularly. Use sand or salt on walking paths during winter. Clean up any spills in your garage right away. This includes oil or grease spills. What can I do in the bathroom? Use night lights. Install grab bars by the toilet and in the tub and shower. Do not use towel bars as grab bars. Use non-skid mats or decals in the tub or shower. If you need to sit down in the shower, use a plastic, non-slip stool. Keep the floor dry. Clean up any water that spills on the floor as soon as it happens. Remove soap buildup in the tub or shower regularly. Attach bath mats securely with double-sided non-slip rug tape. Do not have throw rugs and other things on the floor that can make  you trip. What can I do in the bedroom? Use night lights. Make sure that you have a light by your bed that is easy to reach. Do not use any sheets or blankets that are too big for your bed. They should not hang down onto the floor. Have a firm chair that has side arms. You can use this for support while you get dressed. Do not have throw rugs and other things on the floor that can make you trip. What can I do in the kitchen? Clean up any spills right away. Avoid walking on wet floors. Keep items that you use a lot in easy-to-reach places. If you need to reach something above you, use a strong step stool that has a grab bar. Keep electrical cords out of the way. Do not use floor polish or wax that makes floors slippery. If you must use wax,  use non-skid floor wax. Do not have throw rugs and other things on the floor that can make you trip. What can I do with my stairs? Do not leave any items on the stairs. Make sure that there are handrails on both sides of the stairs and use them. Fix handrails that are broken or loose. Make sure that handrails are as long as the stairways. Check any carpeting to make sure that it is firmly attached to the stairs. Fix any carpet that is loose or worn. Avoid having throw rugs at the top or bottom of the stairs. If you do have throw rugs, attach them to the floor with carpet tape. Make sure that you have a light switch at the top of the stairs and the bottom of the stairs. If you do not have them, ask someone to add them for you. What else can I do to help prevent falls? Wear shoes that: Do not have high heels. Have rubber bottoms. Are comfortable and fit you well. Are closed at the toe. Do not wear sandals. If you use a stepladder: Make sure that it is fully opened. Do not climb a closed stepladder. Make sure that both sides of the stepladder are locked into place. Ask someone to hold it for you, if possible. Clearly mark and make sure that you can  see: Any grab bars or handrails. First and last steps. Where the edge of each step is. Use tools that help you move around (mobility aids) if they are needed. These include: Canes. Walkers. Scooters. Crutches. Turn on the lights when you go into a dark area. Replace any light bulbs as soon as they burn out. Set up your furniture so you have a clear path. Avoid moving your furniture around. If any of your floors are uneven, fix them. If there are any pets around you, be aware of where they are. Review your medicines with your doctor. Some medicines can make you feel dizzy. This can increase your chance of falling. Ask your doctor what other things that you can do to help prevent falls. This information is not intended to replace advice given to you by your health care provider. Make sure you discuss any questions you have with your health care provider. Document Released: 03/03/2009 Document Revised: 10/13/2015 Document Reviewed: 06/11/2014 Elsevier Interactive Patient Education  2017 [employer].

## 2022-10-22 ENCOUNTER — Telehealth: Payer: Self-pay

## 2022-10-22 NOTE — Telephone Encounter (Signed)
Pt was called to advise of the shower chair approved and [employer] has been trying to call him about pick up or delivery. No answer lvm. KH

## 2022-10-25 ENCOUNTER — Other Ambulatory Visit: Payer: Self-pay

## 2022-11-13 ENCOUNTER — Ambulatory Visit: Payer: 59

## 2022-11-21 ENCOUNTER — Other Ambulatory Visit: Payer: Self-pay | Admitting: Nurse Practitioner

## 2022-11-21 ENCOUNTER — Telehealth: Payer: Self-pay

## 2022-11-21 MED ORDER — SILDENAFIL CITRATE 25 MG PO TABS
25.0000 mg | ORAL_TABLET | Freq: Every day | ORAL | 0 refills | Status: DC | PRN
  Filled 2022-11-21: qty 10, 30d supply, fill #0

## 2022-11-23 ENCOUNTER — Other Ambulatory Visit: Payer: Self-pay

## 2022-11-26 NOTE — Telephone Encounter (Signed)
Medication sent in by provider 11/21/22.

## 2022-11-27 ENCOUNTER — Ambulatory Visit
Admission: RE | Admit: 2022-11-27 | Discharge: 2022-11-27 | Disposition: A | Discharge status: Home / Self Care | Payer: 59 | Source: Ambulatory Visit | Attending: Nurse Practitioner | Admitting: Nurse Practitioner

## 2022-11-27 ENCOUNTER — Ambulatory Visit: Payer: 59

## 2022-11-27 DIAGNOSIS — Z87891 Personal history of nicotine dependence: Secondary | ICD-10-CM

## 2022-11-27 DIAGNOSIS — Z0000 Encounter for general adult medical examination without abnormal findings: Secondary | ICD-10-CM

## 2022-11-30 ENCOUNTER — Other Ambulatory Visit: Payer: Self-pay

## 2022-12-07 ENCOUNTER — Other Ambulatory Visit: Payer: Self-pay

## 2022-12-10 MED ORDER — SILDENAFIL CITRATE 25 MG PO TABS: 25.0000 mg | ORAL_TABLET | Freq: Every day | ORAL | 0 refills | Status: DC | PRN

## 2022-12-20 IMAGING — RF DG CERVICAL SPINE 1V
1 series · 1 of 1 positions shown · non-contrast
Comparison: May 22, 2006.

CLINICAL DATA: Anterior cervical fusion.

EXAM:
DG CERVICAL SPINE - 1 VIEW
Radiation exposure index: 0.5 mGy.

[Series 1: run · 1 of 1 slices shown]
[im 1/1]
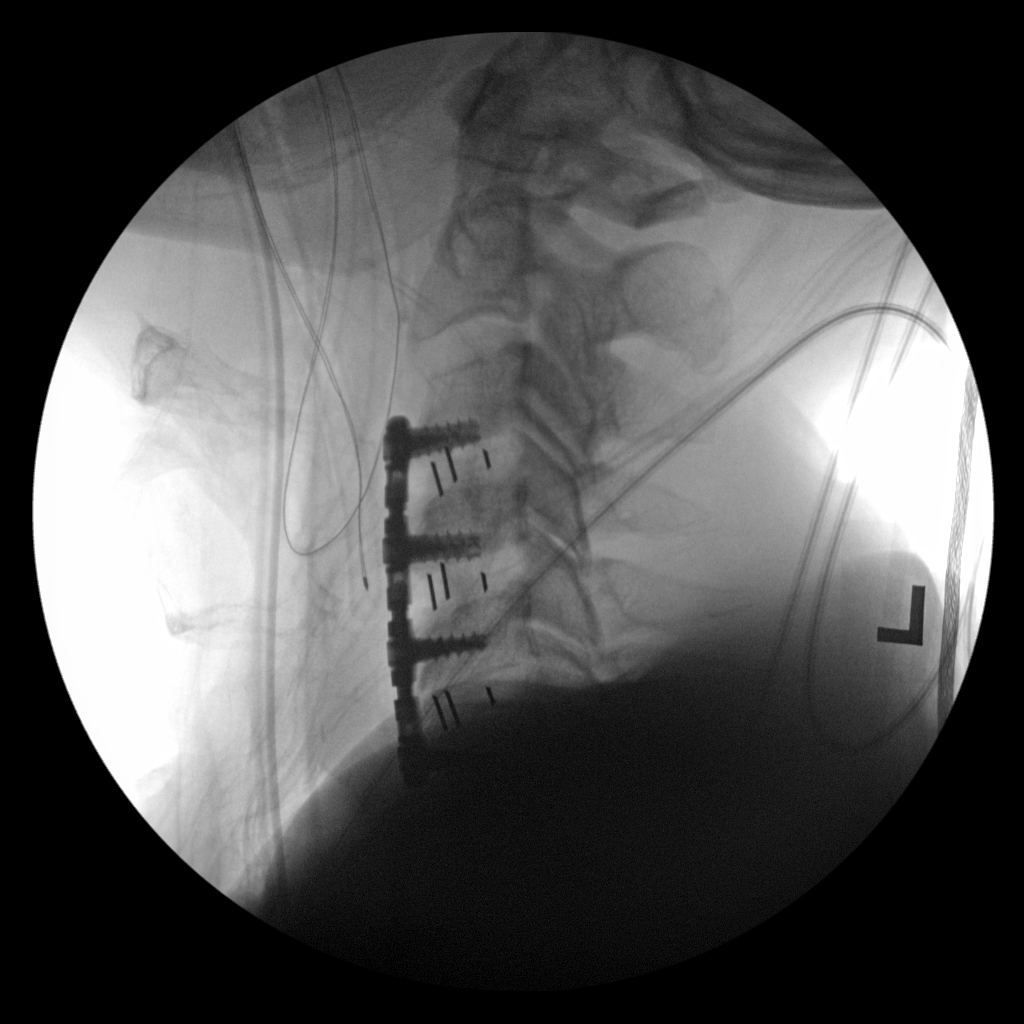

[1 of 1 positions shown; findings below may reference images not displayed]

FINDINGS: Single intraoperative lateral projection was obtained of the
cervical spine. The patient is status post surgical anterior fusion
of C3-4, C4-5 and C5-6.
IMPRESSION: Fluoroscopic guidance provided during anterior cervical fusion.

## 2022-12-28 ENCOUNTER — Ambulatory Visit (INDEPENDENT_AMBULATORY_CARE_PROVIDER_SITE_OTHER): Payer: 59

## 2022-12-28 ENCOUNTER — Ambulatory Visit (INDEPENDENT_AMBULATORY_CARE_PROVIDER_SITE_OTHER): Payer: 59 | Admitting: Podiatry

## 2022-12-28 ENCOUNTER — Other Ambulatory Visit: Payer: Self-pay | Admitting: Podiatry

## 2022-12-28 DIAGNOSIS — Z01818 Encounter for other preprocedural examination: Secondary | ICD-10-CM | POA: Diagnosis not present

## 2022-12-28 DIAGNOSIS — M2042 Other hammer toe(s) (acquired), left foot: Secondary | ICD-10-CM

## 2022-12-28 DIAGNOSIS — M19072 Primary osteoarthritis, left ankle and foot: Secondary | ICD-10-CM | POA: Diagnosis not present

## 2022-12-28 DIAGNOSIS — L84 Corns and callosities: Secondary | ICD-10-CM

## 2022-12-28 NOTE — H&P (View-Only) (Signed)
 Subjective:  Patient ID: Leonard Ramos, male    DOB: 09-15-1955,  MRN: [id number]  Chief Complaint  Patient presents with   Callouses    Left foot 2nd toe     67 y.o. male presents with the above complaint.  Patient presents with severe rigidity to the first metatarsophalangeal joint with underlying arthritis that is painful.  He also has second hammertoe deformity for which she would like to discuss treatment options.  He states it hurts with ambulation hurts with pressure he has not seen anyone as prior to seeing me.  He has failed conservative care with Dr. Stacie Acres is here for surgical consultation.  He has tried shoe gear modification padding protecting offloading none of which has given him any relief.  He would like to discuss surgical options pain scale is 7 out of 10.   Review of Systems: Negative except as noted in the HPI. Denies N/V/F/Ch.  Past Medical History:  Diagnosis Date   Alcohol use    Anxiety    Arthritis    GERD (gastroesophageal reflux disease)    Hyperlipidemia    Liver disease    Neuropathy    hands tingling and feet numbness   Periodontitis, chronic, generalized    Substance abuse (HCC)    Tobacco use    Vitamin D deficiency 03/2019    Current Outpatient Medications:    aspirin 81 MG chewable tablet, Chew 81 mg by mouth in the morning. (Patient not taking: Reported on 08/16/2022), Disp: , Rfl:    cyclobenzaprine (FLEXERIL) 10 MG tablet, Take 1 tablet (10 mg total) by mouth 3 (three) times daily as needed for muscle spasms. (Patient not taking: Reported on 08/16/2022), Disp: 30 tablet, Rfl: 0   docusate sodium (COLACE) 100 MG capsule, Take 1 capsule (100 mg total) by mouth 2 (two) times daily. (Patient not taking: Reported on 08/09/2022), Disp: 100 capsule, Rfl: 0   folic acid (FOLVITE) 1 MG tablet, Take 1 tablet (1 mg total) by mouth daily., Disp: 90 tablet, Rfl: 3   gabapentin (NEURONTIN) 600 MG tablet, Take 1 tablet (600 mg total) by mouth 3 (three) times  daily., Disp: 270 tablet, Rfl: 3   HYDROcodone-acetaminophen (NORCO/VICODIN) 5-325 MG tablet, Take 1-2 tablets by mouth every 4 (four) hours as needed for moderate pain or severe pain ((score 4 to 6)). (Patient not taking: Reported on 08/09/2022), Disp: 30 tablet, Rfl: 0   omeprazole (PRILOSEC) 20 MG capsule, Take 1 capsule (20 mg total) by mouth daily., Disp: 30 capsule, Rfl: 0   pravastatin (PRAVACHOL) 20 MG tablet, TAKE 1 TABLET (20 MG TOTAL) BY MOUTH DAILY., Disp: 90 tablet, Rfl: 3   sildenafil (VIAGRA) 25 MG tablet, Take 1 tablet (25 mg total) by mouth daily as needed for erectile dysfunction., Disp: 10 tablet, Rfl: 0   thiamine (VITAMIN B-1) 100 MG tablet, Take 1 tablet (100 mg total) by mouth daily., Disp: 30 tablet, Rfl: 6   traZODone (DESYREL) 50 MG tablet, TAKE 1/2 TO 1 TABLET BY MOUTH AT BEDTIME AS NEEDED FOR SLEEP (Patient not taking: Reported on 10/18/2022), Disp: 30 tablet, Rfl: 11  Social History   Tobacco Use  Smoking Status Every Day   Current packs/day: 1.00   Average packs/day: 1 pack/day for 40.0 years (40.0 ttl pk-yrs)   Types: Cigarettes  Smokeless Tobacco Never    No Known Allergies Objective:  There were no vitals filed for this visit. There is no height or weight on file to calculate BMI. Constitutional Well  developed. Well nourished.  Vascular Dorsalis pedis pulses palpable bilaterally. Posterior tibial pulses palpable bilaterally. Capillary refill normal to all digits.  No cyanosis or clubbing noted. Pedal hair growth normal.  Neurologic Normal speech. Oriented to person, place, and time. Epicritic sensation to light touch grossly present bilaterally.  Dermatologic Nails well groomed and normal in appearance. No open wounds. No skin lesions.  Orthopedic: Pain on palpation to the left first metatarsophalangeal joint limited range of motion noted of the first MTPJ deep intra-articular pain noted crepitus noted.  These findings consistent with arthritis of the  first metatarsophalangeal joint -Hammertoe contracture noted semiflexible in nature pain on palpation to the hammertoe of the second digit   Radiographs: 3 views of skeletally mature adult left foot: Moderate arthritis noted to the first metatarsophalangeal joint with uneven joint space narrowing.  Superior midfoot arthritis noted.  No other bony abnormalities identified hammertoe contracture of second digit noted. Assessment:   1. Arthritis of first metatarsophalangeal (MTP) joint of left foot   2. Hammer toe of second toe of left foot    Plan:  Patient was evaluated and treated and all questions answered.  Left second digit hammertoe contracture with underlying first metatarsophalangeal joint arthritis -All questions and concerns were discussed with the patient in extensive detail.  Given that he has failed all conservative care including shoe gear modification padding protective offloading injection I believe patient will benefit from surgical intervention.  In order to reduce the deformity patient will benefit from first metatarsophalangeal joint fusion to reduce the primary deforming force at the metatarsophalangeal joint against the second digit.  He will also benefit PIPJ arthroplasty of the second hammertoe left foot.  I discussed my preoperative intra postop plan with the patient extensive detail he states understanding of like to proceed with surgery -Informed surgical risk consent was reviewed and read aloud to the patient.  I reviewed the films.  I have discussed my findings with the patient in great detail.  I have discussed all risks including but not limited to infection, stiffness, scarring, limp, disability, deformity, damage to blood vessels and nerves, numbness, poor healing, need for braces, arthritis, chronic pain, amputation, death.  All benefits and realistic expectations discussed in great detail.  I have made no promises as to the outcome.  I have provided realistic  expectations.  I have offered the patient a 2nd opinion, which they have declined and assured me they preferred to proceed despite the risks   No follow-ups on file.   Left second hammetoe with capusltoimy   Left first MPJ fusion with arthrodesiss

## 2022-12-28 NOTE — Progress Notes (Signed)
Subjective:  Patient ID: Leonard Ramos, male    DOB: 09-15-1955,  MRN: [id number]  Chief Complaint  Patient presents with   Callouses    Left foot 2nd toe     67 y.o. male presents with the above complaint.  Patient presents with severe rigidity to the first metatarsophalangeal joint with underlying arthritis that is painful.  He also has second hammertoe deformity for which she would like to discuss treatment options.  He states it hurts with ambulation hurts with pressure he has not seen anyone as prior to seeing me.  He has failed conservative care with Dr. Stacie Acres is here for surgical consultation.  He has tried shoe gear modification padding protecting offloading none of which has given him any relief.  He would like to discuss surgical options pain scale is 7 out of 10.   Review of Systems: Negative except as noted in the HPI. Denies N/V/F/Ch.  Past Medical History:  Diagnosis Date   Alcohol use    Anxiety    Arthritis    GERD (gastroesophageal reflux disease)    Hyperlipidemia    Liver disease    Neuropathy    hands tingling and feet numbness   Periodontitis, chronic, generalized    Substance abuse (HCC)    Tobacco use    Vitamin D deficiency 03/2019    Current Outpatient Medications:    aspirin 81 MG chewable tablet, Chew 81 mg by mouth in the morning. (Patient not taking: Reported on 08/16/2022), Disp: , Rfl:    cyclobenzaprine (FLEXERIL) 10 MG tablet, Take 1 tablet (10 mg total) by mouth 3 (three) times daily as needed for muscle spasms. (Patient not taking: Reported on 08/16/2022), Disp: 30 tablet, Rfl: 0   docusate sodium (COLACE) 100 MG capsule, Take 1 capsule (100 mg total) by mouth 2 (two) times daily. (Patient not taking: Reported on 08/09/2022), Disp: 100 capsule, Rfl: 0   folic acid (FOLVITE) 1 MG tablet, Take 1 tablet (1 mg total) by mouth daily., Disp: 90 tablet, Rfl: 3   gabapentin (NEURONTIN) 600 MG tablet, Take 1 tablet (600 mg total) by mouth 3 (three) times  daily., Disp: 270 tablet, Rfl: 3   HYDROcodone-acetaminophen (NORCO/VICODIN) 5-325 MG tablet, Take 1-2 tablets by mouth every 4 (four) hours as needed for moderate pain or severe pain ((score 4 to 6)). (Patient not taking: Reported on 08/09/2022), Disp: 30 tablet, Rfl: 0   omeprazole (PRILOSEC) 20 MG capsule, Take 1 capsule (20 mg total) by mouth daily., Disp: 30 capsule, Rfl: 0   pravastatin (PRAVACHOL) 20 MG tablet, TAKE 1 TABLET (20 MG TOTAL) BY MOUTH DAILY., Disp: 90 tablet, Rfl: 3   sildenafil (VIAGRA) 25 MG tablet, Take 1 tablet (25 mg total) by mouth daily as needed for erectile dysfunction., Disp: 10 tablet, Rfl: 0   thiamine (VITAMIN B-1) 100 MG tablet, Take 1 tablet (100 mg total) by mouth daily., Disp: 30 tablet, Rfl: 6   traZODone (DESYREL) 50 MG tablet, TAKE 1/2 TO 1 TABLET BY MOUTH AT BEDTIME AS NEEDED FOR SLEEP (Patient not taking: Reported on 10/18/2022), Disp: 30 tablet, Rfl: 11  Social History   Tobacco Use  Smoking Status Every Day   Current packs/day: 1.00   Average packs/day: 1 pack/day for 40.0 years (40.0 ttl pk-yrs)   Types: Cigarettes  Smokeless Tobacco Never    No Known Allergies Objective:  There were no vitals filed for this visit. There is no height or weight on file to calculate BMI. Constitutional Well  developed. Well nourished.  Vascular Dorsalis pedis pulses palpable bilaterally. Posterior tibial pulses palpable bilaterally. Capillary refill normal to all digits.  No cyanosis or clubbing noted. Pedal hair growth normal.  Neurologic Normal speech. Oriented to person, place, and time. Epicritic sensation to light touch grossly present bilaterally.  Dermatologic Nails well groomed and normal in appearance. No open wounds. No skin lesions.  Orthopedic: Pain on palpation to the left first metatarsophalangeal joint limited range of motion noted of the first MTPJ deep intra-articular pain noted crepitus noted.  These findings consistent with arthritis of the  first metatarsophalangeal joint -Hammertoe contracture noted semiflexible in nature pain on palpation to the hammertoe of the second digit   Radiographs: 3 views of skeletally mature adult left foot: Moderate arthritis noted to the first metatarsophalangeal joint with uneven joint space narrowing.  Superior midfoot arthritis noted.  No other bony abnormalities identified hammertoe contracture of second digit noted. Assessment:   1. Arthritis of first metatarsophalangeal (MTP) joint of left foot   2. Hammer toe of second toe of left foot    Plan:  Patient was evaluated and treated and all questions answered.  Left second digit hammertoe contracture with underlying first metatarsophalangeal joint arthritis -All questions and concerns were discussed with the patient in extensive detail.  Given that he has failed all conservative care including shoe gear modification padding protective offloading injection I believe patient will benefit from surgical intervention.  In order to reduce the deformity patient will benefit from first metatarsophalangeal joint fusion to reduce the primary deforming force at the metatarsophalangeal joint against the second digit.  He will also benefit PIPJ arthroplasty of the second hammertoe left foot.  I discussed my preoperative intra postop plan with the patient extensive detail he states understanding of like to proceed with surgery -Informed surgical risk consent was reviewed and read aloud to the patient.  I reviewed the films.  I have discussed my findings with the patient in great detail.  I have discussed all risks including but not limited to infection, stiffness, scarring, limp, disability, deformity, damage to blood vessels and nerves, numbness, poor healing, need for braces, arthritis, chronic pain, amputation, death.  All benefits and realistic expectations discussed in great detail.  I have made no promises as to the outcome.  I have provided realistic  expectations.  I have offered the patient a 2nd opinion, which they have declined and assured me they preferred to proceed despite the risks   No follow-ups on file.   Left second hammetoe with capusltoimy   Left first MPJ fusion with arthrodesiss

## 2023-01-01 ENCOUNTER — Ambulatory Visit: Payer: 59 | Attending: Nurse Practitioner | Admitting: Audiologist

## 2023-01-09 ENCOUNTER — Telehealth: Payer: Self-pay | Admitting: Podiatry

## 2023-01-09 NOTE — Telephone Encounter (Signed)
Notification or Prior Authorization is not required for the requested services You are not required to submit a notification/prior authorization based on the information provided. If you have general questions about the prior authorization requirements, visit [email or web address] > Clinician Resources > Advance and Admission Notification Requirements. The number above acknowledges your notification. Please write this reference number down for future reference. If you would like to request an organization determination, please call us at [phone]. Decision ID #: [id number] The number above acknowledges your inquiry and our response. Please write this number down and refer to it for future inquiries. Coverage and payment for an item or service is governed by the member's benefit plan document, and, if applicable, the provider's participation agreement with the Health Plan. Patient details  Patient name BOHANNON COMPHER Member number [id number] Group number [id number] Product POS Relationship Employee Effective date 11/19/2022 Termination date 05/21/2023 Insurance type Medicare Verbal language preference English Written language preference English A future timeline may be available for this member. For future coverage please call the telephone number located on the back of the [facility] Medical ID card. Admitting/attending physician details  Name Nicholes Rough Tax ID number [id number] Address [address] [place], [place], [place] [address] Status In network Service details  Place of service [facility] What is place of service? Service details Surgical Facility details  Name [facility] Tax ID number [id number] Address [address] [place], [place] [address] [phone] Status In network Facility service dates details  Start date 01/14/2023 End date 04/14/2023 Service description Scheduled What is service  description? Diagnosis code details  Code pointer Primary Diagnosis code M19.072 Description Primary osteoarthritis, left ankle and foot Code pointer New Diagnosis code M20.42 Description Other hammer toe(s) (acquired), left foot Procedure code details  Code pointer Primary Procedure Code 60109 Description Capsulotomy; metatarsophalangeal joint, with or without tenorrhaphy, each joint (separate procedure) Selected servicing provider The provider who is providing the service being requested.  Servicing provider name [facility] PATEL Tax ID number [id number] Address [address] [place], [place], [place] [address]  T. [phone] Status In network Code pointer New Procedure Code [phone] Description Correction, hammertoe (eg, interphalangeal fusion, partial or total phalangectomy) Selected servicing provider The provider who is providing the service being requested.  Servicing provider name [facility] PATEL Tax ID number [id number] Address [address] [place], [place], [place] [address]  T. [phone] Status In network Code pointer New Procedure Code [id number] Description Arthrodesis, great toe; metatarsophalangeal joint Selected servicing provider The provider who is providing the service being requested.  Servicing provider name [facility] PATEL Tax ID number [id number] Address [address] [place], [place], [place] [address]  T. [phone] Status In network

## 2023-01-11 ENCOUNTER — Encounter
Admission: RE | Admit: 2023-01-11 | Discharge: 2023-01-11 | Disposition: A | Discharge status: Home / Self Care | Payer: 59 | Source: Ambulatory Visit | Attending: Podiatry | Admitting: Podiatry

## 2023-01-11 DIAGNOSIS — R9431 Abnormal electrocardiogram [ECG] [EKG]: Secondary | ICD-10-CM | POA: Insufficient documentation

## 2023-01-11 DIAGNOSIS — Z01812 Encounter for preprocedural laboratory examination: Secondary | ICD-10-CM

## 2023-01-11 DIAGNOSIS — I700 Atherosclerosis of aorta: Secondary | ICD-10-CM | POA: Diagnosis not present

## 2023-01-11 DIAGNOSIS — Z01818 Encounter for other preprocedural examination: Secondary | ICD-10-CM | POA: Insufficient documentation

## 2023-01-11 DIAGNOSIS — K760 Fatty (change of) liver, not elsewhere classified: Secondary | ICD-10-CM | POA: Diagnosis not present

## 2023-01-11 DIAGNOSIS — Z01810 Encounter for preprocedural cardiovascular examination: Secondary | ICD-10-CM

## 2023-01-11 DIAGNOSIS — K769 Liver disease, unspecified: Secondary | ICD-10-CM

## 2023-01-11 HISTORY — DX: Male erectile dysfunction, unspecified: N52.9

## 2023-01-11 HISTORY — DX: Spondylolysis, lumbosacral region: M43.07

## 2023-01-11 HISTORY — DX: Depression, unspecified: F32.A

## 2023-01-11 HISTORY — DX: Other chronic pain: M54.50

## 2023-01-11 HISTORY — DX: Atherosclerosis of aorta: I70.0

## 2023-01-11 HISTORY — DX: Other spondylosis with myelopathy, cervical region: M47.22

## 2023-01-11 HISTORY — DX: Disorder of teeth and supporting structures, unspecified: K08.9

## 2023-01-11 HISTORY — DX: Cannabis abuse, uncomplicated: F12.10

## 2023-01-11 HISTORY — DX: Presence of dental prosthetic device (complete) (partial): K08.109

## 2023-01-11 HISTORY — DX: Other hammer toe(s) (acquired), left foot: M20.42

## 2023-01-11 HISTORY — DX: Other spondylosis with myelopathy, cervical region: M47.12

## 2023-01-11 HISTORY — DX: Other chronic pain: G89.29

## 2023-01-11 HISTORY — DX: Cocaine abuse, uncomplicated: F14.10

## 2023-01-11 LAB — COMPREHENSIVE METABOLIC PANEL
ALT: 24 U/L (ref 0–44)
AST: 31 U/L (ref 15–41)
Albumin: 3.6 g/dL (ref 3.5–5.0)
Alkaline Phosphatase: 61 U/L (ref 38–126)
Anion gap: 9 (ref 5–15)
BUN: 8 mg/dL (ref 8–23)
CO2: 25 mmol/L (ref 22–32)
Calcium: 8.5 mg/dL — ABNORMAL LOW (ref 8.9–10.3)
Chloride: 107 mmol/L (ref 98–111)
Creatinine, Ser: 0.75 mg/dL (ref 0.61–1.24)
GFR, Estimated: >60 mL/min (ref >60)
Glucose, Bld: 83 mg/dL (ref 70–99)
Potassium: 4 mmol/L (ref 3.5–5.1)
Sodium: 141 mmol/L (ref 135–145)
Total Bilirubin: 0.6 mg/dL (ref 0.3–1.2)
Total Protein: 6.7 g/dL (ref 6.5–8.1)

## 2023-01-11 LAB — CBC
HCT: 44.9 % (ref 39.0–52.0)
Hemoglobin: 15.4 g/dL (ref 13.0–17.0)
MCH: 30.4 pg (ref 26.0–34.0)
MCHC: 34.3 g/dL (ref 30.0–36.0)
MCV: 88.6 fL (ref 80.0–100.0)
Platelets: 277 10*3/uL (ref 150–400)
RBC: 5.07 MIL/uL (ref 4.22–5.81)
RDW: 14.9 % (ref 11.5–15.5)
WBC: 4.2 10*3/uL (ref 4.0–10.5)
nRBC: 0 % (ref 0.0–0.2)

## 2023-01-11 NOTE — Pre-Procedure Instructions (Signed)
Pt states during anesthesia interview that he last did cocaine on 01-09-23. Pt instructed not to use cocaine anymore prior to his surgery on 01-14-23. Pt verbalized understanding

## 2023-01-11 NOTE — Patient Instructions (Addendum)
Your procedure is scheduled on:01-14-23 Monday Report to the Registration Desk on the 1st floor of the Medical Mall.Then proceed to the 2nd floor Surgery Desk To find out your arrival time, please call [phone] between 1PM - 3PM on:01-10-33 Friday If your arrival time is 6:00 am, do not arrive before that time as the Medical Mall entrance doors do not open until 6:00 am.  REMEMBER: Instructions that are not followed completely may result in serious medical risk, up to and including death; or upon the discretion of your surgeon and anesthesiologist your surgery may need to be rescheduled.  Do not eat food after midnight the night before surgery.  No gum chewing or hard candies.  You may however, drink CLEAR liquids up to 2 hours before you are scheduled to arrive for your surgery. Do not drink anything within 2 hours of your scheduled arrival time.  Clear liquids include: - water  - apple juice without pulp - gatorade (not RED colors) - black coffee or tea (Do NOT add milk or creamers to the coffee or tea) Do NOT drink anything that is not on this list.  One week prior to surgery: Stop Anti-inflammatories (NSAIDS) such as Advil, Aleve, Ibuprofen, Motrin, Naproxen, Naprosyn and Aspirin based products such as Excedrin, Goody's Powder, BC Powder.You may however, take Tylenol/Hydrocodone if needed for pain up until the day of surgery. Stop ANY OVER THE COUNTER supplements/vitamins NOW (01-11-23) until after surgery (Continue your folic acid up thru the day prior to surgery since this is a prescription)   Continue taking all prescribed medications  TAKE ONLY THESE MEDICATIONS THE MORNING OF SURGERY WITH A SIP OF WATER: -gabapentin (NEURONTIN)  -omeprazole (PRILOSEC)-take one the night before and one on the morning of surgery - helps to prevent nausea after surgery.)  No Alcohol for 24 hours before or after surgery.  No Smoking including e-cigarettes for 24 hours before surgery.  No  chewable tobacco products for at least 6 hours before surgery.  No nicotine patches on the day of surgery.  Do not use any "recreational" drugs for at least a week (preferably 2 weeks) before your surgery.  Please be advised that the combination of cocaine and anesthesia may have negative outcomes, up to and including death. If you test positive for cocaine, your surgery will be cancelled.  On the morning of surgery brush your teeth with toothpaste and water, you may rinse your mouth with mouthwash if you wish. Do not swallow any toothpaste or mouthwash.  Use CHG Soap as directed on instruction sheet.  Do not wear jewelry, make-up, hairpins, clips or nail polish.  Do not wear lotions, powders, or perfumes.   Do not shave body hair from the neck down 48 hours before surgery.  Contact lenses, hearing aids and dentures may not be worn into surgery.  Do not bring valuables to the hospital. [facility] is not responsible for any missing/lost belongings or valuables.   Notify your doctor if there is any change in your medical condition (cold, fever, infection).  Wear comfortable clothing (specific to your surgery type) to the hospital.  After surgery, you can help prevent lung complications by doing breathing exercises.  Take deep breaths and cough every 1-2 hours. Your doctor may order a device called an Incentive Spirometer to help you take deep breaths. When coughing or sneezing, hold a pillow firmly against your incision with both hands. This is called "splinting." Doing this helps protect your incision. It also decreases belly discomfort.  If you are being admitted to the hospital overnight, leave your suitcase in the car. After surgery it may be brought to your room.  In case of increased patient census, it may be necessary for you, the patient, to continue your postoperative care in the Same Day Surgery department.  If you are being discharged the day of surgery, you will not be  allowed to drive home. You will need a responsible individual to drive you home and stay with you for 24 hours after surgery.   If you are taking public transportation, you will need to have a responsible individual with you.  Please call the Pre-admissions Testing Dept. at [phone] if you have any questions about these instructions.  Surgery Visitation Policy:  Patients having surgery or a procedure may have two visitors.  Children under the age of 66 must have an adult with them who is not the patient.     Preparing for Surgery with CHLORHEXIDINE GLUCONATE (CHG) Soap  Chlorhexidine Gluconate (CHG) Soap  o An antiseptic cleaner that kills germs and bonds with the skin to continue killing germs even after washing  o Used for showering the night before surgery and morning of surgery  Before surgery, you can play an important role by reducing the number of germs on your skin.  CHG (Chlorhexidine gluconate) soap is an antiseptic cleanser which kills germs and bonds with the skin to continue killing germs even after washing.  Please do not use if you have an allergy to CHG or antibacterial soaps. If your skin becomes reddened/irritated stop using the CHG.  1. Shower the NIGHT BEFORE SURGERY and the MORNING OF SURGERY with CHG soap.  2. If you choose to wash your hair, wash your hair first as usual with your normal shampoo.  3. After shampooing, rinse your hair and body thoroughly to remove the shampoo.  4. Use CHG as you would any other liquid soap. You can apply CHG directly to the skin and wash gently with a scrungie or a clean washcloth.  5. Apply the CHG soap to your body only from the neck down. Do not use on open wounds or open sores. Avoid contact with your eyes, ears, mouth, and genitals (private parts). Wash face and genitals (private parts) with your normal soap.  6. Wash thoroughly, paying special attention to the area where your surgery will be performed.  7.  Thoroughly rinse your body with warm water.  8. Do not shower/wash with your normal soap after using and rinsing off the CHG soap.  9. Pat yourself dry with a clean towel.  10. Wear clean pajamas to bed the night before surgery.  12. Place clean sheets on your bed the night of your first shower and do not sleep with pets.  13. Shower again with the CHG soap on the day of surgery prior to arriving at the hospital.  14. Do not apply any deodorants/lotions/powders.  15. Please wear clean clothes to the hospital.

## 2023-01-13 MED ORDER — CHLORHEXIDINE GLUCONATE 0.12 % MT SOLN
15.0000 mL | Freq: Once | OROMUCOSAL | Status: AC
  Administered 2023-01-14: 15 mL via OROMUCOSAL

## 2023-01-13 MED ORDER — ORAL CARE MOUTH RINSE: 15.0000 mL | Freq: Once | OROMUCOSAL | Status: AC

## 2023-01-13 MED ORDER — LACTATED RINGERS IV SOLN: INTRAVENOUS | Status: DC

## 2023-01-13 MED ORDER — CEFAZOLIN SODIUM-DEXTROSE 2-4 GM/100ML-% IV SOLN
2.0000 g | Freq: Once | INTRAVENOUS | Status: AC
  Administered 2023-01-14: 2 g via INTRAVENOUS

## 2023-01-14 ENCOUNTER — Other Ambulatory Visit: Payer: Self-pay

## 2023-01-14 ENCOUNTER — Ambulatory Visit
Admission: RE | Admit: 2023-01-14 | Discharge: 2023-01-14 | Disposition: A | Discharge status: Home / Self Care | Payer: 59 | Attending: Podiatry | Admitting: Podiatry

## 2023-01-14 ENCOUNTER — Encounter: Payer: Self-pay | Admitting: Podiatry

## 2023-01-14 ENCOUNTER — Ambulatory Visit: Payer: 59 | Admitting: Anesthesiology

## 2023-01-14 ENCOUNTER — Ambulatory Visit: Payer: 59

## 2023-01-14 ENCOUNTER — Encounter
Admission: RE | Disposition: A | Discharge status: Home / Self Care | Payer: Self-pay | Source: Home / Self Care | Attending: Podiatry

## 2023-01-14 ENCOUNTER — Other Ambulatory Visit: Payer: Self-pay | Admitting: Podiatry

## 2023-01-14 DIAGNOSIS — M2042 Other hammer toe(s) (acquired), left foot: Secondary | ICD-10-CM | POA: Insufficient documentation

## 2023-01-14 DIAGNOSIS — F17210 Nicotine dependence, cigarettes, uncomplicated: Secondary | ICD-10-CM | POA: Insufficient documentation

## 2023-01-14 DIAGNOSIS — K219 Gastro-esophageal reflux disease without esophagitis: Secondary | ICD-10-CM | POA: Insufficient documentation

## 2023-01-14 DIAGNOSIS — M19072 Primary osteoarthritis, left ankle and foot: Secondary | ICD-10-CM | POA: Diagnosis not present

## 2023-01-14 HISTORY — PX: HALLUX VALGUS LAPIDUS: SHX6626

## 2023-01-14 HISTORY — PX: CAPSULOTOMY: SHX379

## 2023-01-14 HISTORY — PX: HAMMER TOE SURGERY: SHX385

## 2023-01-14 SURGERY — CAPSULOTOMY
Anesthesia: General | Site: Toe | Laterality: Left

## 2023-01-14 MED ORDER — FENTANYL CITRATE (PF) 100 MCG/2ML IJ SOLN
INTRAMUSCULAR | Status: DC | PRN
  Administered 2023-01-14: 25 ug via INTRAVENOUS
  Administered 2023-01-14: 50 ug via INTRAVENOUS
  Administered 2023-01-14: 25 ug via INTRAVENOUS

## 2023-01-14 MED ORDER — SODIUM CHLORIDE 0.9 % IR SOLN
Status: DC | PRN
  Administered 2023-01-14: 500 mL

## 2023-01-14 MED ORDER — CHLORHEXIDINE GLUCONATE 0.12 % MT SOLN
OROMUCOSAL | Status: AC
  Filled 2023-01-14: qty 15

## 2023-01-14 MED ORDER — LIDOCAINE HCL (PF) 2 % IJ SOLN
INTRAMUSCULAR | Status: AC
  Filled 2023-01-14: qty 5

## 2023-01-14 MED ORDER — BUPIVACAINE HCL (PF) 0.5 % IJ SOLN
INTRAMUSCULAR | Status: AC
  Filled 2023-01-14: qty 10

## 2023-01-14 MED ORDER — ALBUTEROL SULFATE HFA 108 (90 BASE) MCG/ACT IN AERS
INHALATION_SPRAY | RESPIRATORY_TRACT | Status: DC | PRN
  Administered 2023-01-14 (×2): 4 via RESPIRATORY_TRACT

## 2023-01-14 MED ORDER — ACETAMINOPHEN 10 MG/ML IV SOLN: 1000.0000 mg | Freq: Once | INTRAVENOUS | Status: DC | PRN

## 2023-01-14 MED ORDER — OXYCODONE HCL 5 MG PO TABS: 5.0000 mg | ORAL_TABLET | Freq: Once | ORAL | Status: DC | PRN

## 2023-01-14 MED ORDER — DEXAMETHASONE SODIUM PHOSPHATE 10 MG/ML IJ SOLN
INTRAMUSCULAR | Status: DC | PRN
  Administered 2023-01-14: 10 mg via INTRAVENOUS

## 2023-01-14 MED ORDER — CEFAZOLIN SODIUM-DEXTROSE 2-4 GM/100ML-% IV SOLN
INTRAVENOUS | Status: AC
  Filled 2023-01-14: qty 100

## 2023-01-14 MED ORDER — DEXMEDETOMIDINE HCL IN NACL 80 MCG/20ML IV SOLN
INTRAVENOUS | Status: DC | PRN
  Administered 2023-01-14: 12 ug via INTRAVENOUS
  Administered 2023-01-14: 8 ug via INTRAVENOUS

## 2023-01-14 MED ORDER — LIDOCAINE HCL (CARDIAC) PF 100 MG/5ML IV SOSY
PREFILLED_SYRINGE | INTRAVENOUS | Status: DC | PRN
  Administered 2023-01-14: 60 mg via INTRAVENOUS

## 2023-01-14 MED ORDER — MIDAZOLAM HCL 2 MG/2ML IJ SOLN
INTRAMUSCULAR | Status: AC
  Filled 2023-01-14: qty 2

## 2023-01-14 MED ORDER — ACETAMINOPHEN 10 MG/ML IV SOLN
INTRAVENOUS | Status: DC | PRN
  Administered 2023-01-14: 1000 mg via INTRAVENOUS

## 2023-01-14 MED ORDER — ONDANSETRON HCL 4 MG/2ML IJ SOLN: 4.0000 mg | Freq: Once | INTRAMUSCULAR | Status: DC | PRN

## 2023-01-14 MED ORDER — DEXAMETHASONE SODIUM PHOSPHATE 10 MG/ML IJ SOLN
INTRAMUSCULAR | Status: AC
  Filled 2023-01-14: qty 1

## 2023-01-14 MED ORDER — ACETAMINOPHEN 10 MG/ML IV SOLN
INTRAVENOUS | Status: AC
  Filled 2023-01-14: qty 100

## 2023-01-14 MED ORDER — OXYCODONE HCL 5 MG/5ML PO SOLN: 5.0000 mg | Freq: Once | ORAL | Status: DC | PRN

## 2023-01-14 MED ORDER — BUPIVACAINE LIPOSOME 1.3 % IJ SUSP
INTRAMUSCULAR | Status: AC
  Filled 2023-01-14: qty 10

## 2023-01-14 MED ORDER — ALBUTEROL SULFATE HFA 108 (90 BASE) MCG/ACT IN AERS
INHALATION_SPRAY | RESPIRATORY_TRACT | Status: AC
  Filled 2023-01-14: qty 6.7

## 2023-01-14 MED ORDER — ONDANSETRON HCL 4 MG/2ML IJ SOLN
INTRAMUSCULAR | Status: AC
  Filled 2023-01-14: qty 2

## 2023-01-14 MED ORDER — MIDAZOLAM HCL 2 MG/2ML IJ SOLN
2.0000 mg | Freq: Once | INTRAMUSCULAR | Status: AC
  Administered 2023-01-14: 2 mg via INTRAVENOUS

## 2023-01-14 MED ORDER — IBUPROFEN 800 MG PO TABS: 800.0000 mg | ORAL_TABLET | Freq: Four times a day (QID) | ORAL | 1 refills | Status: DC | PRN

## 2023-01-14 MED ORDER — PROPOFOL 1000 MG/100ML IV EMUL
INTRAVENOUS | Status: AC
  Filled 2023-01-14: qty 100

## 2023-01-14 MED ORDER — OXYCODONE-ACETAMINOPHEN 5-325 MG PO TABS
1.0000 | ORAL_TABLET | ORAL | 0 refills | Status: DC | PRN
Start: 2023-01-14 — End: 2024-02-19

## 2023-01-14 MED ORDER — BUPIVACAINE HCL (PF) 0.5 % IJ SOLN
INTRAMUSCULAR | Status: DC | PRN
  Administered 2023-01-14: 10 mL

## 2023-01-14 MED ORDER — BUPIVACAINE LIPOSOME 1.3 % IJ SUSP
INTRAMUSCULAR | Status: DC | PRN
  Administered 2023-01-14: 10 mL

## 2023-01-14 MED ORDER — IPRATROPIUM-ALBUTEROL 0.5-2.5 (3) MG/3ML IN SOLN
RESPIRATORY_TRACT | Status: AC
  Filled 2023-01-14: qty 3

## 2023-01-14 MED ORDER — FENTANYL CITRATE (PF) 100 MCG/2ML IJ SOLN: 25.0000 ug | INTRAMUSCULAR | Status: DC | PRN

## 2023-01-14 MED ORDER — IPRATROPIUM-ALBUTEROL 0.5-2.5 (3) MG/3ML IN SOLN
3.0000 mL | Freq: Once | RESPIRATORY_TRACT | Status: AC
  Administered 2023-01-14: 3 mL via RESPIRATORY_TRACT

## 2023-01-14 MED ORDER — FENTANYL CITRATE (PF) 100 MCG/2ML IJ SOLN
INTRAMUSCULAR | Status: AC
  Filled 2023-01-14: qty 2

## 2023-01-14 MED ORDER — PROPOFOL 10 MG/ML IV BOLUS
INTRAVENOUS | Status: DC | PRN
  Administered 2023-01-14: 25 ug/kg/min via INTRAVENOUS
  Administered 2023-01-14 (×2): 50 mg via INTRAVENOUS
  Administered 2023-01-14: 200 mg via INTRAVENOUS

## 2023-01-14 SURGICAL SUPPLY — 72 items
1.1 Guidewire Double IMPLANT
1.6 k-wire IMPLANT
1.6 x 10mm plate tack IMPLANT
APL PRP STRL LF DISP 70% ISPRP (MISCELLANEOUS) ×2
BIT DRILL AT3 STD AC 3 (DRILL) IMPLANT
BIT DRILL LEOS 2.4 (BIT) IMPLANT
BLADE AVERAGE 25X9 (BLADE) ×2 IMPLANT
BLADE MED AGGRESSIVE (BLADE) IMPLANT
BLADE OSC/SAG .038X5.5 CUT EDG (BLADE) ×2 IMPLANT
BLADE SAW SAG 25X90X1.19 (BLADE) IMPLANT
BLADE SURG 15 STRL LF DISP TIS (BLADE) ×4 IMPLANT
BLADE SURG 15 STRL SS (BLADE) ×4
BNDG CMPR 5X3 KNIT ELC UNQ LF (GAUZE/BANDAGES/DRESSINGS) ×2
BNDG CMPR 5X4 KNIT ELC UNQ LF (GAUZE/BANDAGES/DRESSINGS) ×2
BNDG CMPR 75X21 PLY HI ABS (MISCELLANEOUS) ×2
BNDG ELASTIC 3INX 5YD STR LF (GAUZE/BANDAGES/DRESSINGS) ×2 IMPLANT
BNDG ELASTIC 4INX 5YD STR LF (GAUZE/BANDAGES/DRESSINGS) ×2 IMPLANT
BNDG ESMARCH 4 X 12 STRL LF (GAUZE/BANDAGES/DRESSINGS)
BNDG ESMARCH 4X12 STRL LF (GAUZE/BANDAGES/DRESSINGS) IMPLANT
BNDG GAUZE DERMACEA FLUFF 4 (GAUZE/BANDAGES/DRESSINGS) ×2 IMPLANT
BNDG GZE DERMACEA 4 6PLY (GAUZE/BANDAGES/DRESSINGS) ×2
CHLORAPREP W/TINT 26 (MISCELLANEOUS) ×2 IMPLANT
COVER PIN YLW 0.028-062 (MISCELLANEOUS) IMPLANT
CUFF TOURN SGL QUICK 18X4 (TOURNIQUET CUFF) IMPLANT
DRAPE EXTREMITY 106X87X128.5 (DRAPES) ×2 IMPLANT
DRAPE IMP U-DRAPE 54X76 (DRAPES) ×2 IMPLANT
DRAPE OEC MINIVIEW 54X84 (DRAPES) ×2 IMPLANT
DRAPE U-SHAPE 47X51 STRL (DRAPES) ×2 IMPLANT
DRILL AT3 STD AC 3 (DRILL) ×2
ELECT REM PT RETURN 9FT ADLT (ELECTROSURGICAL) ×2
ELECTRODE REM PT RTRN 9FT ADLT (ELECTROSURGICAL) ×2 IMPLANT
GAUZE 4X4 16PLY ~~LOC~~+RFID DBL (SPONGE) ×2 IMPLANT
GAUZE SPONGE 4X4 12PLY STRL (GAUZE/BANDAGES/DRESSINGS) ×2 IMPLANT
GAUZE STRETCH 2X75IN STRL (MISCELLANEOUS) ×2 IMPLANT
GLOVE BIO SURGEON STRL SZ7.5 (GLOVE) ×2 IMPLANT
GLOVE SURG SYN 7.0 (GLOVE) ×2 IMPLANT
GLOVE SURG SYN 7.0 PF PI (GLOVE) ×2 IMPLANT
GOWN STRL REUS W/ TWL LRG LVL3 (GOWN DISPOSABLE) ×2 IMPLANT
GOWN STRL REUS W/TWL LRG LVL3 (GOWN DISPOSABLE) ×2
KIT TURNOVER CYSTO (KITS) ×2 IMPLANT
KWIRE 0.062 THREADED (WIRE) IMPLANT
KWIRE LEOS SMOOTH 1.6 (WIRE) IMPLANT
NDL HYPO 25X1 1.5 SAFETY (NEEDLE) IMPLANT
NEEDLE HYPO 25X1 1.5 SAFETY (NEEDLE) IMPLANT
NS IRRIG 1000ML POUR BTL (IV SOLUTION) IMPLANT
NS IRRIG 500ML POUR BTL (IV SOLUTION) IMPLANT
PACK EXTREMITY ARMC (MISCELLANEOUS) ×2 IMPLANT
PAD CAST 4YDX4 CTTN HI CHSV (CAST SUPPLIES) ×2 IMPLANT
PAD PREP OB/GYN DISP 24X41 (PERSONAL CARE ITEMS) ×2 IMPLANT
PADDING CAST COTTON 4X4 STRL (CAST SUPPLIES) ×2
PENCIL SMOKE EVACUATOR (MISCELLANEOUS) ×2 IMPLANT
PLATE METAPH LEOS SM 0D LT (Screw) IMPLANT
PLATE TACK LEOS 1.6X10 (Plate) IMPLANT
SCREW LOCK VA LEOS 3.5X12 (Screw) IMPLANT
SCREW LOCK VA LEOS 3.5X18 (Screw) IMPLANT
SCREW NLOCK LEOS 3.5X18 (Screw) IMPLANT
SCREW STD AT3 X 30 (Screw) IMPLANT
STOCKINETTE STRL 6IN 960660 (GAUZE/BANDAGES/DRESSINGS) ×2 IMPLANT
SUCTION TUBE FRAZIER 10FR DISP (SUCTIONS) ×2 IMPLANT
SUT ETHILON 3 0 PS 1 (SUTURE) ×4 IMPLANT
SUT MNCRL 4-0 (SUTURE) ×2
SUT MNCRL 4-0 27XMFL (SUTURE) ×2
SUT MNCRL AB 3-0 PS2 27 (SUTURE) ×2 IMPLANT
SUT PROLENE 3 0 FS 2 (SUTURE) IMPLANT
SUT VIC AB 3-0 SH 27 (SUTURE)
SUT VIC AB 3-0 SH 27X BRD (SUTURE) IMPLANT
SUTURE MNCRL 4-0 27XMF (SUTURE) IMPLANT
SYR BULB EAR ULCER 3OZ GRN STR (SYRINGE) ×2 IMPLANT
TOWEL OR 17X26 4PK STRL BLUE (TOWEL DISPOSABLE) ×2 IMPLANT
TUBING CONNECTOR 18X5MM (MISCELLANEOUS) ×2 IMPLANT
WIRE GUIDE SINGLE TROC 1.1X150 (WIRE) IMPLANT
WIRE GUIDE TROCAR 1.1X150 (WIRE) IMPLANT

## 2023-01-14 NOTE — Anesthesia Procedure Notes (Signed)
Procedure Name: Intubation Date/Time: 01/14/2023 1:39 PM  Performed by: Elisabeth Pigeon, CRNAPre-anesthesia Checklist: Patient identified, Emergency Drugs available, Suction available and Patient being monitored Patient Re-evaluated:Patient Re-evaluated prior to induction Oxygen Delivery Method: Circle system utilized Preoxygenation: Pre-oxygenation with 100% oxygen Induction Type: IV induction Ventilation: Mask ventilation without difficulty LMA: LMA inserted LMA Size: 5.0 Tube type: Oral Number of attempts: 1 Placement Confirmation: ETT inserted through vocal cords under direct vision, positive ETCO2 and breath sounds checked- equal and bilateral Tube secured with: Tape Dental Injury: Teeth and Oropharynx as per pre-operative assessment

## 2023-01-14 NOTE — Discharge Instructions (Signed)
AMBULATORY SURGERY  DISCHARGE INSTRUCTIONS   The drugs that you were given will stay in your system until tomorrow so for the next 24 hours you should not:  Drive an automobile Make any legal decisions Drink any alcoholic beverage   You may resume regular meals tomorrow.  Today it is better to start with liquids and gradually work up to solid foods.  You may eat anything you prefer, but it is better to start with liquids, then soup and crackers, and gradually work up to solid foods.   Please notify your doctor immediately if you have any unusual bleeding, trouble breathing, redness and pain at the surgery site, drainage, fever, or pain not relieved by medication.   Additional Instructions:  leave green armband on for 4 days

## 2023-01-14 NOTE — Anesthesia Procedure Notes (Signed)
Anesthesia Regional Block: Popliteal block   Pre-Anesthetic Checklist: , timeout performed,  Correct Patient, Correct Site, Correct Laterality,  Correct Procedure, Correct Position, site marked,  Risks and benefits discussed,  Surgical consent,  Pre-op evaluation,  At surgeon's request and post-op pain management  Laterality: Lower and Left  Prep: chloraprep       Needles:  Injection technique: Single-shot  Needle Type: Echogenic Needle     Needle Length: 9cm  Needle Gauge: 21     Additional Needles:   Procedures:,,,, ultrasound used (permanent image in chart),,    Narrative:  Injection made incrementally with aspirations every 5 mL.  Performed by: Personally  Anesthesiologist: Corinda Gubler, MD  Additional Notes: Patient's chart reviewed and they were deemed appropriate candidate for procedure, at surgeon's request. Patient educated about risks, benefits, and alternatives of the block including but not limited to: temporary or permanent nerve damage, bleeding, infection, damage to surround tissues, block failure, local anesthetic toxicity. Patient expressed understanding. A formal time-out was conducted consistent with institution rules.  Monitors were applied, and minimal sedation used. The site was prepped with skin prep and allowed to dry, and sterile gloves were used. A high frequency linear ultrasound probe with probe cover was utilized throughout. Popliteal artery pulsatile and visualized in popliteal fossa along with adjacent sciatic nerve and its branch point, which appeared anatomically normal, local anesthetic injected around them just proximal to the branch point, and echogenic block needle trajectory was monitored throughout. Aspiration performed every 5ml. Blood vessels were avoided. All injections were performed without resistance and free of blood and paresthesias. The patient tolerated the procedure well.  Injectate: 10ml exparel + 10ml 0.5% bupivacaine

## 2023-01-14 NOTE — Anesthesia Preprocedure Evaluation (Signed)
Anesthesia Evaluation  Patient identified by MRN, date of birth, ID band Patient awake    Reviewed: Allergy & Precautions, NPO status , Patient's Chart, lab work & pertinent test results  History of Anesthesia Complications Negative for: history of anesthetic complications  Airway Mallampati: II  TM Distance: >3 FB Neck ROM: Full    Dental  (+) Edentulous Upper, Edentulous Lower   Pulmonary neg sleep apnea, neg COPD, Current SmokerPatient did not abstain from smoking.   Pulmonary exam normal breath sounds clear to auscultation       Cardiovascular Exercise Tolerance: Good METS(-) hypertension(-) CAD and (-) Past MI negative cardio ROS (-) dysrhythmias  Rhythm:Regular Rate:Normal - Systolic murmurs    Neuro/Psych  PSYCHIATRIC DISORDERS Anxiety Depression    negative neurological ROS     GI/Hepatic ,GERD  Controlled and Medicated,,(+)     substance abuse  cocaine use and marijuana useLast used cocaine 3 days ago. None since then. Does not appear acutely intoxicated.   Endo/Other  neg diabetes    Renal/GU negative Renal ROS     Musculoskeletal  (+) Arthritis ,    Abdominal   Peds  Hematology   Anesthesia Other Findings Past Medical History: No date: Alcohol use     Comment:  drinks 2 40's per day No date: Anxiety No date: Aortic atherosclerosis (HCC) No date: Arthritis No date: Cervical spondylosis with myelopathy and radiculopathy No date: Chronic low back pain No date: Cocaine abuse (HCC)     Comment:  last used 01-09-23 No date: Depression No date: ED (erectile dysfunction) No date: Full dentures No date: GERD (gastroesophageal reflux disease) No date: Hammertoe of left foot No date: Hyperlipidemia No date: Liver disease No date: Lumbosacral spondylolysis No date: Marijuana abuse     Comment:  uses marijuana everyday No date: Neuropathy     Comment:  hands tingling and feet numbness No date:  Periodontitis, chronic, generalized No date: Poor dentition No date: Substance abuse (HCC) No date: Tobacco use 03/2019: Vitamin D deficiency  Reproductive/Obstetrics                             Anesthesia Physical Anesthesia Plan  ASA: 2  Anesthesia Plan: General   Post-op Pain Management: Ofirmev IV (intra-op)* and Regional block*   Induction: Intravenous  PONV Risk Score and Plan: 1 and Ondansetron, Dexamethasone and Midazolam  Airway Management Planned: Oral ETT and Video Laryngoscope Planned  Additional Equipment: None  Intra-op Plan:   Post-operative Plan: Extubation in OR  Informed Consent: I have reviewed the patients History and Physical, chart, labs and discussed the procedure including the risks, benefits and alternatives for the proposed anesthesia with the patient or authorized representative who has indicated his/her understanding and acceptance.     Dental advisory given  Plan Discussed with: CRNA and Surgeon  Anesthesia Plan Comments: (Discussed risks of anesthesia with patient, including PONV, sore throat, lip/dental/eye damage. Rare risks discussed as well, such as cardiorespiratory and neurological sequelae, and allergic reactions. Discussed the role of CRNA in patient's perioperative care. Patient understands. Patient counseled on benefits of smoking and drug use cessation, and increased perioperative risks associated with continued smoking, as well as with any undisclosed recent drug use.  Discussed r/b/a of popliteal block, including:  - bleeding, infection, nerve damage - poor or non functioning block. - reactions and toxicity to local anesthetic Patient understands.  )       Anesthesia Quick Evaluation

## 2023-01-14 NOTE — Transfer of Care (Signed)
Immediate Anesthesia Transfer of Care Note  Patient: Leonard Ramos  Procedure(s) Performed: CAPSULOTOMY MPJ RELEASE (Left) HAMMER TOE CORRECTION (Left: Toe) HALLUX MPJ FUSION (Left)  Patient Location: PACU  Anesthesia Type:General and Regional  Level of Consciousness: sedated  Airway & Oxygen Therapy: Patient Spontanous Breathing and Patient connected to face mask oxygen  Post-op Assessment: Report given to RN and Post -op Vital signs reviewed and stable  Post vital signs: Reviewed and stable  Last Vitals:  Vitals Value Taken Time  BP 98/69 01/14/23 1452  Temp    Pulse 90 01/14/23 1456  Resp 18 01/14/23 1456  SpO2 95 % 01/14/23 1456  Vitals shown include unfiled device data.  Last Pain:  Vitals:   01/14/23 1318  TempSrc:   PainSc: Asleep         Complications: No notable events documented.

## 2023-01-14 NOTE — Interval H&P Note (Signed)
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  The various methods of treatment have been discussed with the patient and family. After consideration of risks, benefits and other options for treatment, the patient has consented to  Procedure(s): CAPSULOTOMY MPJ RELEASE (Left) HAMMER TOE CORRECTION (Left) HALLUX MPJ FUSION (Left) as a surgical intervention.  The patient's history has been reviewed, patient examined, no change in status, stable for surgery.  I have reviewed the patient's chart and labs.  Questions were answered to the patient's satisfaction.    Patient's  PCP cleared the patient for surgery and states that no changes since the last office visit.  See anesthesia note for lung and heart assessment   Candelaria Stagers

## 2023-01-14 NOTE — Anesthesia Postprocedure Evaluation (Signed)
Anesthesia Post Note  Patient: Leonard Ramos  Procedure(s) Performed: CAPSULOTOMY MPJ RELEASE (Left) HAMMER TOE CORRECTION (Left: Toe) HALLUX MPJ FUSION (Left)  Patient location during evaluation: PACU Anesthesia Type: General Level of consciousness: awake and alert Pain management: pain level controlled Vital Signs Assessment: post-procedure vital signs reviewed and stable Respiratory status: spontaneous breathing, nonlabored ventilation, respiratory function stable and patient connected to nasal cannula oxygen Cardiovascular status: blood pressure returned to baseline and stable Postop Assessment: no apparent nausea or vomiting Anesthetic complications: no   No notable events documented.   Last Vitals:  Vitals:   01/14/23 1500 01/14/23 1515  BP: (!) (P) 149/90 136/86  Pulse: 88 85  Resp: 19   Temp: (!) (P) 36.2 C   SpO2: 95% 99%    Last Pain:  Vitals:   01/14/23 1318  TempSrc:   PainSc: Asleep                 Corinda Gubler

## 2023-01-15 ENCOUNTER — Encounter: Payer: Self-pay | Admitting: Podiatry

## 2023-01-16 ENCOUNTER — Other Ambulatory Visit: Payer: Self-pay | Admitting: Podiatry

## 2023-01-16 ENCOUNTER — Telehealth: Payer: Self-pay

## 2023-01-16 DIAGNOSIS — R2689 Other abnormalities of gait and mobility: Secondary | ICD-10-CM

## 2023-01-16 NOTE — Telephone Encounter (Signed)
Patient called and left a message on 8/27 @11 :07 - he stated that he needs some crutches to get around after his foot surgery. Thanks

## 2023-01-17 NOTE — Op Note (Signed)
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 male presents with left severe first metatarsophalangeal joint arthritis with second digit hammertoe pain. Patient has failed all conservative therapy including but not limited to shoe gear modification padding protecting injection. He wishes to have surgical correction of the foot/deformity. It was determined that patient would benefit from left first metatarsophalangeal joint fusion with second digit hammertoe arthroplasty with second metatarsophalangeal joint capsulotomy. Informed surgical risk consent was reviewed and read aloud to the patient.  I reviewed the films.  I have discussed my findings with the patient in great detail.  I have discussed all risks including but not limited to infection, stiffness, scarring, limp, disability, deformity, damage to blood vessels and nerves, numbness, poor healing, need for braces, arthritis, chronic pain, amputation, death.  All benefits and realistic expectations discussed in great detail.  I have made no promises as to the outcome.  I have provided realistic expectations.  I have offered the patient a 2nd opinion, which they have declined and assured me they preferred to proceed despite the risks   Procedure in detail: The patient was  both verbally and visually identified by myself, the nursing staff, and anesthesia staff in the preoperative holding area. They were then transferred to the operating room and placed on the operative table in supine position.  Attention was directed to the dorsal aspect of the left first metatarsophalangeal joint where a linear skin incision approximately 6cm long was made in the skin using a #15 blade. This incision was carried down through the subcutaneous tissue taking care to clamp and cauterize all neurovascular structures as necessary.  The capsule of 1st MPJ was identified. A linear capsulotomy was then performed in-line with the original skin incision and the capsule was reflected both medially and laterally to expose the head of the first metatarsal and the base of the proximal phalanx. The articular surface of the 1st metatarsal head was noted to show evidence of []  degeneration under direct visualization. The sagittal saw was then used to resect the dorsal, medial, and lateral prominences off of the 1st metatarsal. A rongeur was utilized to remove the dorsal exostosis of the proximal phalanx. A curette was used to remove some of the cartilage, and the remaining cartilage was removed using a burr until punctate bleeding could be noted on the metatarsal head and base of proximal phalanx. The site as flushed with copious amounts of sterile saline.  The distal aspect of the 1st metatarsal and the base of the proximal phalanx were then subchondrally drilled utilizing a pineapple burr. A Temporary K wire was then placed from distal-medial to proximal-lateral across the 1st MPJ, and the position was confirmed to be satisfactory utilizing fluoroscopy.. A dorsal right1st MPJ fusion locking plate was then applied and secured with two olive wires.  No interfragmentary screw was inserted in lag fashion standard 4.0 cancellous.  The position was confirmed  to be satisfactory with fluoroscopy, and two screws were  placed both proximally and distally for a total of four Nonlocking/locking screws utilized. The 1st MPJ was then stressed intraoperatively and no motion or gapping was noted across the fusion site. Final imaging via fluoroscopy was taken to confirm adequate placement and rectus position of the hallux. The surgical site was copiously irrigated with sterile saline. The capsule was then reapproximated with 3-0 Monocortical in a simple interrupted fashion. The subcutaneous tissue was then reapproximated with 4-0 monocryl in a running fashion. The subcuticular was performed utilizing 5-0 Monocryl.  Attention was directed to the second digit of the foot A longitudinal incision was made over the proximal interphalangeal joint of the second digit of the foot. A very small incision was made and carried deep with a combination of sharp and blunt dissection to the level of the distal interphalangeal joint where longitudinal tenotomy was performed. A capsulotomy was performed revealing the head of theproximal phalanx through the wound. The head of this phalanx was sharply resected with the sagittal saw, recontoured to a normal anatomical resemblance.  It was also noted patient will need a further release at the metatarsophalangeal joint at this time using McGlamery elevator the incision incision was carried further back and using McGlamery elevator the second metatarsal head capsulotomy was performed in standard technique.  Then a 0.0 45inch Kwire was placed across the osteotomy site for stabilization this was inserted into respective metatarsal heads. correctional alignment noted as seen on fluoroscopyThe site was flushed, and the tendon was repaired with 3-0 Monocryl. The cutaneous closure was made with a 3-0 Prolene in a simple fashion.  At the conclusion of the procedure the patient was awoken from anesthesia and found to have tolerated the procedure well any complications. There were transferred to PACU with  vital signs stable and vascular status intact.  Nicholes Rough, DPM

## 2023-01-19 ENCOUNTER — Emergency Department (HOSPITAL_COMMUNITY): Payer: 59

## 2023-01-19 ENCOUNTER — Ambulatory Visit (HOSPITAL_COMMUNITY)
Admission: EM | Admit: 2023-01-19 | Discharge: 2023-01-19 | Disposition: A | Discharge status: Home / Self Care | Payer: 59 | Source: Home / Self Care

## 2023-01-19 ENCOUNTER — Other Ambulatory Visit: Payer: Self-pay

## 2023-01-19 ENCOUNTER — Encounter (HOSPITAL_COMMUNITY): Payer: Self-pay

## 2023-01-19 ENCOUNTER — Emergency Department (HOSPITAL_COMMUNITY)
Admission: EM | Admit: 2023-01-19 | Discharge: 2023-01-19 | Disposition: A | Discharge status: Home / Self Care | Payer: 59

## 2023-01-19 ENCOUNTER — Encounter (HOSPITAL_COMMUNITY): Payer: Self-pay | Admitting: *Deleted

## 2023-01-19 DIAGNOSIS — T8130XA Disruption of wound, unspecified, initial encounter: Secondary | ICD-10-CM | POA: Diagnosis not present

## 2023-01-19 DIAGNOSIS — T8149XA Infection following a procedure, other surgical site, initial encounter: Secondary | ICD-10-CM

## 2023-01-19 DIAGNOSIS — M79672 Pain in left foot: Secondary | ICD-10-CM | POA: Diagnosis present

## 2023-01-19 LAB — CBC
HCT: 43.9 % (ref 39.0–52.0)
Hemoglobin: 14.8 g/dL (ref 13.0–17.0)
MCH: 31.1 pg (ref 26.0–34.0)
MCHC: 33.7 g/dL (ref 30.0–36.0)
MCV: 92.2 fL (ref 80.0–100.0)
Platelets: 282 10*3/uL (ref 150–400)
RBC: 4.76 MIL/uL (ref 4.22–5.81)
RDW: 14.6 % (ref 11.5–15.5)
WBC: 4.7 10*3/uL (ref 4.0–10.5)
nRBC: 0 % (ref 0.0–0.2)

## 2023-01-19 LAB — COMPREHENSIVE METABOLIC PANEL
ALT: 26 U/L (ref 0–44)
AST: 29 U/L (ref 15–41)
Albumin: 3.3 g/dL — ABNORMAL LOW (ref 3.5–5.0)
Alkaline Phosphatase: 57 U/L (ref 38–126)
Anion gap: 7 (ref 5–15)
BUN: 5 mg/dL — ABNORMAL LOW (ref 8–23)
CO2: 23 mmol/L (ref 22–32)
Calcium: 8 mg/dL — ABNORMAL LOW (ref 8.9–10.3)
Chloride: 103 mmol/L (ref 98–111)
Creatinine, Ser: 0.82 mg/dL (ref 0.61–1.24)
GFR, Estimated: >60 mL/min (ref >60)
Glucose, Bld: 94 mg/dL (ref 70–99)
Potassium: 3.4 mmol/L — ABNORMAL LOW (ref 3.5–5.1)
Sodium: 133 mmol/L — ABNORMAL LOW (ref 135–145)
Total Bilirubin: 0.4 mg/dL (ref 0.3–1.2)
Total Protein: 6.2 g/dL — ABNORMAL LOW (ref 6.5–8.1)

## 2023-01-19 LAB — URINALYSIS, ROUTINE W REFLEX MICROSCOPIC
Bilirubin Urine: NEGATIVE
Glucose, UA: NEGATIVE mg/dL
Hgb urine dipstick: NEGATIVE
Ketones, ur: NEGATIVE mg/dL
Leukocytes,Ua: NEGATIVE
Nitrite: NEGATIVE
Protein, ur: NEGATIVE mg/dL
Specific Gravity, Urine: 1.002 — ABNORMAL LOW (ref 1.005–1.030)
pH: 5 (ref 5.0–8.0)

## 2023-01-19 NOTE — ED Notes (Signed)
Pt provided with crutches and educated on proper use.

## 2023-01-19 NOTE — Discharge Instructions (Signed)
You have an infected surgical wound which is not healing well.  You also have an implant in your toe. This needs to be further evaluated in the emergency room.  Please head to the ER now for further assessment and workup.

## 2023-01-19 NOTE — ED Notes (Signed)
Patient is being discharged from the Urgent Care and sent to the Emergency Department via wheelchair with UC staff. Per Guy Sandifer, PA, patient is in need of higher level of care due to foot infection. Patient is aware and verbalizes understanding of plan of care.  Vitals:   01/19/23 1553  BP: 121/77  Pulse: 91  Resp: 16  Temp: 98.2 F (36.8 C)  SpO2: 98%

## 2023-01-19 NOTE — ED Triage Notes (Signed)
Pt presents to office left foot pain and swelling. Pt stated he had surgery on his left foot on 01/16/2023. Pt does not recalled what he had done to his foot.

## 2023-01-19 NOTE — ED Provider Notes (Signed)
[place] EMERGENCY DEPARTMENT AT [facility] Provider Note   CSN: [id number] Arrival date & time: 01/19/23  1638     History  Chief Complaint  Patient presents with   Foot Pain    Leonard Ramos is a 67 y.o. male.  67 year old male presenting emergency department for further evaluation.  Had recent surgery reports a week ago evaluated urgent care and sent to the emergency department for further evaluation.  States he went to urgent care because his bandages started having blood to them a couple days ago.  He reports that he has somewhat been walking on the foot.  He denies any fevers, chills, purulent drainage.  No numbness tingling changes in sensation.   Foot Pain       Home Medications Prior to Admission medications   Medication Sig Start Date End Date Taking? Authorizing Provider  folic acid (FOLVITE) 1 MG tablet Take 1 tablet (1 mg total) by mouth daily. 04/18/20   Kallie Locks, FNP  gabapentin (NEURONTIN) 600 MG tablet Take 1 tablet (600 mg total) by mouth 3 (three) times daily. 04/27/22 04/27/23  Ivonne Andrew, NP  HYDROcodone-acetaminophen (NORCO/VICODIN) 5-325 MG tablet Take 1-2 tablets by mouth every 4 (four) hours as needed for moderate pain or severe pain ((score 4 to 6)). 04/10/22   Val Eagle D, NP  ibuprofen (ADVIL) 800 MG tablet Take 1 tablet (800 mg total) by mouth every 6 (six) hours as needed. 01/14/23   Candelaria Stagers, DPM  omeprazole (PRILOSEC) 20 MG capsule Take 1 capsule (20 mg total) by mouth daily. Patient taking differently: Take 20 mg by mouth every morning. 04/27/22   Ivonne Andrew, NP  oxyCODONE-acetaminophen (PERCOCET) 5-325 MG tablet Take 1 tablet by mouth every 4 (four) hours as needed for severe pain. 01/14/23   Candelaria Stagers, DPM  pravastatin (PRAVACHOL) 20 MG tablet TAKE 1 TABLET (20 MG TOTAL) BY MOUTH DAILY. Patient taking differently: Take 20 mg by mouth at bedtime. 04/27/22 04/27/23  Ivonne Andrew, NP       Allergies    Trazodone and nefazodone    Review of Systems   Review of Systems  Physical Exam Updated Vital Signs BP 113/70 (BP Location: Right Arm)   Pulse 91   Temp 97.9 F (36.6 C) (Oral)   Resp 16   Ht 5\' 11"  (1.803 m)   Wt 68.5 kg   SpO2 97%   BMI 21.06 kg/m  Physical Exam Vitals and nursing note reviewed.  Eyes:     Conjunctiva/sclera: Conjunctivae normal.  Cardiovascular:     Rate and Rhythm: Normal rate and regular rhythm.  Pulmonary:     Effort: Pulmonary effort is normal.  Abdominal:     General: Abdomen is flat. There is no distension.     Tenderness: There is no abdominal tenderness.  Musculoskeletal:     Comments: Normal sensation.  Appears to have dehisced second toe suture.  No active bleeding at this time.  No purulence or induration fluctuance.  Skin:    Capillary Refill: Capillary refill takes less than 2 seconds.  Neurological:     Mental Status: He is alert and oriented to person, place, and time.  Psychiatric:        Mood and Affect: Mood normal.        Behavior: Behavior normal.     ED Results / Procedures / Treatments   Labs (all labs ordered are listed, but only abnormal results are displayed) Labs  Reviewed  COMPREHENSIVE METABOLIC PANEL - Abnormal; Notable for the following components:      Result Value   Sodium 133 (*)    Potassium 3.4 (*)    BUN 5 (*)    Calcium 8.0 (*)    Total Protein 6.2 (*)    Albumin 3.3 (*)    All other components within normal limits  URINALYSIS, ROUTINE W REFLEX MICROSCOPIC - Abnormal; Notable for the following components:   Color, Urine STRAW (*)    Specific Gravity, Urine 1.002 (*)    All other components within normal limits  CBC    EKG None  Radiology No results found.  Procedures Procedures    Medications Ordered in ED Medications - No data to display  ED Course/ Medical Decision Making/ A&P                                 Medical Decision Making Well-appearing 67 year old male  presenting for evaluation of his foot in the setting of recent surgery.  No fever tachycardia or leukocytosis to suggest infection.  Surgical site appears to have dehisced over second toe, however somewhat approximated.  Given his symptoms started a couple days ago we will forego closure/repair at this time. he is mild hyponatremia.  Normal kidney function.  No transaminitis.  UA negative.  Encouraged patient to follow-up with the podiatrist who did the surgery.  Wounds were dressed.  He is requesting crutches.  Stable for discharge.  Amount and/or Complexity of Data Reviewed Radiology: ordered.         Final Clinical Impression(s) / ED Diagnoses Final diagnoses:  Wound dehiscence    Rx / DC Orders ED Discharge Orders     None         Coral Spikes, DO 01/19/23 2309

## 2023-01-19 NOTE — ED Triage Notes (Signed)
The pt has  lt foot surgery on Monday  he does not know what type of surgery.  The foot is hurting and he thinks it may be infected.

## 2023-01-19 NOTE — ED Provider Notes (Signed)
[facility]    CSN: [id number] Arrival date & time: 01/19/23  1500      History   Chief Complaint No chief complaint on file.   HPI Leonard Ramos is a 67 y.o. male.   67yo male presents today for eval of his L foot. He had a foot procedure on Monday 8/26. Capsulotomy MPJ release, Hammertoe correct, Hallux MPJ fusion. He reports "I was given no instructions at discharge." He lives alone. He fell on 8/27. He did not seek medical attention until today. States he is concerned about the amount of blood noted on his guaze and sock. He has not changed the dressing since his surgery. He denies any sensation to his foot and reports complete numbness from his L knee down to his toes. Denies any pain but is concerned with "the appearance of the sock."      Past Medical History:  Diagnosis Date   Alcohol use    drinks 2 40's per day   Anxiety    Aortic atherosclerosis (HCC)    Arthritis    Cervical spondylosis with myelopathy and radiculopathy    Chronic low back pain    Cocaine abuse (HCC)    last used 01-09-23   Depression    ED (erectile dysfunction)    Full dentures    GERD (gastroesophageal reflux disease)    Hammertoe of left foot    Hyperlipidemia    Liver disease    Lumbosacral spondylolysis    Marijuana abuse    uses marijuana everyday   Neuropathy    hands tingling and feet numbness   Periodontitis, chronic, generalized    Poor dentition    Substance abuse (HCC)    Tobacco use    Vitamin D deficiency 03/2019    Patient Active Problem List   Diagnosis Date Noted   Chronic low back pain 08/16/2022   Pain due to onychomycosis of toenails of both feet 05/02/2022   Hav (hallux abducto valgus), unspecified laterality 05/02/2022   Hammer toe of second toe of left foot 05/02/2022   Callus between toes 04/27/2022   Lumbar stenosis with neurogenic claudication 04/09/2022   Lumbosacral spondylolysis 01/26/2022   Cervical spondylosis with myelopathy and  radiculopathy 02/28/2021   Tobacco use 07/12/2019   History of hyperglycemia 07/12/2019   Erectile dysfunction 07/12/2019   Chronic left-sided low back pain without sciatica 04/10/2019   Alcohol use 04/10/2019   Poor dentition 04/10/2019   Chronic dental pain 04/10/2019   Urinary frequency 04/10/2019   Aortic atherosclerosis (HCC) 03/19/2017   Generalized anxiety disorder 10/07/2015   Depression 10/07/2015   Neuropathy 09/06/2015   Loss of weight 09/06/2015   Tobacco dependence 09/06/2015   Bowel incontinence 03/09/2015   ETOH abuse 03/09/2015   Left sided numbness 03/08/2015   Left-sided weakness 03/08/2015   Absence of bladder continence 03/08/2015    Past Surgical History:  Procedure Laterality Date   ANTERIOR CERVICAL DECOMP/DISCECTOMY FUSION N/A 02/28/2021   Procedure: Anterior Cervical Discectomy Fusion - Cervical three-Cervical four - Cervical four-Cervical five - Cervical five-Cervical six;  Surgeon: Julio Sicks, MD;  Location: [facility] OR;  Service: Neurosurgery;  Laterality: N/A;   CAPSULOTOMY Left 01/14/2023   Procedure: CAPSULOTOMY MPJ RELEASE;  Surgeon: Candelaria Stagers, DPM;  Location: [facility] ORS;  Service: Orthopedics/Podiatry;  Laterality: Left;   HALLUX VALGUS LAPIDUS Left 01/14/2023   Procedure: HALLUX MPJ FUSION;  Surgeon: Candelaria Stagers, DPM;  Location: [facility] ORS;  Service: Orthopedics/Podiatry;  Laterality: Left;  HAMMER TOE SURGERY Left 01/14/2023   Procedure: HAMMER TOE CORRECTION;  Surgeon: Candelaria Stagers, DPM;  Location: [facility] ORS;  Service: Orthopedics/Podiatry;  Laterality: Left;   LUMBAR LAMINECTOMY/DECOMPRESSION MICRODISCECTOMY N/A 04/09/2022   Procedure: Lumbar Two-Three, Lumbar Three-Four, Lumbar Four-Five Laminectomy and Foraminotomy;  Surgeon: Julio Sicks, MD;  Location: [facility] OR;  Service: Neurosurgery;  Laterality: N/A;   LUMBAR WOUND DEBRIDEMENT N/A 04/09/2022   Procedure: Reexploration of Lumbar wound to remove hematoma;  Surgeon: Julio Sicks, MD;  Location: [facility]  OR;  Service: Neurosurgery;  Laterality: N/A;   MULTIPLE EXTRACTIONS WITH ALVEOLOPLASTY Bilateral 09/04/2019   Procedure: MULTIPLE EXTRACTION W/ ALVEOLOPLASTY, Removal of left maxillary buccal exostosis;  Surgeon: Ocie Doyne, DDS;  Location: [facility] OR;  Service: Oral Surgery;  Laterality: Bilateral;       Home Medications    Prior to Admission medications   Medication Sig Start Date End Date Taking? Authorizing Provider  folic acid (FOLVITE) 1 MG tablet Take 1 tablet (1 mg total) by mouth daily. 04/18/20  Yes Kallie Locks, FNP  gabapentin (NEURONTIN) 600 MG tablet Take 1 tablet (600 mg total) by mouth 3 (three) times daily. 04/27/22 04/27/23 Yes Ivonne Andrew, NP  HYDROcodone-acetaminophen (NORCO/VICODIN) 5-325 MG tablet Take 1-2 tablets by mouth every 4 (four) hours as needed for moderate pain or severe pain ((score 4 to 6)). 04/10/22  Yes Val Eagle D, NP  ibuprofen (ADVIL) 800 MG tablet Take 1 tablet (800 mg total) by mouth every 6 (six) hours as needed. 01/14/23  Yes Candelaria Stagers, DPM  omeprazole (PRILOSEC) 20 MG capsule Take 1 capsule (20 mg total) by mouth daily. Patient taking differently: Take 20 mg by mouth every morning. 04/27/22  Yes Ivonne Andrew, NP  oxyCODONE-acetaminophen (PERCOCET) 5-325 MG tablet Take 1 tablet by mouth every 4 (four) hours as needed for severe pain. 01/14/23  Yes Candelaria Stagers, DPM  pravastatin (PRAVACHOL) 20 MG tablet TAKE 1 TABLET (20 MG TOTAL) BY MOUTH DAILY. Patient taking differently: Take 20 mg by mouth at bedtime. 04/27/22 04/27/23 Yes Ivonne Andrew, NP    Family History Family History  Problem Relation Age of Onset   CAD Father     Social History Social History   Tobacco Use   Smoking status: Every Day    Current packs/day: 1.00    Average packs/day: 1 pack/day for 40.0 years (40.0 ttl pk-yrs)    Types: Cigarettes   Smokeless tobacco: Never  Vaping Use   Vaping status: Never Used  Substance Use Topics   Alcohol use: Yes     Alcohol/week: 40.0 standard drinks of alcohol    Types: 40 Standard drinks or equivalent per week    Comment: States I drink beer all day long. Patient states he drinks 2 40's per day   Drug use: Yes    Types: Marijuana, Cocaine    Comment: Patient states "I smoke marijuana everyday."  Last use cocaine 01-09-23     Allergies   Trazodone and nefazodone   Review of Systems Review of Systems As per HPI  Physical Exam Triage Vital Signs ED Triage Vitals [01/19/23 1553]  Encounter Vitals Group     BP 121/77     Systolic BP Percentile      Diastolic BP Percentile      Pulse Rate 91     Resp 16     Temp 98.2 F (36.8 C)     Temp Source Oral     SpO2 98 %  Weight      Height      Head Circumference      Peak Flow      Pain Score      Pain Loc      Pain Education      Exclude from Growth Chart    No data found.  Updated Vital Signs BP 121/77 (BP Location: Left Arm)   Pulse 91   Temp 98.2 F (36.8 C) (Oral)   Resp 16   SpO2 98%   Visual Acuity Right Eye Distance:   Left Eye Distance:   Bilateral Distance:    Right Eye Near:   Left Eye Near:    Bilateral Near:     Physical Exam Vitals and nursing note reviewed.  HENT:     Head: Normocephalic.  Eyes:     Comments: B arcus senilus  Cardiovascular:     Rate and Rhythm: Normal rate.  Pulmonary:     Effort: Pulmonary effort is normal. No respiratory distress.  Musculoskeletal:        General: Swelling and deformity present.  Feet:     Left foot:     Skin integrity: Skin breakdown, erythema and warmth present.     Toenail Condition: Left toenails are abnormally thick and long.     Comments: 2nd digit appears to have wound dehiscence.  Significant swelling and maceration noted to dorsal foot Fetid odor noted from foot External hardware noted to distal phalanx 2nd digit  Swelling noted to entire foot and ankle with warmth extending up leg to just below L knee. Skin:    General: Skin is warm.      Findings: Bruising and erythema present.  Neurological:     Mental Status: He is alert.     Sensory: Sensory deficit (unable to feel entire L foot or lower L leg) present.     Gait: Gait abnormal.               UC Treatments / Results  Labs (all labs ordered are listed, but only abnormal results are displayed) Labs Reviewed - No data to display  EKG   Radiology No results found.  Procedures Procedures (including critical care time)  Medications Ordered in UC Medications - No data to display  Initial Impression / Assessment and Plan / UC Course  I have reviewed the triage vital signs and the nursing notes.  Pertinent labs & imaging results that were available during my care of the patient were reviewed by me and considered in my medical decision making (see chart for details).     Infected surgical wound - pt is unable to provide clear details regarding his current state of health. He is uncertain if he has been ambulating on his foot, but has a walker in the office. He admits to a fall on 01/15/23. He has not changed the bandage. The appearance, warmth and odor of foot/ leg suggest significant infection. Pt does not have a fever, however I am concerned about the severity and feel additional workup past the ability of the urgent care is warranted. Pt was instructed to head to the ER for a further assessment and workup. Neuropathy - chronic. Pt on gabapentin.    Final Clinical Impressions(s) / UC Diagnoses   Final diagnoses:  Infected surgical wound     Discharge Instructions      You have an infected surgical wound which is not healing well.  You also have an implant in your toe. This  needs to be further evaluated in the emergency room.  Please head to the ER now for further assessment and workup.     ED Prescriptions   None    PDMP not reviewed this encounter.   Maretta Bees, [place] 01/19/23 [phone]

## 2023-01-19 NOTE — Discharge Instructions (Signed)
Please call the doctor that did your surgery to schedule an appointment to see them.  Return immediately felt fevers, chills, worsening pain, bleeding.  Foot becomes red hot develops odor, starts draining pus or you develop any new or worsening symptoms that are concerning to you.

## 2023-01-22 ENCOUNTER — Telehealth: Payer: Self-pay | Admitting: Podiatry

## 2023-01-22 NOTE — Telephone Encounter (Signed)
called his sutures call a lose and have he went to the er and they did not wrp the foot well and he would like to come to get it rewrapped

## 2023-01-23 ENCOUNTER — Ambulatory Visit (INDEPENDENT_AMBULATORY_CARE_PROVIDER_SITE_OTHER): Payer: 59 | Admitting: Podiatry

## 2023-01-23 ENCOUNTER — Ambulatory Visit (INDEPENDENT_AMBULATORY_CARE_PROVIDER_SITE_OTHER): Payer: 59

## 2023-01-23 DIAGNOSIS — M19072 Primary osteoarthritis, left ankle and foot: Secondary | ICD-10-CM

## 2023-01-23 DIAGNOSIS — M2042 Other hammer toe(s) (acquired), left foot: Secondary | ICD-10-CM

## 2023-01-23 DIAGNOSIS — Z9889 Other specified postprocedural states: Secondary | ICD-10-CM

## 2023-01-23 MED ORDER — DOXYCYCLINE HYCLATE 100 MG PO TABS: 100.0000 mg | ORAL_TABLET | Freq: Two times a day (BID) | ORAL | 0 refills | Status: DC

## 2023-01-23 NOTE — Progress Notes (Signed)
Subjective:  Patient ID: Leonard Ramos, male    DOB: 1955-11-19,  MRN: [id number]  Chief Complaint  Patient presents with   Routine Post Op    POV # 1 DOS 01/14/23 --- LEFT FIRST METAPHALANGEAL JOINT FUSION WITH SECOND HAMMERTOE REPAIR WITH FIXATION    DOS: 01/14/2023 Procedure: Left first metatarsophalangeal joint fusion with left second digit hammertoe contracture with capsulotomy  67 y.o. male returns for post-op check.  Patient states is doing well.  He was not given a boot or surgical shoe from the surgery center and he has been putting more weight on his foot.  He has been noncompliant get that.  No nausea fever chills vomiting.  Review of Systems: Negative except as noted in the HPI. Denies N/V/F/Ch.  Past Medical History:  Diagnosis Date   Alcohol use    drinks 2 40's per day   Anxiety    Aortic atherosclerosis (HCC)    Arthritis    Cervical spondylosis with myelopathy and radiculopathy    Chronic low back pain    Cocaine abuse (HCC)    last used 01-09-23   Depression    ED (erectile dysfunction)    Full dentures    GERD (gastroesophageal reflux disease)    Hammertoe of left foot    Hyperlipidemia    Liver disease    Lumbosacral spondylolysis    Marijuana abuse    uses marijuana everyday   Neuropathy    hands tingling and feet numbness   Periodontitis, chronic, generalized    Poor dentition    Substance abuse (HCC)    Tobacco use    Vitamin D deficiency 03/2019    Current Outpatient Medications:    doxycycline (VIBRA-TABS) 100 MG tablet, Take 1 tablet (100 mg total) by mouth 2 (two) times daily., Disp: 20 tablet, Rfl: 0   folic acid (FOLVITE) 1 MG tablet, Take 1 tablet (1 mg total) by mouth daily., Disp: 90 tablet, Rfl: 3   gabapentin (NEURONTIN) 600 MG tablet, Take 1 tablet (600 mg total) by mouth 3 (three) times daily., Disp: 270 tablet, Rfl: 3   HYDROcodone-acetaminophen (NORCO/VICODIN) 5-325 MG tablet, Take 1-2 tablets by mouth every 4 (four) hours as  needed for moderate pain or severe pain ((score 4 to 6))., Disp: 30 tablet, Rfl: 0   ibuprofen (ADVIL) 800 MG tablet, Take 1 tablet (800 mg total) by mouth every 6 (six) hours as needed., Disp: 60 tablet, Rfl: 1   omeprazole (PRILOSEC) 20 MG capsule, Take 1 capsule (20 mg total) by mouth daily. (Patient taking differently: Take 20 mg by mouth every morning.), Disp: 30 capsule, Rfl: 0   oxyCODONE-acetaminophen (PERCOCET) 5-325 MG tablet, Take 1 tablet by mouth every 4 (four) hours as needed for severe pain., Disp: 30 tablet, Rfl: 0   pravastatin (PRAVACHOL) 20 MG tablet, TAKE 1 TABLET (20 MG TOTAL) BY MOUTH DAILY. (Patient taking differently: Take 20 mg by mouth at bedtime.), Disp: 90 tablet, Rfl: 3  Social History   Tobacco Use  Smoking Status Every Day   Current packs/day: 1.00   Average packs/day: 1 pack/day for 40.0 years (40.0 ttl pk-yrs)   Types: Cigarettes  Smokeless Tobacco Never    Allergies  Allergen Reactions   Trazodone And Nefazodone     nightmares   Objective:  There were no vitals filed for this visit. There is no height or weight on file to calculate BMI. Constitutional Well developed. Well nourished.  Vascular Foot warm and well perfused. Capillary refill normal  to all digits.   Neurologic Normal speech. Oriented to person, place, and time. Epicritic sensation to light touch grossly present bilaterally.  Dermatologic Skin healing well without signs of infection. Skin edges well coapted without signs of infection.  Orthopedic: Tenderness to palpation noted about the surgical site.   Radiographs: 3 views of skeletally mature the left foot: Bending of the K wire noted of the second metatarsophalangeal joint with loss of reduction of the deformity.  First metatarsophalangeal joint hardware is intact no signs of broken hardware noted. Assessment:   1. Arthritis of first metatarsophalangeal (MTP) joint of left foot   2. Hammer toe of second toe of left foot   3.  Status post foot surgery    Plan:  Patient was evaluated and treated and all questions answered.  S/p foot surgery left -Progressing as expected post-operatively. -XR: see above  -WB Status: NWB LLE in CAM boot.  New cam boot was dispensed. -Sutures: None -Medications: None  -Foot redressed.  No follow-ups on file.

## 2023-01-28 ENCOUNTER — Other Ambulatory Visit: Payer: Self-pay | Admitting: Nurse Practitioner

## 2023-01-28 ENCOUNTER — Ambulatory Visit: Payer: 59 | Attending: Nurse Practitioner | Admitting: Audiologist

## 2023-01-28 DIAGNOSIS — I700 Atherosclerosis of aorta: Secondary | ICD-10-CM

## 2023-01-28 DIAGNOSIS — Z760 Encounter for issue of repeat prescription: Secondary | ICD-10-CM

## 2023-02-05 ENCOUNTER — Encounter: Payer: Self-pay | Admitting: Podiatry

## 2023-02-06 ENCOUNTER — Encounter: Payer: 59 | Admitting: Podiatry

## 2023-02-06 ENCOUNTER — Encounter: Payer: 59 | Admitting: *Deleted

## 2023-02-06 VITALS — BP 117/78 | HR 100 | Temp 98.1°F | Resp 22 | Ht 71.0 in | Wt 151.0 lb

## 2023-02-06 DIAGNOSIS — Z006 Encounter for examination for normal comparison and control in clinical research program: Secondary | ICD-10-CM

## 2023-02-06 NOTE — Research (Addendum)
[facility]  Informed Consent   Subject Name: Leonard Ramos  Subject met inclusion and exclusion criteria.  The informed consent form, study requirements and expectations were reviewed with the subject and questions and concerns were addressed prior to the signing of the consent form.  The subject verbalized understanding of the trial requirements.  The subject agreed to participate in the AA HE[facility]  trial and signed the informed consent at 10:25 AM  on 02-06-2023.  The informed consent was obtained prior to performance of any protocol-specific procedures for the subject.  A copy of the signed informed consent was given to the subject and a copy was placed in the subject's medical record.   [place] Aurel Nguyen, Research Coordinator    ACCESSION NO. [id number]                                             Page 1 of 1                                                        INVESTIGATOR: [phone])                          PROTOCOL   [id number]                     Thomasene Ripple, M.D.                              INVESTIGATOR NO.: [id number]                     c/o [facility]                         SUBJECT NUMBER: [id number]                     [facility]                 SUBJECT INITIALS NOT COLLECTED:                     [address]                          VISIT: D0                     [place], [place] 0                   SPONSOR REPORT TO:                 COLLECTION TIME:10:57 DATE:06-Feb-2023                     Cruzita Lederer                 DATE RECEIVED IN LABORATORY: 07-Feb-2023                     c/o Sponsor(or Addtl) [facility].Study DATE  REPORTED BY LABORATORY: 07-Feb-2023                     [facility]                          SEX: M  AGE: [id number]                     [id number]                   [place], [place] [phone]                                                                                        Ref. Ranges               Clinical     Comments                                                                          Significance                                                                            Yes*  No                    HEMATOLOGY&DIFFERENTIAL PANEL                      HGB            15.8         12.5-17.0 g/dL                      HCT            48           37-51 %                      RBC            5.0          4.0-5.8 x106/uL                      MCH            32           26-34 pg                      MCHC           33  31-38 g/dL                      RDW            15.2    H    12.0-15.0 %                      RBC Morph      No Review Required                        MCV            97           80-100 fL                      WBC            4.77         3.80-10.70 x103/uL                      Neutrophil     2.97         1.96-7.23 x103/uL                      Lymphocyte     1.33         0.80-3.00 x103/uL                      Monocytes      0.30         0.12-0.92 x103/uL                      Eosinophil     0.10         0.00-0.57 x103/uL                      Basophils      0.06         0.00-0.20 x103/uL                      Neutrophil     62.3         40.5-75.0 %                      Lymphocyte     27.8         15.4-48.5%                      Monocytes      6.4          2.6-10.1 %                      Eosinophil     2.2          0.0-6.8 %                      Basophils      1.3          0.0-2.0 %                      Platelets      230          130-394 x103/uL  ANC  ANC            2.97         1.96-7.23 x103/uL                    HBA1C                      Fast HbA1c     5.5          <6.5%  RETICULOCYTES                      Retic %        1.5          0.6-2.5 %                      Retic Abs      0.073        0.030-0.130 x106/uL

## 2023-02-06 NOTE — Progress Notes (Signed)
Patient here for enrollment in [facility].  No defined cardiac disease except mild diastolic and systolic dysfunction by echo.  No chest pain.  No known CAD.  Has had recent foot surgery and is currently in a boot.  Was seen in the ER for a wound issue.   BP 117/78  BP Location Left Arm  Pulse 100  Resp 22  Temp 98.1 F (36.7 C)  SpO2 100 %  Weight 151 lb 0.2 oz (68.5 kg)  Height 5\' 11"  (1.803 m)   Alert, oriented AA male in NAD No carotid bruits No JVD Lungs clear Cor reg Abd soft no obvious masses Ext   no edema R with good pulses RLE.  LLE in soft boot due to recent surgery.    Objectives of study, expected findings, and pathophysiology of Lpa explained to patient.  He consents to participate.   Arturo Morton. Riley Kill, MD, [facility] Medical Director, [facility]

## 2023-02-08 ENCOUNTER — Ambulatory Visit (INDEPENDENT_AMBULATORY_CARE_PROVIDER_SITE_OTHER): Payer: 59 | Admitting: Podiatry

## 2023-02-08 ENCOUNTER — Telehealth: Payer: Self-pay | Admitting: Podiatry

## 2023-02-08 DIAGNOSIS — M2042 Other hammer toe(s) (acquired), left foot: Secondary | ICD-10-CM

## 2023-02-08 DIAGNOSIS — Z9889 Other specified postprocedural states: Secondary | ICD-10-CM

## 2023-02-08 DIAGNOSIS — M19072 Primary osteoarthritis, left ankle and foot: Secondary | ICD-10-CM

## 2023-02-08 DIAGNOSIS — Z91199 Patient's noncompliance with other medical treatment and regimen due to unspecified reason: Secondary | ICD-10-CM

## 2023-02-08 MED ORDER — OXYCODONE-ACETAMINOPHEN 5-325 MG PO TABS: 1.0000 | ORAL_TABLET | ORAL | 0 refills | Status: DC | PRN

## 2023-02-08 NOTE — Progress Notes (Signed)
Subjective:  Patient ID: Leonard Ramos, male    DOB: January 29, 1956,  MRN: [id number]  Chief Complaint  Patient presents with   Routine Post Op    POV # 2 DOS 01/14/23 --- LEFT FIRST METAPHALANGEAL JOINT FUSION WITH SECOND HAMMERTOE REPAIR WITH FIXATION Pt stated that he does not sleep in the boot     DOS: 01/14/2023 Procedure: Left first metatarsophalangeal joint fusion with left second digit hammertoe contracture with capsulotomy  67 y.o. male returns for post-op check.  Patient states is doing well.  He was not given a boot or surgical shoe from the surgery center and he has been putting more weight on his foot.  He has been noncompliant get that.  No nausea fever chills vomiting.  Review of Systems: Negative except as noted in the HPI. Denies N/V/F/Ch.  Past Medical History:  Diagnosis Date   Alcohol use    drinks 2 40's per day   Anxiety    Aortic atherosclerosis (HCC)    Arthritis    Cervical spondylosis with myelopathy and radiculopathy    Chronic low back pain    Cocaine abuse (HCC)    last used 01-09-23   Depression    ED (erectile dysfunction)    Full dentures    GERD (gastroesophageal reflux disease)    Hammertoe of left foot    Hyperlipidemia    Liver disease    Lumbosacral spondylolysis    Marijuana abuse    uses marijuana everyday   Neuropathy    hands tingling and feet numbness   Periodontitis, chronic, generalized    Poor dentition    Substance abuse (HCC)    Tobacco use    Vitamin D deficiency 03/2019    Current Outpatient Medications:    doxycycline (VIBRA-TABS) 100 MG tablet, Take 1 tablet (100 mg total) by mouth 2 (two) times daily., Disp: 20 tablet, Rfl: 0   folic acid (FOLVITE) 1 MG tablet, Take 1 tablet (1 mg total) by mouth daily., Disp: 90 tablet, Rfl: 3   gabapentin (NEURONTIN) 600 MG tablet, Take 1 tablet (600 mg total) by mouth 3 (three) times daily., Disp: 270 tablet, Rfl: 3   HYDROcodone-acetaminophen (NORCO/VICODIN) 5-325 MG tablet,  Take 1-2 tablets by mouth every 4 (four) hours as needed for moderate pain or severe pain ((score 4 to 6))., Disp: 30 tablet, Rfl: 0   ibuprofen (ADVIL) 800 MG tablet, Take 1 tablet (800 mg total) by mouth every 6 (six) hours as needed., Disp: 60 tablet, Rfl: 1   omeprazole (PRILOSEC) 20 MG capsule, Take 1 capsule (20 mg total) by mouth daily. (Patient taking differently: Take 20 mg by mouth every morning.), Disp: 30 capsule, Rfl: 0   oxyCODONE-acetaminophen (PERCOCET) 5-325 MG tablet, Take 1 tablet by mouth every 4 (four) hours as needed for severe pain., Disp: 30 tablet, Rfl: 0   pravastatin (PRAVACHOL) 20 MG tablet, TAKE 1 TABLET BY MOUTH DAILY, Disp: 100 tablet, Rfl: 2  Social History   Tobacco Use  Smoking Status Every Day   Current packs/day: 1.00   Average packs/day: 1 pack/day for 40.0 years (40.0 ttl pk-yrs)   Types: Cigarettes  Smokeless Tobacco Never    Allergies  Allergen Reactions   Trazodone And Nefazodone     nightmares   Objective:  There were no vitals filed for this visit. There is no height or weight on file to calculate BMI. Constitutional Well developed. Well nourished.  Vascular Foot warm and well perfused. Capillary refill normal to all  digits.   Neurologic Normal speech. Oriented to person, place, and time. Epicritic sensation to light touch grossly present bilaterally.  Dermatologic Skin completely epithelialized.  No signs of Deis is noted.  First metatarsophalangeal joint correction is well aligned and healed really well.  The second digit has a rotational deformity with possible recurrence of hammertoe due to noncompliance  Orthopedic: Tenderness to palpation noted about the surgical site.   Radiographs: 3 views of skeletally mature the left foot: Bending of the K wire noted of the second metatarsophalangeal joint with loss of reduction of the deformity.  First metatarsophalangeal joint hardware is intact no signs of broken hardware noted. Assessment:    1. Arthritis of first metatarsophalangeal (MTP) joint of left foot   2. Hammer toe of second toe of left foot   3. Status post foot surgery   4. Noncompliance     Plan:  Patient was evaluated and treated and all questions answered.  S/p foot surgery left -Progressing as expected post-operatively. -XR: see above  -WB Status: Slowly begin weightbearing as tolerated in cam boot -Sutures: None -Medications: None  -I made the decision to remove the pain given that it was causing him pain and he was bent weight too much from his noncompliant of walking on it too much. -I discussed with the patient that if there is a recurrence of hammertoe deformity will need to go back in the revised surgical.  He states understanding will think about that. -I will see him back again in 4 weeks and reassess  Return in about 4 weeks (around 03/08/2023) for [employer].

## 2023-02-08 NOTE — Telephone Encounter (Signed)
Patient called. He said the pharmacy has not received his Rx oxyCODONE-acetaminophen

## 2023-02-11 MED ORDER — OXYCODONE-ACETAMINOPHEN 5-325 MG PO TABS: 1.0000 | ORAL_TABLET | ORAL | 0 refills | Status: DC | PRN

## 2023-02-18 ENCOUNTER — Encounter: Payer: Self-pay | Admitting: Nurse Practitioner

## 2023-02-18 ENCOUNTER — Ambulatory Visit (INDEPENDENT_AMBULATORY_CARE_PROVIDER_SITE_OTHER): Payer: 59 | Admitting: Nurse Practitioner

## 2023-02-18 VITALS — BP 125/89 | HR 94 | Temp 96.4°F | Wt 140.6 lb

## 2023-02-18 DIAGNOSIS — N529 Male erectile dysfunction, unspecified: Secondary | ICD-10-CM | POA: Diagnosis not present

## 2023-02-18 DIAGNOSIS — Z0000 Encounter for general adult medical examination without abnormal findings: Secondary | ICD-10-CM

## 2023-02-18 DIAGNOSIS — Z13220 Encounter for screening for lipoid disorders: Secondary | ICD-10-CM | POA: Diagnosis not present

## 2023-02-18 MED ORDER — IBUPROFEN 800 MG PO TABS: 800.0000 mg | ORAL_TABLET | Freq: Four times a day (QID) | ORAL | 1 refills | Status: AC | PRN

## 2023-02-18 MED ORDER — SILDENAFIL CITRATE 100 MG PO TABS
50.0000 mg | ORAL_TABLET | Freq: Every day | ORAL | 11 refills | Status: AC | PRN
Start: 2023-02-18 — End: ?

## 2023-02-18 NOTE — Patient Instructions (Signed)
1. Erectile disorder  - sildenafil (VIAGRA) 100 MG tablet; Take 0.5-1 tablets (50-100 mg total) by mouth daily as needed for erectile dysfunction.  Dispense: 5 tablet; Refill: 11  2. Lipid screening  - Lipid Panel  3. Routine adult health maintenance  - CBC - Comprehensive metabolic panel  Follow up:  Follow up in 6 months

## 2023-02-18 NOTE — Progress Notes (Signed)
Subjective   Patient ID: Leonard Ramos, male    DOB: 1956/01/04, 67 y.o.   MRN: [id number]  Chief Complaint  Patient presents with   Medical Management of Chronic Issues    Referring provider: Ivonne Andrew, NP  Leonard Ramos is a 67 y.o. male with Past Medical History: No date: Alcohol use     Comment:  drinks 2 40's per day No date: Anxiety No date: Aortic atherosclerosis (HCC) No date: Arthritis No date: Cervical spondylosis with myelopathy and radiculopathy No date: Chronic low back pain No date: Cocaine abuse (HCC)     Comment:  last used 01-09-23 No date: Depression No date: ED (erectile dysfunction) No date: Full dentures No date: GERD (gastroesophageal reflux disease) No date: Hammertoe of left foot No date: Hyperlipidemia No date: Liver disease No date: Lumbosacral spondylolysis No date: Marijuana abuse     Comment:  uses marijuana everyday No date: Neuropathy     Comment:  hands tingling and feet numbness No date: Periodontitis, chronic, generalized No date: Poor dentition No date: Substance abuse (HCC) No date: Tobacco use 03/2019: Vitamin D deficiency   HPI  Patient presents today for routine follow-up.  He is requesting a prescription for Viagra for erectile dysfunction today.  Will place an order for this.  Patient did have a recent procedure for MPJ release on 01/14/2023 with Dr. Allena Katz in orthopedics.  Follow-up scheduled with them.  Than that he has been doing well since his last visit here. Denies f/c/s, n/v/d, hemoptysis, PND, leg swelling Denies chest pain or edema    Allergies  Allergen Reactions   Trazodone And Nefazodone     nightmares    Immunization History  Administered Date(s) Administered   Influenza, High Dose Seasonal PF 02/04/2023   Influenza,inj,Quad PF,6+ Mos 03/08/2015, 03/08/2016, 03/06/2017, 03/07/2018, 04/10/2019   Moderna Sars-Covid-2 Vaccination 09/28/2019   Pneumococcal Conjugate-13 08/16/2022   Pneumococcal  Polysaccharide-23 03/08/2015   Tdap 03/08/2016    Tobacco History: Social History   Tobacco Use  Smoking Status Every Day   Current packs/day: 1.00   Average packs/day: 1 pack/day for 40.0 years (40.0 ttl pk-yrs)   Types: Cigarettes  Smokeless Tobacco Never   Ready to quit: Not Answered Counseling given: Not Answered   Outpatient Encounter Medications as of 02/18/2023  Medication Sig   folic acid (FOLVITE) 1 MG tablet Take 1 tablet (1 mg total) by mouth daily.   gabapentin (NEURONTIN) 600 MG tablet Take 1 tablet (600 mg total) by mouth 3 (three) times daily.   omeprazole (PRILOSEC) 20 MG capsule Take 1 capsule (20 mg total) by mouth daily. (Patient taking differently: Take 20 mg by mouth every morning.)   pravastatin (PRAVACHOL) 20 MG tablet TAKE 1 TABLET BY MOUTH DAILY   sildenafil (VIAGRA) 100 MG tablet Take 0.5-1 tablets (50-100 mg total) by mouth daily as needed for erectile dysfunction.   [DISCONTINUED] ibuprofen (ADVIL) 800 MG tablet Take 1 tablet (800 mg total) by mouth every 6 (six) hours as needed.   doxycycline (VIBRA-TABS) 100 MG tablet Take 1 tablet (100 mg total) by mouth 2 (two) times daily. (Patient not taking: Reported on 02/18/2023)   HYDROcodone-acetaminophen (NORCO/VICODIN) 5-325 MG tablet Take 1-2 tablets by mouth every 4 (four) hours as needed for moderate pain or severe pain ((score 4 to 6)). (Patient not taking: Reported on 02/18/2023)   ibuprofen (ADVIL) 800 MG tablet Take 1 tablet (800 mg total) by mouth every 6 (six) hours as needed.   oxyCODONE-acetaminophen (  PERCOCET) 5-325 MG tablet Take 1 tablet by mouth every 4 (four) hours as needed for severe pain. (Patient not taking: Reported on 02/18/2023)   oxyCODONE-acetaminophen (PERCOCET) 5-325 MG tablet Take 1 tablet by mouth every 4 (four) hours as needed for severe pain. (Patient not taking: Reported on 02/18/2023)   oxyCODONE-acetaminophen (PERCOCET) 5-325 MG tablet Take 1 tablet by mouth every 4 (four) hours as  needed for severe pain. (Patient not taking: Reported on 02/18/2023)   No facility-administered encounter medications on file as of 02/18/2023.    Review of Systems  Review of Systems  Constitutional: Negative.   HENT: Negative.    Cardiovascular: Negative.   Gastrointestinal: Negative.   Allergic/Immunologic: Negative.   Neurological: Negative.   Psychiatric/Behavioral: Negative.       Objective:   BP 125/89   Pulse 94   Temp (!) 96.4 F (35.8 C)   Wt 140 lb 9.6 oz (63.8 kg)   SpO2 98%   BMI 19.61 kg/m   Wt Readings from Last 5 Encounters:  02/18/23 140 lb 9.6 oz (63.8 kg)  02/06/23 151 lb 0.2 oz (68.5 kg)  01/19/23 151 lb 0.2 oz (68.5 kg)  01/14/23 139 lb 15.9 oz (63.5 kg)  01/11/23 139 lb 15.9 oz (63.5 kg)     Physical Exam Vitals and nursing note reviewed.  Constitutional:      General: He is not in acute distress.    Appearance: He is well-developed.  Cardiovascular:     Rate and Rhythm: Normal rate and regular rhythm.  Pulmonary:     Effort: Pulmonary effort is normal.     Breath sounds: Normal breath sounds.  Skin:    General: Skin is warm and dry.  Neurological:     Mental Status: He is alert and oriented to person, place, and time.       Assessment & Plan:   Erectile disorder -     Sildenafil Citrate; Take 0.5-1 tablets (50-100 mg total) by mouth daily as needed for erectile dysfunction.  Dispense: 5 tablet; Refill: 11  Lipid screening -     Lipid panel  Routine adult health maintenance -     CBC -     Comprehensive metabolic panel  Other orders -     Ibuprofen; Take 1 tablet (800 mg total) by mouth every 6 (six) hours as needed.  Dispense: 60 tablet; Refill: 1     Return in about 6 months (around 08/18/2023).   Ivonne Andrew, NP 02/18/2023

## 2023-02-19 LAB — COMPREHENSIVE METABOLIC PANEL
ALT: 26 [IU]/L (ref 0–44)
AST: 40 [IU]/L (ref 0–40)
Albumin: 4.3 g/dL (ref 3.9–4.9)
Alkaline Phosphatase: 81 [IU]/L (ref 44–121)
BUN/Creatinine Ratio: 9 — ABNORMAL LOW (ref 10–24)
BUN: 8 mg/dL (ref 8–27)
Bilirubin Total: 0.5 mg/dL (ref 0.0–1.2)
CO2: 20 mmol/L (ref 20–29)
Calcium: 9.4 mg/dL (ref 8.6–10.2)
Chloride: 105 mmol/L (ref 96–106)
Creatinine, Ser: 0.93 mg/dL (ref 0.76–1.27)
Globulin, Total: 2.5 g/dL (ref 1.5–4.5)
Glucose: 83 mg/dL (ref 70–99)
Potassium: 5.4 mmol/L — ABNORMAL HIGH (ref 3.5–5.2)
Sodium: 145 mmol/L — ABNORMAL HIGH (ref 134–144)
Total Protein: 6.8 g/dL (ref 6.0–8.5)
eGFR: 91 mL/min/{1.73_m2} (ref >59)

## 2023-02-19 LAB — LIPID PANEL
Chol/HDL Ratio: 2.2 {ratio} (ref 0.0–5.0)
Cholesterol, Total: 151 mg/dL (ref 100–199)
HDL: 70 mg/dL (ref >39)
LDL Chol Calc (NIH): 65 mg/dL (ref 0–99)
Triglycerides: 83 mg/dL (ref 0–149)
VLDL Cholesterol Cal: 16 mg/dL (ref 5–40)

## 2023-02-19 LAB — CBC

## 2023-02-20 ENCOUNTER — Ambulatory Visit: Payer: 59 | Attending: Nurse Practitioner | Admitting: Audiologist

## 2023-02-20 ENCOUNTER — Other Ambulatory Visit: Payer: Self-pay | Admitting: Nurse Practitioner

## 2023-02-20 DIAGNOSIS — E875 Hyperkalemia: Secondary | ICD-10-CM

## 2023-02-26 ENCOUNTER — Other Ambulatory Visit: Payer: Self-pay

## 2023-03-05 ENCOUNTER — Other Ambulatory Visit: Payer: Self-pay | Admitting: Nurse Practitioner

## 2023-03-05 DIAGNOSIS — G629 Polyneuropathy, unspecified: Secondary | ICD-10-CM

## 2023-03-05 DIAGNOSIS — G8929 Other chronic pain: Secondary | ICD-10-CM

## 2023-03-06 NOTE — Telephone Encounter (Signed)
Please advise Kh 

## 2023-03-10 ENCOUNTER — Other Ambulatory Visit: Payer: Self-pay | Admitting: Nurse Practitioner

## 2023-03-10 DIAGNOSIS — G4700 Insomnia, unspecified: Secondary | ICD-10-CM

## 2023-03-11 NOTE — Telephone Encounter (Signed)
Please advise KH 

## 2023-03-12 IMAGING — CT CT HIP*L* W/O CM
2 of 4 series · 16 of 46 positions shown, 18 images · non-contrast
Comparison: Radiographs, same day.

CLINICAL DATA: Fell.

EXAM:
CT OF THE LEFT HIP WITHOUT CONTRAST
TECHNIQUE: Multidetector CT imaging of the left hip was performed according to
the standard protocol. Multiplanar CT image reconstructions were
also generated.

[Series 5: hip 2.0 ax st · axial · 0.38mm/px · z∈[-993,-769]mm · 13 of 130 slices shown, 15 images]
[im 9/130  soft-tissue]
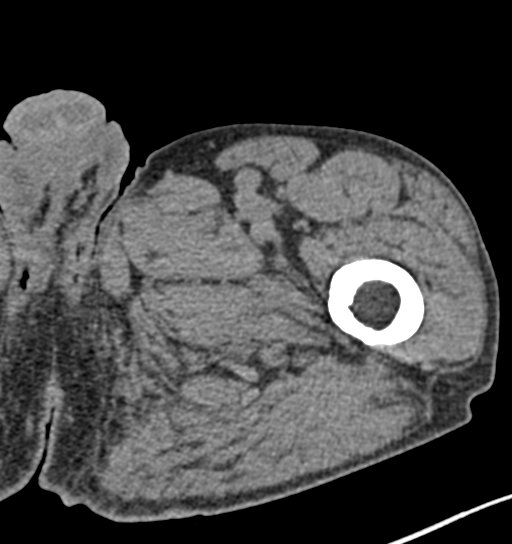
[im 9/130  bone]
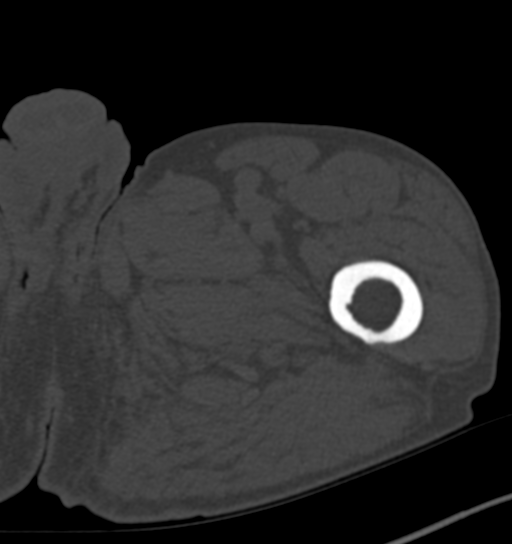
[im 18/130  soft-tissue]
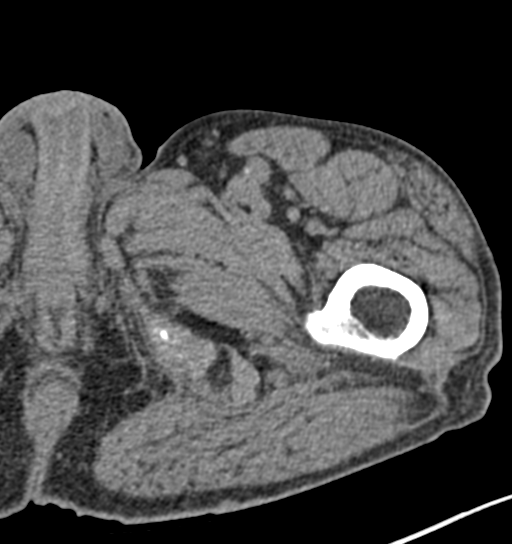
[im 26/130  soft-tissue]
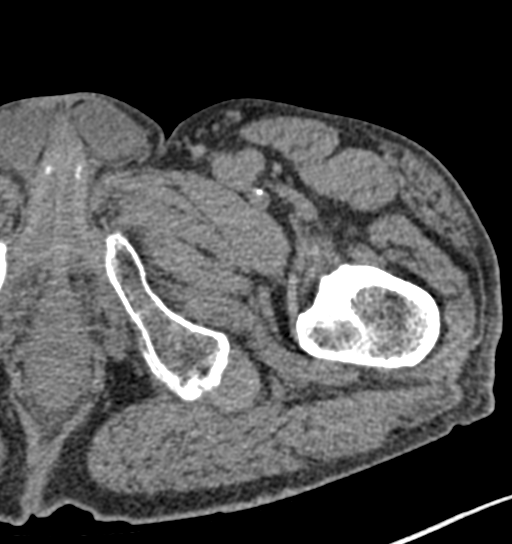
[im 35/130  soft-tissue]
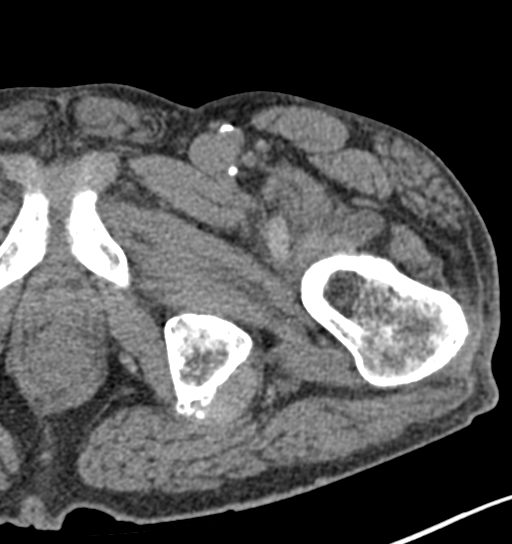
[im 44/130  soft-tissue]
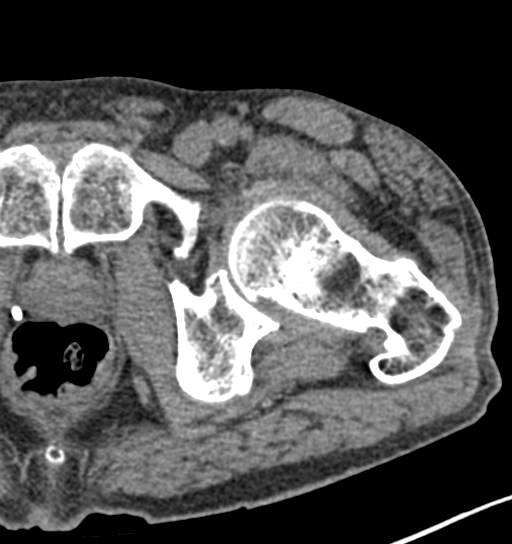
[im 52/130  soft-tissue]
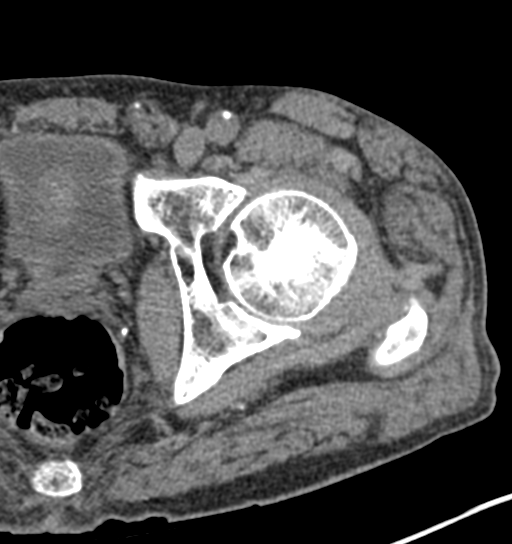
[im 69/130  soft-tissue]
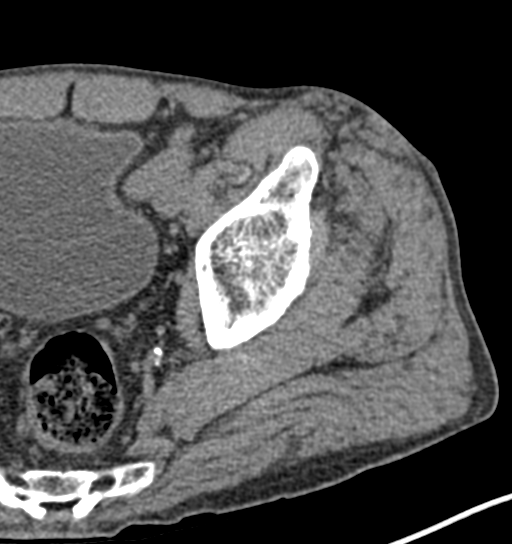
[im 78/130  soft-tissue]
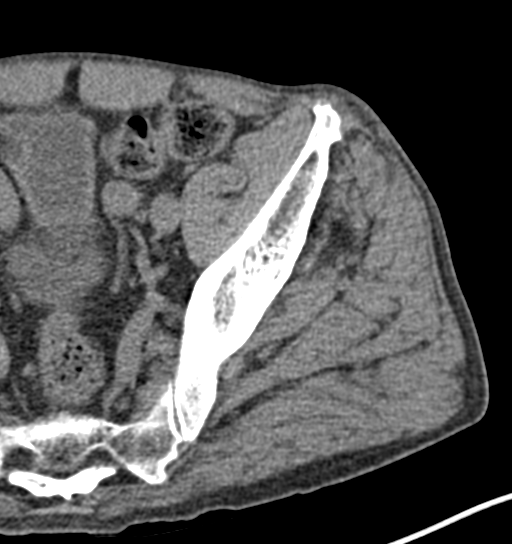
[im 87/130  soft-tissue]
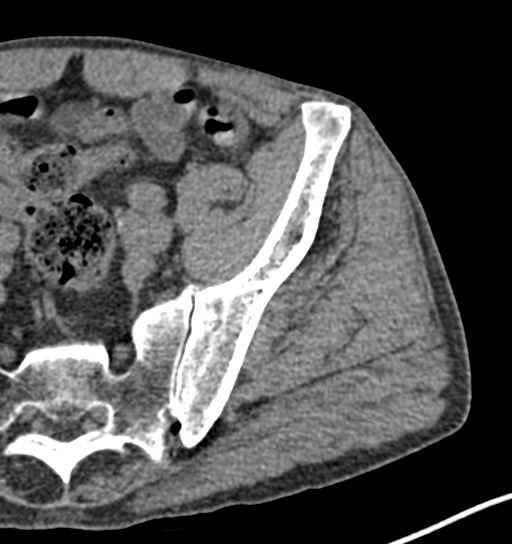
[im 87/130  bone]
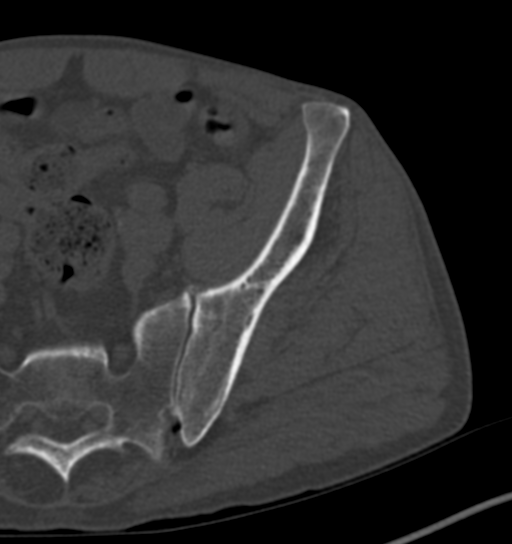
[im 95/130  soft-tissue]
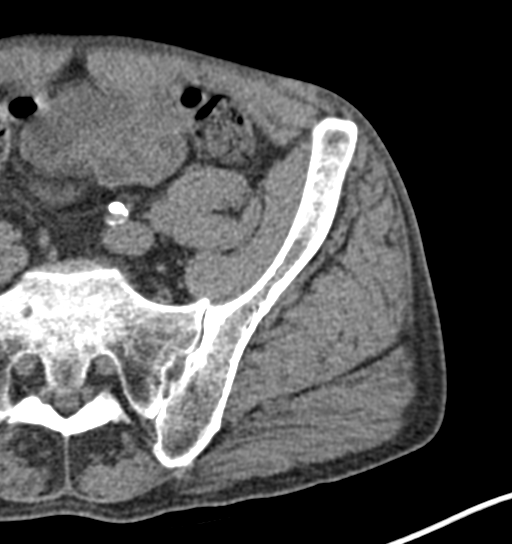
[im 104/130  soft-tissue]
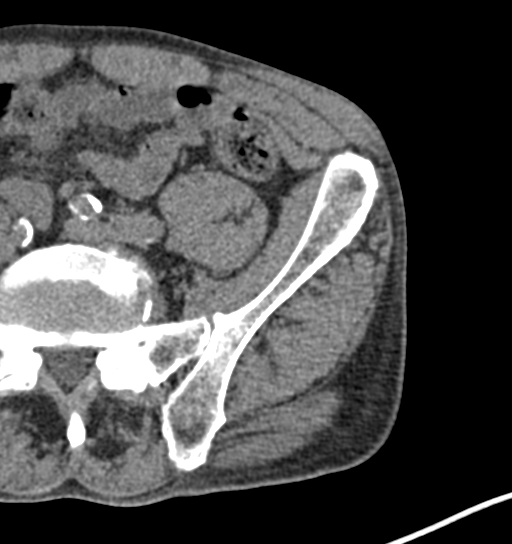
[im 112/130  soft-tissue]
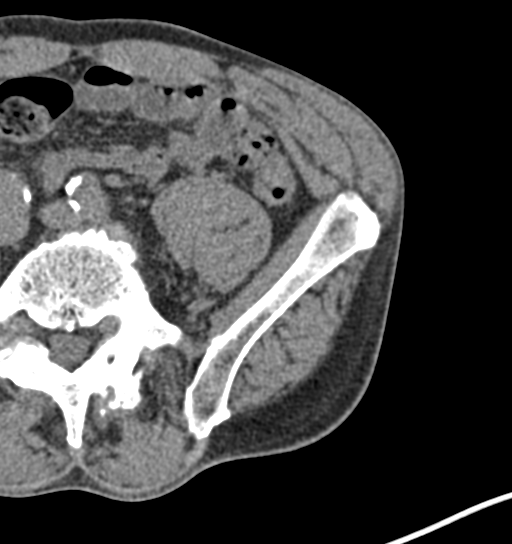
[im 121/130  soft-tissue]
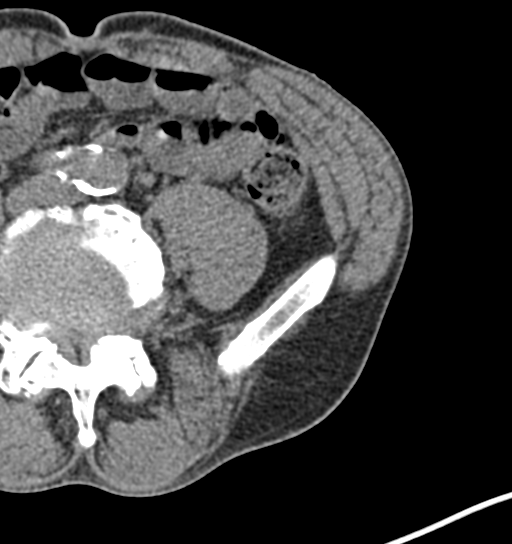

[Series 8: hip 2.0 cor. st · coronal · 0.37mm/px · 3 of 66 slices shown]
[im 22/66  soft-tissue]
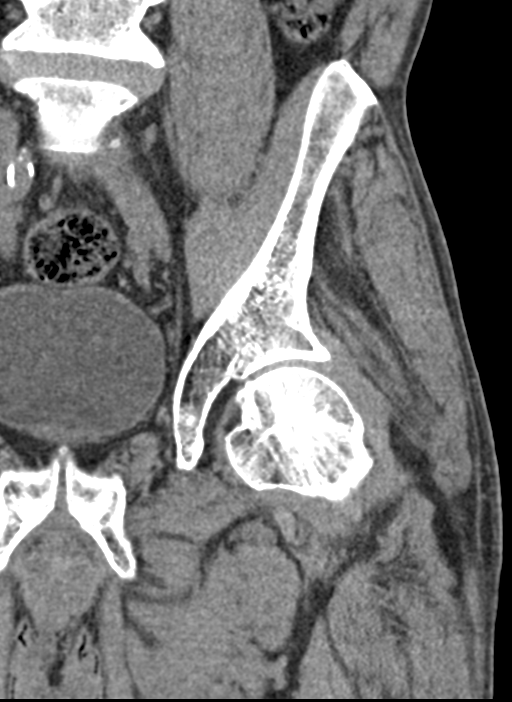
[im 29/66  soft-tissue]
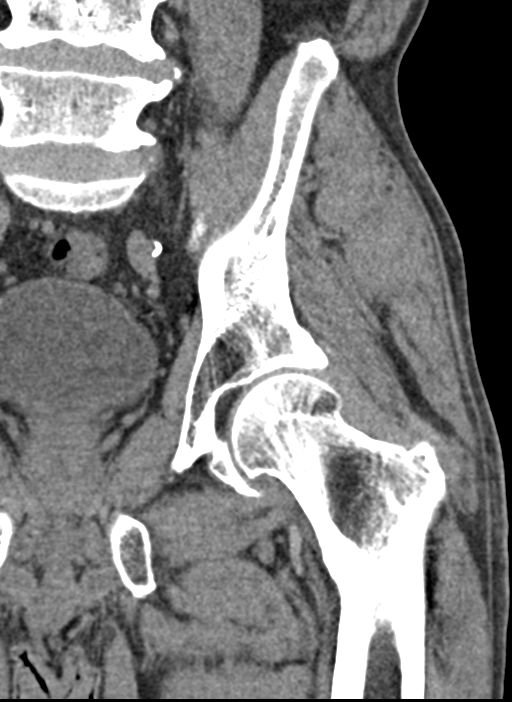
[im 37/66  soft-tissue]
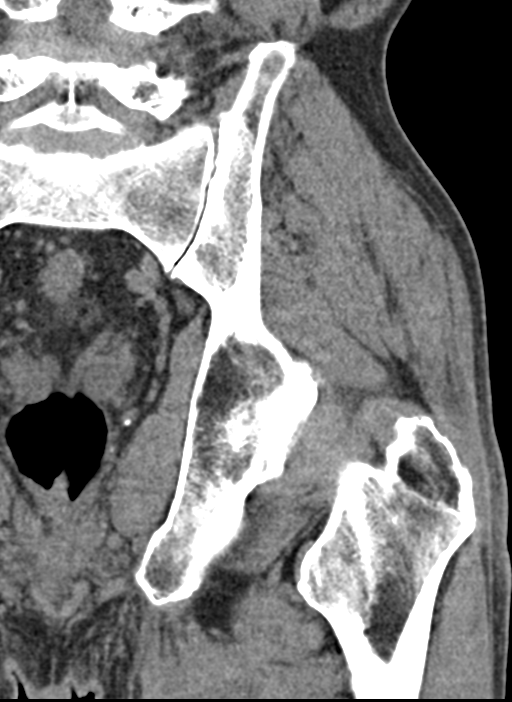

[16 of 46 positions shown; findings below may reference images not displayed]

FINDINGS: Both hips are normally located. No hip fracture or AVN. The pubic
symphysis and SI joints are intact. No pelvic fractures or bone
lesions.

The hip and pelvic musculature are grossly normal. No obvious muscle
tear or intramuscular hematoma.

No significant intrapelvic abnormalities. No inguinal mass or
hernia.
IMPRESSION: Unremarkable CT examination of the left hip. No hip or pelvic
fracture.

## 2023-08-16 ENCOUNTER — Ambulatory Visit (INDEPENDENT_AMBULATORY_CARE_PROVIDER_SITE_OTHER): Admitting: Podiatry

## 2023-08-16 DIAGNOSIS — M65972 Unspecified synovitis and tenosynovitis, left ankle and foot: Secondary | ICD-10-CM | POA: Diagnosis not present

## 2023-08-16 MED ORDER — METHYLPREDNISOLONE 4 MG PO TBPK: ORAL_TABLET | ORAL | 0 refills | Status: DC

## 2023-08-16 NOTE — Progress Notes (Signed)
 Subjective:  Patient ID: Leonard Ramos, male    DOB: 12-16-1955,  MRN: [id number]  Chief Complaint  Patient presents with   Arthritis    68 y.o. male presents with the above complaint.  Patient presents with joint contracture of left second toe.  Patient states having painful to touch has progressive gotten worse worse with ambulation worse with pressure patient would like to discuss treatment options for this.  Pain scale 7 out of 10 dull aching nature he would like to discuss treatment options for this   Review of Systems: Negative except as noted in the HPI. Denies N/V/F/Ch.  Past Medical History:  Diagnosis Date   Alcohol use    drinks 2 40's per day   Anxiety    Aortic atherosclerosis (HCC)    Arthritis    Cervical spondylosis with myelopathy and radiculopathy    Chronic low back pain    Cocaine abuse (HCC)    last used 01-09-23   Depression    ED (erectile dysfunction)    Full dentures    GERD (gastroesophageal reflux disease)    Hammertoe of left foot    Hyperlipidemia    Liver disease    Lumbosacral spondylolysis    Marijuana abuse    uses marijuana everyday   Neuropathy    hands tingling and feet numbness   Periodontitis, chronic, generalized    Poor dentition    Substance abuse (HCC)    Tobacco use    Vitamin D deficiency 03/2019    Current Outpatient Medications:    methylPREDNISolone (MEDROL DOSEPAK) 4 MG TBPK tablet, Take as directed, Disp: 21 each, Rfl: 0   doxycycline (VIBRA-TABS) 100 MG tablet, Take 1 tablet (100 mg total) by mouth 2 (two) times daily. (Patient not taking: Reported on 02/18/2023), Disp: 20 tablet, Rfl: 0   folic acid (FOLVITE) 1 MG tablet, Take 1 tablet (1 mg total) by mouth daily., Disp: 90 tablet, Rfl: 3   gabapentin (NEURONTIN) 600 MG tablet, TAKE 1 TABLET BY MOUTH 3 TIMES  DAILY, Disp: 300 tablet, Rfl: 2   HYDROcodone-acetaminophen (NORCO/VICODIN) 5-325 MG tablet, Take 1-2 tablets by mouth every 4 (four) hours as needed for  moderate pain or severe pain ((score 4 to 6)). (Patient not taking: Reported on 02/18/2023), Disp: 30 tablet, Rfl: 0   ibuprofen (ADVIL) 800 MG tablet, Take 1 tablet (800 mg total) by mouth every 6 (six) hours as needed., Disp: 60 tablet, Rfl: 1   omeprazole (PRILOSEC) 20 MG capsule, Take 1 capsule (20 mg total) by mouth daily. (Patient taking differently: Take 20 mg by mouth every morning.), Disp: 30 capsule, Rfl: 0   oxyCODONE-acetaminophen (PERCOCET) 5-325 MG tablet, Take 1 tablet by mouth every 4 (four) hours as needed for severe pain. (Patient not taking: Reported on 02/18/2023), Disp: 30 tablet, Rfl: 0   oxyCODONE-acetaminophen (PERCOCET) 5-325 MG tablet, Take 1 tablet by mouth every 4 (four) hours as needed for severe pain. (Patient not taking: Reported on 02/18/2023), Disp: 30 tablet, Rfl: 0   oxyCODONE-acetaminophen (PERCOCET) 5-325 MG tablet, Take 1 tablet by mouth every 4 (four) hours as needed for severe pain. (Patient not taking: Reported on 02/18/2023), Disp: 30 tablet, Rfl: 0   pravastatin (PRAVACHOL) 20 MG tablet, TAKE 1 TABLET BY MOUTH DAILY, Disp: 100 tablet, Rfl: 2   sildenafil (VIAGRA) 100 MG tablet, Take 0.5-1 tablets (50-100 mg total) by mouth daily as needed for erectile dysfunction., Disp: 5 tablet, Rfl: 11   traZODone (DESYREL) 50 MG tablet, TAKE  1/2 TO 1 TABLET BY MOUTH AT BEDTIME AS NEEDED FOR SLEEP, Disp: 30 tablet, Rfl: 11  Social History   Tobacco Use  Smoking Status Every Day   Current packs/day: 1.00   Average packs/day: 1 pack/day for 40.0 years (40.0 ttl pk-yrs)   Types: Cigarettes  Smokeless Tobacco Never    Allergies  Allergen Reactions   Trazodone And Nefazodone     nightmares   Objective:  There were no vitals filed for this visit. There is no height or weight on file to calculate BMI. Constitutional Well developed. Well nourished.  Vascular Dorsalis pedis pulses palpable bilaterally. Posterior tibial pulses palpable bilaterally. Capillary refill  normal to all digits.  No cyanosis or clubbing noted. Pedal hair growth normal.  Neurologic Normal speech. Oriented to person, place, and time. Epicritic sensation to light touch grossly present bilaterally.  Dermatologic Nails well groomed and normal in appearance. No open wounds. No skin lesions.  Orthopedic: Pain on palpation of left second metatarsophalangeal joint pain with range of motion of the joint jump scar tissue noted.  No open wounds or lesion noted   Radiographs: None Assessment:  No diagnosis found. Plan:  Patient was evaluated and treated and all questions answered.  Left second metatarsophalangeal joint capsulitis -All questions and concerns were discussed with the patient extensive detail -Given the amount of pain that she is having she will benefit from he will benefit from steroid injection to help decrease any rupture component associate with pain.  He agrees with provide to proceed with steroid injection -A steroid injection was performed at left second metatarsophalangeal joint using 1% plain Lidocaine and 10 mg of Kenalog. This was well tolerated.   No follow-ups on file.

## 2023-08-19 ENCOUNTER — Encounter: Payer: Self-pay | Admitting: Nurse Practitioner

## 2023-08-19 ENCOUNTER — Ambulatory Visit (INDEPENDENT_AMBULATORY_CARE_PROVIDER_SITE_OTHER): Payer: Self-pay | Admitting: Nurse Practitioner

## 2023-08-19 ENCOUNTER — Other Ambulatory Visit: Payer: Self-pay | Admitting: Nurse Practitioner

## 2023-08-19 VITALS — BP 127/87 | HR 91 | Temp 98.2°F | Wt 140.6 lb

## 2023-08-19 DIAGNOSIS — Z0000 Encounter for general adult medical examination without abnormal findings: Secondary | ICD-10-CM

## 2023-08-19 DIAGNOSIS — H9193 Unspecified hearing loss, bilateral: Secondary | ICD-10-CM

## 2023-08-19 NOTE — Patient Instructions (Addendum)
 1. Routine adult health maintenance (Primary)  - CBC - Comprehensive metabolic panel with GFR  2. Hearing difficulty of both ears

## 2023-08-19 NOTE — Progress Notes (Signed)
 Subjective   Patient ID: Leonard Ramos, male    DOB: [birth year], 68 y.o.   MRN: [id number]  Chief Complaint  Patient presents with   Follow-up    Referring provider: Ivonne Andrew, NP  [name] is a 68 y.o. male with Past Medical History: No date: Alcohol use     Comment:  drinks 2 40's per day No date: Anxiety No date: Aortic atherosclerosis (HCC) No date: Arthritis No date: Cervical spondylosis with myelopathy and radiculopathy No date: Chronic low back pain No date: Cocaine abuse (HCC)     Comment:  last used 01-09-23 No date: Depression No date: ED (erectile dysfunction) No date: Full dentures No date: GERD (gastroesophageal reflux disease) No date: Hammertoe of left foot No date: Hyperlipidemia No date: Liver disease No date: Lumbosacral spondylolysis No date: Marijuana abuse     Comment:  uses marijuana everyday No date: Neuropathy     Comment:  hands tingling and feet numbness No date: Periodontitis, chronic, generalized No date: Poor dentition No date: Substance abuse (HCC) No date: Tobacco use 03/2019: Vitamin D deficiency   HPI  Patient presents today for routine follow-up. Having difficulty hearing. Noted to have bilateral cerumen impaction. Does need ear wash bilateral.  He states that overall he has been doing well other than that.  He will need labs today.  He states that he does not need refills today.  Denies f/c/s, n/v/d, hemoptysis, PND, leg swelling. Denies chest pain or edema.  Note: patient states that he is still having hearing difficulty after ear wash. Will refer to ENT.   Allergies  Allergen Reactions   Trazodone And Nefazodone     nightmares    Immunization History  Administered Date(s) Administered   Influenza, High Dose Seasonal PF 02/04/2023   Influenza,inj,Quad PF,6+ Mos 03/08/2015, 03/08/2016, 03/06/2017, 03/07/2018, 04/10/2019   Moderna Sars-Covid-2 Vaccination 09/28/2019   Pneumococcal Conjugate-13 08/16/2022    Pneumococcal Polysaccharide-23 03/08/2015   Tdap 03/08/2016    Tobacco History: Social History   Tobacco Use  Smoking Status Every Day   Current packs/day: 1.00   Average packs/day: 1 pack/day for 40.0 years (40.0 ttl pk-yrs)   Types: Cigarettes  Smokeless Tobacco Never   Ready to quit: No Counseling given: Yes   Outpatient Encounter Medications as of 08/19/2023  Medication Sig   folic acid (FOLVITE) 1 MG tablet Take 1 tablet (1 mg total) by mouth daily.   gabapentin (NEURONTIN) 600 MG tablet TAKE 1 TABLET BY MOUTH 3 TIMES  DAILY   HYDROcodone-acetaminophen (NORCO/VICODIN) 5-325 MG tablet Take 1-2 tablets by mouth every 4 (four) hours as needed for moderate pain or severe pain ((score 4 to 6)).   ibuprofen (ADVIL) 800 MG tablet Take 1 tablet (800 mg total) by mouth every 6 (six) hours as needed.   omeprazole (PRILOSEC) 20 MG capsule Take 1 capsule (20 mg total) by mouth daily. (Patient taking differently: Take 20 mg by mouth every morning.)   pravastatin (PRAVACHOL) 20 MG tablet TAKE 1 TABLET BY MOUTH DAILY   traZODone (DESYREL) 50 MG tablet TAKE 1/2 TO 1 TABLET BY MOUTH AT BEDTIME AS NEEDED FOR SLEEP   doxycycline (VIBRA-TABS) 100 MG tablet Take 1 tablet (100 mg total) by mouth 2 (two) times daily. (Patient not taking: Reported on 08/19/2023)   methylPREDNISolone (MEDROL DOSEPAK) 4 MG TBPK tablet Take as directed (Patient not taking: Reported on 08/19/2023)   oxyCODONE-acetaminophen (PERCOCET) 5-325 MG tablet Take 1 tablet by mouth every 4 (four) hours  as needed for severe pain. (Patient not taking: Reported on 08/19/2023)   oxyCODONE-acetaminophen (PERCOCET) 5-325 MG tablet Take 1 tablet by mouth every 4 (four) hours as needed for severe pain. (Patient not taking: Reported on 08/19/2023)   oxyCODONE-acetaminophen (PERCOCET) 5-325 MG tablet Take 1 tablet by mouth every 4 (four) hours as needed for severe pain. (Patient not taking: Reported on 08/19/2023)   sildenafil (VIAGRA) 100 MG  tablet Take 0.5-1 tablets (50-100 mg total) by mouth daily as needed for erectile dysfunction. (Patient not taking: Reported on 08/19/2023)   No facility-administered encounter medications on file as of 08/19/2023.    Review of Systems  Review of Systems  Constitutional: Negative.   HENT:  Positive for hearing loss.   Cardiovascular: Negative.   Gastrointestinal: Negative.   Allergic/Immunologic: Negative.   Neurological: Negative.   Psychiatric/Behavioral: Negative.       Objective:   BP 127/87   Pulse 91   Temp 98.2 F (36.8 C) (Oral)   Wt 140 lb 9.6 oz (63.8 kg)   SpO2 100%   BMI 19.61 kg/m   Wt Readings from Last 5 Encounters:  08/19/23 140 lb 9.6 oz (63.8 kg)  02/18/23 140 lb 9.6 oz (63.8 kg)  02/06/23 151 lb 0.2 oz (68.5 kg)  01/19/23 151 lb 0.2 oz (68.5 kg)  01/14/23 139 lb 15.9 oz (63.5 kg)     Physical Exam Vitals and nursing note reviewed.  Constitutional:      General: He is not in acute distress.    Appearance: He is well-developed.  HENT:     Right Ear: There is impacted cerumen.     Left Ear: There is impacted cerumen.  Cardiovascular:     Rate and Rhythm: Normal rate and regular rhythm.  Pulmonary:     Effort: Pulmonary effort is normal.     Breath sounds: Normal breath sounds.  Skin:    General: Skin is warm and dry.  Neurological:     Mental Status: He is alert and oriented to person, place, and time.       Assessment & Plan:   Routine adult health maintenance -     CBC -     Comprehensive metabolic panel with GFR  Hearing difficulty of both ears   Ear wash performed in office today  Return in about 6 months (around 02/18/2024).   Ivonne Andrew, NP 08/19/2023

## 2023-08-20 LAB — COMPREHENSIVE METABOLIC PANEL WITH GFR
ALT: 24 IU/L (ref 0–44)
AST: 32 IU/L (ref 0–40)
Albumin: 4.7 g/dL (ref 3.9–4.9)
Alkaline Phosphatase: 74 IU/L (ref 44–121)
BUN/Creatinine Ratio: 11 (ref 10–24)
BUN: 9 mg/dL (ref 8–27)
Bilirubin Total: 0.3 mg/dL (ref 0.0–1.2)
CO2: 23 mmol/L (ref 20–29)
Calcium: 9.2 mg/dL (ref 8.6–10.2)
Chloride: 106 mmol/L (ref 96–106)
Creatinine, Ser: 0.84 mg/dL (ref 0.76–1.27)
Globulin, Total: 2.4 g/dL (ref 1.5–4.5)
Glucose: 81 mg/dL (ref 70–99)
Potassium: 4.6 mmol/L (ref 3.5–5.2)
Sodium: 142 mmol/L (ref 134–144)
Total Protein: 7.1 g/dL (ref 6.0–8.5)
eGFR: 96 mL/min/{1.73_m2} (ref >59)

## 2023-08-20 LAB — CBC
Hematocrit: 48.2 % (ref 37.5–51.0)
Hemoglobin: 15.8 g/dL (ref 13.0–17.7)
MCH: 30.7 pg (ref 26.6–33.0)
MCHC: 32.8 g/dL (ref 31.5–35.7)
MCV: 94 fL (ref 79–97)
Platelets: 249 10*3/uL (ref 150–450)
RBC: 5.14 x10E6/uL (ref 4.14–5.80)
RDW: 14.2 % (ref 11.6–15.4)
WBC: 5.8 10*3/uL (ref 3.4–10.8)

## 2023-09-23 ENCOUNTER — Encounter (HOSPITAL_COMMUNITY): Payer: Self-pay | Admitting: Emergency Medicine

## 2023-09-23 ENCOUNTER — Ambulatory Visit (INDEPENDENT_AMBULATORY_CARE_PROVIDER_SITE_OTHER)

## 2023-09-23 ENCOUNTER — Other Ambulatory Visit: Payer: Self-pay

## 2023-09-23 ENCOUNTER — Ambulatory Visit (HOSPITAL_COMMUNITY): Admission: EM | Admit: 2023-09-23 | Discharge: 2023-09-23 | Disposition: A | Discharge status: Home / Self Care

## 2023-09-23 DIAGNOSIS — M25551 Pain in right hip: Secondary | ICD-10-CM

## 2023-09-23 MED ORDER — PREDNISONE 20 MG PO TABS: 40.0000 mg | ORAL_TABLET | Freq: Every day | ORAL | 0 refills | Status: AC

## 2023-09-23 MED ORDER — KETOROLAC TROMETHAMINE 30 MG/ML IJ SOLN
30.0000 mg | Freq: Once | INTRAMUSCULAR | Status: AC
  Administered 2023-09-23: 30 mg via INTRAMUSCULAR

## 2023-09-23 MED ORDER — KETOROLAC TROMETHAMINE 30 MG/ML IJ SOLN
INTRAMUSCULAR | Status: AC
  Filled 2023-09-23: qty 1

## 2023-09-23 NOTE — ED Provider Notes (Addendum)
 [facility]  Note:  This document was prepared using Dragon voice recognition software and may include unintentional dictation errors.  MRN: [id number] DOB: 11/28/1955  Subjective:   Leonard Ramos is a 68 y.o. male presenting for right hip pain x 1 week.  Patient denies any known injury or trauma to the right hip.  No history of chronic hip pain.  Patient has been taking ibuprofen  and Tylenol  with no relief.  Patient reports increased pain with weightbearing activities but also has consistent pain with nonweightbearing.  Patient denies any right-sided weakness or neurologic symptoms.  Patient reports that there is slight radiation of pain down his right leg.  Based on presentation symptoms are mostly osteoarthritic in nature, probable acute on chronic right hip pain from osteoarthritis.   Current Facility-Administered Medications:    ketorolac  (TORADOL ) 30 MG/ML injection 30 mg, 30 mg, Intramuscular, Once, Alson Mcpheeters B, NP  Current Outpatient Medications:    predniSONE  (DELTASONE ) 20 MG tablet, Take 2 tablets (40 mg total) by mouth daily for 5 days., Disp: 10 tablet, Rfl: 0   doxycycline  (VIBRA -TABS) 100 MG tablet, Take 1 tablet (100 mg total) by mouth 2 (two) times daily. (Patient not taking: Reported on 08/19/2023), Disp: 20 tablet, Rfl: 0   folic acid  (FOLVITE ) 1 MG tablet, Take 1 tablet (1 mg total) by mouth daily., Disp: 90 tablet, Rfl: 3   gabapentin  (NEURONTIN ) 600 MG tablet, TAKE 1 TABLET BY MOUTH 3 TIMES  DAILY, Disp: 300 tablet, Rfl: 2   HYDROcodone -acetaminophen  (NORCO/VICODIN) 5-325 MG tablet, Take 1-2 tablets by mouth every 4 (four) hours as needed for moderate pain or severe pain ((score 4 to 6))., Disp: 30 tablet, Rfl: 0   ibuprofen  (ADVIL ) 800 MG tablet, Take 1 tablet (800 mg total) by mouth every 6 (six) hours as needed., Disp: 60 tablet, Rfl: 1   omeprazole  (PRILOSEC) 20 MG capsule, Take 1 capsule (20 mg total) by mouth daily. (Patient taking  differently: Take 20 mg by mouth every morning.), Disp: 30 capsule, Rfl: 0   oxyCODONE -acetaminophen  (PERCOCET) 5-325 MG tablet, Take 1 tablet by mouth every 4 (four) hours as needed for severe pain. (Patient not taking: Reported on 08/19/2023), Disp: 30 tablet, Rfl: 0   oxyCODONE -acetaminophen  (PERCOCET) 5-325 MG tablet, Take 1 tablet by mouth every 4 (four) hours as needed for severe pain. (Patient not taking: Reported on 08/19/2023), Disp: 30 tablet, Rfl: 0   oxyCODONE -acetaminophen  (PERCOCET) 5-325 MG tablet, Take 1 tablet by mouth every 4 (four) hours as needed for severe pain. (Patient not taking: Reported on 08/19/2023), Disp: 30 tablet, Rfl: 0   pravastatin  (PRAVACHOL ) 20 MG tablet, TAKE 1 TABLET BY MOUTH DAILY, Disp: 100 tablet, Rfl: 2   sildenafil  (VIAGRA ) 100 MG tablet, Take 0.5-1 tablets (50-100 mg total) by mouth daily as needed for erectile dysfunction. (Patient not taking: Reported on 08/19/2023), Disp: 5 tablet, Rfl: 11   traZODone  (DESYREL ) 50 MG tablet, TAKE 1/2 TO 1 TABLET BY MOUTH AT BEDTIME AS NEEDED FOR SLEEP, Disp: 30 tablet, Rfl: 11   Allergies  Allergen Reactions   Trazodone  And Nefazodone     nightmares    Past Medical History:  Diagnosis Date   Alcohol  use    drinks 2 40's per day   Anxiety    Aortic atherosclerosis (HCC)    Arthritis    Cervical spondylosis with myelopathy and radiculopathy    Chronic low back pain    Cocaine abuse (HCC)    last used 01-09-23  Depression    ED (erectile dysfunction)    Full dentures    GERD (gastroesophageal reflux disease)    Hammertoe of left foot    Hyperlipidemia    Liver disease    Lumbosacral spondylolysis    Marijuana abuse    uses marijuana everyday   Neuropathy    hands tingling and feet numbness   Periodontitis, chronic, generalized    Poor dentition    Substance abuse (HCC)    Tobacco use    Vitamin D  deficiency 03/2019     Past Surgical History:  Procedure Laterality Date   ANTERIOR CERVICAL  DECOMP/DISCECTOMY FUSION N/A 02/28/2021   Procedure: Anterior Cervical Discectomy Fusion - Cervical three-Cervical four - Cervical four-Cervical five - Cervical five-Cervical six;  Surgeon: Agustina Aldrich, MD;  Location: [facility] OR;  Service: Neurosurgery;  Laterality: N/A;   CAPSULOTOMY Left 01/14/2023   Procedure: CAPSULOTOMY MPJ RELEASE;  Surgeon: Velma Ghazi, DPM;  Location: [facility] ORS;  Service: Orthopedics/Podiatry;  Laterality: Left;   HALLUX VALGUS LAPIDUS Left 01/14/2023   Procedure: HALLUX MPJ FUSION;  Surgeon: Velma Ghazi, DPM;  Location: [facility] ORS;  Service: Orthopedics/Podiatry;  Laterality: Left;   HAMMER TOE SURGERY Left 01/14/2023   Procedure: HAMMER TOE CORRECTION;  Surgeon: Velma Ghazi, DPM;  Location: [facility] ORS;  Service: Orthopedics/Podiatry;  Laterality: Left;   LUMBAR LAMINECTOMY/DECOMPRESSION MICRODISCECTOMY N/A 04/09/2022   Procedure: Lumbar Two-Three, Lumbar Three-Four, Lumbar Four-Five Laminectomy and Foraminotomy;  Surgeon: Agustina Aldrich, MD;  Location: [facility] OR;  Service: Neurosurgery;  Laterality: N/A;   LUMBAR WOUND DEBRIDEMENT N/A 04/09/2022   Procedure: Reexploration of Lumbar wound to remove hematoma;  Surgeon: Agustina Aldrich, MD;  Location: [facility] OR;  Service: Neurosurgery;  Laterality: N/A;   MULTIPLE EXTRACTIONS WITH ALVEOLOPLASTY Bilateral 09/04/2019   Procedure: MULTIPLE EXTRACTION W/ ALVEOLOPLASTY, Removal of left maxillary buccal exostosis;  Surgeon: Ascencion Lava, DDS;  Location: [facility] OR;  Service: Oral Surgery;  Laterality: Bilateral;    Family History  Problem Relation Age of Onset   CAD Father     Social History   Tobacco Use   Smoking status: Every Day    Current packs/day: 1.00    Average packs/day: 1 pack/day for 40.0 years (40.0 ttl pk-yrs)    Types: Cigarettes   Smokeless tobacco: Never  Vaping Use   Vaping status: Never Used  Substance Use Topics   Alcohol  use: Yes    Alcohol /week: 40.0 standard drinks of alcohol     Types: 40 Standard drinks or  equivalent per week    Comment: States I drink beer all day long. Patient states he drinks 2 40's per day   Drug use: Yes    Types: Marijuana, Cocaine    Comment: Patient states "I smoke marijuana everyday."  Last use cocaine 01-09-23    ROS Refer to HPI for ROS details.  Objective:   Vitals: BP (!) 139/96 (BP Location: Right Arm)   Pulse 83   Temp 98 F (36.7 C) (Oral)   Resp 18   SpO2 94%   Physical Exam Vitals and nursing note reviewed.  Constitutional:      General: He is not in acute distress.    Appearance: Normal appearance. He is not ill-appearing or toxic-appearing.  HENT:     Head: Normocephalic.  Cardiovascular:     Rate and Rhythm: Normal rate.  Pulmonary:     Effort: Pulmonary effort is normal. No respiratory distress.  Musculoskeletal:     Lumbar back: Tenderness present. No swelling, signs of  trauma, spasms or bony tenderness. Normal range of motion.     Right hip: Tenderness and bony tenderness present. No deformity. Normal range of motion. Normal strength.  Skin:    General: Skin is warm and dry.     Capillary Refill: Capillary refill takes less than 2 seconds.  Neurological:     General: No focal deficit present.     Mental Status: He is alert and oriented to person, place, and time.  Psychiatric:        Mood and Affect: Mood normal.        Behavior: Behavior normal.     Procedures  No results found for this or any previous visit (from the past 24 hours).  No results found.   Assessment and Plan :     Discharge Instructions       1. Acute right hip pain (Primary) - DG Hip Unilat W or Wo Pelvis 2-3 Views Right x-ray performed in UC shows no acute fracture however there is degenerative changes to the greater trochanter secondary to hip abductor weakness.  Diagnosis is confirmed with Dr. Francina Irish, advise follow-up with orthopedics for possible injection. - ketorolac  (TORADOL ) 30 MG/ML injection 30 mg - predniSONE  (DELTASONE ) 20 MG tablet; Take 2  tablets (40 mg total) by mouth daily for 5 days.  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 -Continue to monitor symptoms for any change in severity if there is any escalation of current symptoms or development of new symptoms follow-up in ER for further evaluation and management.      Yandel Zeiner B Maycee Blasco   Ibrahem Volkman B, NP 09/23/23 1155    Consetta Cosner B, NP 09/23/23 1157

## 2023-09-23 NOTE — Discharge Instructions (Addendum)
  1. Acute right hip pain (Primary) - DG Hip Unilat W or Wo Pelvis 2-3 Views Right x-ray performed in UC shows no acute fracture however there is degenerative changes to the greater trochanter secondary to hip abductor weakness.  Diagnosis is confirmed with Dr. Francina Irish, advise follow-up with orthopedics for possible injection. - ketorolac  (TORADOL ) 30 MG/ML injection 30 mg - predniSONE  (DELTASONE ) 20 MG tablet; Take 2 tablets (40 mg total) by mouth daily for 5 days.  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 -Continue to monitor symptoms for any change in severity if there is any escalation of current symptoms or development of new symptoms follow-up in ER for further evaluation and management.

## 2023-09-23 NOTE — ED Triage Notes (Signed)
 Right hip pain for a week.  Pain has increased over the past week.  Denies any injury prior to pain.  Denies any history of this area hurting in the past.  Area hurts with weight bearing activities and non-weight bearing.    Has taken tylenol  and ibuprofen .  No relief

## 2023-10-17 ENCOUNTER — Other Ambulatory Visit: Payer: Self-pay | Admitting: Nurse Practitioner

## 2023-10-17 DIAGNOSIS — Z760 Encounter for issue of repeat prescription: Secondary | ICD-10-CM

## 2023-10-17 DIAGNOSIS — I700 Atherosclerosis of aorta: Secondary | ICD-10-CM

## 2023-11-20 ENCOUNTER — Other Ambulatory Visit: Payer: Self-pay | Admitting: Nurse Practitioner

## 2023-11-20 DIAGNOSIS — G8929 Other chronic pain: Secondary | ICD-10-CM

## 2023-11-20 DIAGNOSIS — G629 Polyneuropathy, unspecified: Secondary | ICD-10-CM

## 2023-11-20 DIAGNOSIS — M545 Low back pain, unspecified: Secondary | ICD-10-CM

## 2023-12-06 ENCOUNTER — Other Ambulatory Visit: Payer: Self-pay | Admitting: Nurse Practitioner

## 2023-12-06 DIAGNOSIS — G4700 Insomnia, unspecified: Secondary | ICD-10-CM

## 2023-12-09 NOTE — Telephone Encounter (Signed)
 Please advise [facility]

## 2024-02-19 ENCOUNTER — Ambulatory Visit: Payer: Self-pay | Admitting: Nurse Practitioner

## 2024-02-19 ENCOUNTER — Other Ambulatory Visit: Payer: Self-pay

## 2024-03-05 ENCOUNTER — Telehealth: Payer: Self-pay

## 2024-03-05 NOTE — Telephone Encounter (Signed)
 [facility] advised pt mirtazapine  and trazodone  will interact with one another. Please advise [facility]

## 2024-03-06 NOTE — Telephone Encounter (Signed)
 Reviewed patient's medication list. Patient is prescribed mirtazapine  for depression by Dr. Maree and prescribed trazodone  PRN for insomnia by PCP, Bascom Borer, NP. While there is a small increased risk of serotonin syndrome when using these agents in combination, the benefit generally outweighs the risk. Patient should be advised to continue monitoring for increased sedation (especially in the AM), dizziness, and decreased alertness. Ok to communicate to pharmacy that provider has reviewed and approved co-administration of these agents.   Lorain Baseman, PharmD [facility] [phone]

## 2024-03-10 NOTE — Telephone Encounter (Signed)
Pharmacy advised [facility]

## 2024-04-15 ENCOUNTER — Ambulatory Visit (HOSPITAL_COMMUNITY)
Admission: EM | Admit: 2024-04-15 | Discharge: 2024-04-15 | Disposition: A | Discharge status: Home / Self Care | Attending: Emergency Medicine | Admitting: Emergency Medicine

## 2024-04-15 ENCOUNTER — Encounter (HOSPITAL_COMMUNITY): Payer: Self-pay

## 2024-04-15 DIAGNOSIS — M5441 Lumbago with sciatica, right side: Secondary | ICD-10-CM

## 2024-04-15 LAB — POCT URINE DIPSTICK
Bilirubin, UA: NEGATIVE
Blood, UA: NEGATIVE
Glucose, UA: NEGATIVE mg/dL
Ketones, POC UA: NEGATIVE mg/dL
Leukocytes, UA: NEGATIVE
Nitrite, UA: NEGATIVE
POC PROTEIN,UA: NEGATIVE
Spec Grav, UA: 1.025 (ref 1.010–1.025)
Urobilinogen, UA: 0.2 U/dL
pH, UA: 5.5 (ref 5.0–8.0)

## 2024-04-15 MED ORDER — KETOROLAC TROMETHAMINE 30 MG/ML IJ SOLN
INTRAMUSCULAR | Status: AC
Start: 2024-04-15 — End: 2024-04-15
  Filled 2024-04-15: qty 1

## 2024-04-15 MED ORDER — PREDNISONE 20 MG PO TABS: 40.0000 mg | ORAL_TABLET | Freq: Every day | ORAL | 0 refills | Status: AC

## 2024-04-15 MED ORDER — KETOROLAC TROMETHAMINE 30 MG/ML IJ SOLN
30.0000 mg | Freq: Once | INTRAMUSCULAR | Status: AC
  Administered 2024-04-15: 30 mg via INTRAMUSCULAR

## 2024-04-15 NOTE — ED Triage Notes (Signed)
 Patient presents to the office for right side flank pain radiating down to his right leg x3 weeks. Patient takes gabapentin  and hydrocodone  for pain but this has not helped.

## 2024-04-15 NOTE — ED Provider Notes (Signed)
 [facility]    CSN: [id number] Arrival date & time: 04/15/24  0849      History   Chief Complaint Chief Complaint  Patient presents with   Flank Pain   Leg Pain    HPI Leonard Ramos is a 68 y.o. male.   Patient presents to clinic over concern of right sided low back pain that radiates down his right leg for the past 3 weeks. This has been ongoing for months. He did have a slip in fall in a store a few weeks ago and this pain seemed to flare-up afterwards. He has been taking Excedrin, oxycodone , and gabapentin  for pain, this has not been helping.   Has not had inner leg numbness or incontinence.  Needs to ambulate with walker, his walker wheel is broken so he has not been using it.  The history is provided by the patient and medical records.  Flank Pain  Leg Pain   Past Medical History:  Diagnosis Date   Alcohol  use    drinks 2 40's per day   Anxiety    Aortic atherosclerosis    Arthritis    Cervical spondylosis with myelopathy and radiculopathy    Chronic low back pain    Cocaine abuse (HCC)    last used 01-09-23   Depression    ED (erectile dysfunction)    Full dentures    GERD (gastroesophageal reflux disease)    Hammertoe of left foot    Hyperlipidemia    Liver disease    Lumbosacral spondylolysis    Marijuana abuse    uses marijuana everyday   Neuropathy    hands tingling and feet numbness   Periodontitis, chronic, generalized    Poor dentition    Substance abuse (HCC)    Tobacco use    Vitamin D  deficiency 03/2019    Patient Active Problem List   Diagnosis Date Noted   Chronic low back pain 08/16/2022   Pain due to onychomycosis of toenails of both feet 05/02/2022   Hav (hallux abducto valgus), unspecified laterality 05/02/2022   Hammer toe of second toe of left foot 05/02/2022   Callus between toes 04/27/2022   Lumbar stenosis with neurogenic claudication 04/09/2022   Lumbosacral spondylolysis 01/26/2022   Cervical spondylosis  with myelopathy and radiculopathy 02/28/2021   Tobacco use 07/12/2019   History of hyperglycemia 07/12/2019   Erectile dysfunction 07/12/2019   Chronic left-sided low back pain without sciatica 04/10/2019   Alcohol  use 04/10/2019   Poor dentition 04/10/2019   Chronic dental pain 04/10/2019   Urinary frequency 04/10/2019   Aortic atherosclerosis 03/19/2017   Generalized anxiety disorder 10/07/2015   Depression 10/07/2015   Neuropathy 09/06/2015   Loss of weight 09/06/2015   Tobacco dependence 09/06/2015   Bowel incontinence 03/09/2015   ETOH abuse 03/09/2015   Left sided numbness 03/08/2015   Left-sided weakness 03/08/2015   Absence of bladder continence 03/08/2015    Past Surgical History:  Procedure Laterality Date   ANTERIOR CERVICAL DECOMP/DISCECTOMY FUSION N/A 02/28/2021   Procedure: Anterior Cervical Discectomy Fusion - Cervical three-Cervical four - Cervical four-Cervical five - Cervical five-Cervical six;  Surgeon: Louis Shove, MD;  Location: [facility] OR;  Service: Neurosurgery;  Laterality: N/A;   CAPSULOTOMY Left 01/14/2023   Procedure: CAPSULOTOMY MPJ RELEASE;  Surgeon: Tobie Franky SQUIBB, DPM;  Location: [facility] ORS;  Service: Orthopedics/Podiatry;  Laterality: Left;   HALLUX VALGUS LAPIDUS Left 01/14/2023   Procedure: HALLUX MPJ FUSION;  Surgeon: Tobie Franky SQUIBB, DPM;  Location:  [facility] ORS;  Service: Orthopedics/Podiatry;  Laterality: Left;   HAMMER TOE SURGERY Left 01/14/2023   Procedure: HAMMER TOE CORRECTION;  Surgeon: Tobie Franky SQUIBB, DPM;  Location: [facility] ORS;  Service: Orthopedics/Podiatry;  Laterality: Left;   LUMBAR LAMINECTOMY/DECOMPRESSION MICRODISCECTOMY N/A 04/09/2022   Procedure: Lumbar Two-Three, Lumbar Three-Four, Lumbar Four-Five Laminectomy and Foraminotomy;  Surgeon: Louis Shove, MD;  Location: [facility] OR;  Service: Neurosurgery;  Laterality: N/A;   LUMBAR WOUND DEBRIDEMENT N/A 04/09/2022   Procedure: Reexploration of Lumbar wound to remove hematoma;  Surgeon: Louis Shove, MD;   Location: [facility] OR;  Service: Neurosurgery;  Laterality: N/A;   MULTIPLE EXTRACTIONS WITH ALVEOLOPLASTY Bilateral 09/04/2019   Procedure: MULTIPLE EXTRACTION W/ ALVEOLOPLASTY, Removal of left maxillary buccal exostosis;  Surgeon: Sheryle Hamilton, DDS;  Location: [facility] OR;  Service: Oral Surgery;  Laterality: Bilateral;       Home Medications    Prior to Admission medications   Medication Sig Start Date End Date Taking? Authorizing Provider  gabapentin  (NEURONTIN ) 600 MG tablet TAKE 1 TABLET BY MOUTH 3 TIMES  DAILY 03/06/23  Yes Nichols, Tonya S, NP  HYDROcodone -acetaminophen  (NORCO/VICODIN) 5-325 MG tablet Take 1-2 tablets by mouth every 4 (four) hours as needed for moderate pain or severe pain ((score 4 to 6)). 04/10/22  Yes Bergman, Meghan D, NP  ibuprofen  (ADVIL ) 800 MG tablet Take 1 tablet (800 mg total) by mouth every 6 (six) hours as needed. 02/18/23  Yes Oley Bascom RAMAN, NP  mirtazapine  (REMERON ) 7.5 MG tablet Take 7.5 mg by mouth at bedtime. 11/27/23  Yes [provider]  omeprazole  (PRILOSEC) 20 MG capsule Take 1 capsule (20 mg total) by mouth daily. Patient taking differently: Take 20 mg by mouth every morning. 04/27/22  Yes Oley Bascom RAMAN, NP  pravastatin  (PRAVACHOL ) 20 MG tablet TAKE 1 TABLET BY MOUTH DAILY 10/18/23  Yes Nichols, Tonya S, NP  predniSONE  (DELTASONE ) 20 MG tablet Take 2 tablets (40 mg total) by mouth daily with breakfast for 5 days. 04/15/24 04/20/24 Yes Will Heinkel  N, FNP  traZODone  (DESYREL ) 50 MG tablet TAKE 1/2 TO 1 TABLET BY MOUTH AT BEDTIME AS NEEDED FOR SLEEP 12/09/23  Yes Oley Bascom RAMAN, NP  folic acid  (FOLVITE ) 1 MG tablet Take 1 tablet (1 mg total) by mouth daily. 04/18/20   Stroud, Natalie M, FNP  sildenafil  (VIAGRA ) 100 MG tablet Take 0.5-1 tablets (50-100 mg total) by mouth daily as needed for erectile dysfunction. Patient not taking: Reported on 08/19/2023 02/18/23   Oley Bascom RAMAN, NP    Family History Family History  Problem Relation Age of  Onset   CAD Father     Social History Social History   Tobacco Use   Smoking status: Every Day    Current packs/day: 1.00    Average packs/day: 1 pack/day for 40.0 years (40.0 ttl pk-yrs)    Types: Cigarettes   Smokeless tobacco: Never  Vaping Use   Vaping status: Never Used  Substance Use Topics   Alcohol  use: Yes    Alcohol /week: 40.0 standard drinks of alcohol     Types: 40 Standard drinks or equivalent per week    Comment: States I drink beer all day long. Patient states he drinks 2 40's per day   Drug use: Yes    Types: Marijuana, Cocaine    Comment: Patient states I smoke marijuana everyday.  Last use cocaine 01-09-23     Allergies   Trazodone  and nefazodone   Review of Systems Review of Systems  Per HPI  Physical  Exam Triage Vital Signs ED Triage Vitals [04/15/24 0914]  Encounter Vitals Group     BP 119/79     Girls Systolic BP Percentile      Girls Diastolic BP Percentile      Boys Systolic BP Percentile      Boys Diastolic BP Percentile      Pulse Rate (!) 102     Resp 20     Temp 97.8 F (36.6 C)     Temp Source Oral     SpO2 93 %     Weight      Height      Head Circumference      Peak Flow      Pain Score 9     Pain Loc      Pain Education      Exclude from Growth Chart    No data found.  Updated Vital Signs BP 119/79 (BP Location: Left Arm)   Pulse (!) 102   Temp 97.8 F (36.6 C) (Oral)   Resp 20   SpO2 93%   Visual Acuity Right Eye Distance:   Left Eye Distance:   Bilateral Distance:    Right Eye Near:   Left Eye Near:    Bilateral Near:     Physical Exam Vitals and nursing note reviewed.  Constitutional:      Appearance: Normal appearance.  HENT:     Head: Normocephalic and atraumatic.     Right Ear: External ear normal.     Left Ear: External ear normal.     Nose: Nose normal.     Mouth/Throat:     Mouth: Mucous membranes are moist.  Eyes:     Conjunctiva/sclera: Conjunctivae normal.  Cardiovascular:     Rate  and Rhythm: Normal rate.  Pulmonary:     Effort: Pulmonary effort is normal. No respiratory distress.  Musculoskeletal:        General: Normal range of motion.       Back:     Comments: Right sided low back pain, TTP, down into hip which is also TTP  Lumbar spine w/o step-off, deformity or acute bony abnormality   Skin:    General: Skin is warm and dry.  Neurological:     General: No focal deficit present.     Mental Status: He is alert and oriented to person, place, and time.  Psychiatric:        Mood and Affect: Mood normal.        Behavior: Behavior normal. Behavior is cooperative.      UC Treatments / Results  Labs (all labs ordered are listed, but only abnormal results are displayed) Labs Reviewed  POCT URINE DIPSTICK    EKG   Radiology No results found.  Procedures Procedures (including critical care time)  Medications Ordered in UC Medications  ketorolac  (TORADOL ) 30 MG/ML injection 30 mg (has no administration in time range)    Initial Impression / Assessment and Plan / UC Course  I have reviewed the triage vital signs and the nursing notes.  Pertinent labs & imaging results that were available during my care of the patient were reviewed by me and considered in my medical decision making (see chart for details).  Vitals and triage reviewed, patient is hemodynamically stable.  Having flareup of chronic right-sided low back pain.  Lumbar back into the right hip is tender to palpation.  Without red flag symptoms such as inner leg numbness or incontinence.  No recent injuries.  UA unremarkable, low concern for UTI.  Without urinary symptoms.  Imaging from previous visit in May did show some mild osteoarthritis of the right hip.  Had good relief with Toradol  and prednisone  from last visit, will give this again.  Encouraged orthopedic follow-up, they can consider physical therapy and further evaluation.  Plan of care, follow-up care return precautions given,  no questions at this time.    Final Clinical Impressions(s) / UC Diagnoses   Final diagnoses:  Right-sided low back pain with right-sided sciatica, unspecified chronicity     Discharge Instructions      We have given you an injection to help with your low back / hip pain. Please follow-up with orthopedics for further guidance on your pain, they may recommend physical therapy. It is important that you get your walker fixed, use this when ambulating to help reduce the risk of falls.  Seek immediate care for severe pain or new concerning symptoms at the nearest emergency department for further evaluation.    ED Prescriptions     Medication Sig Dispense Auth. Provider   predniSONE  (DELTASONE ) 20 MG tablet Take 2 tablets (40 mg total) by mouth daily with breakfast for 5 days. 10 tablet Dreama, Enyah Moman  N, FNP      PDMP not reviewed this encounter.   Dreama, Hani Campusano  N, FNP 04/15/24 1001

## 2024-04-15 NOTE — Discharge Instructions (Addendum)
 We have given you an injection to help with your low back / hip pain. Please follow-up with orthopedics for further guidance on your pain, they may recommend physical therapy. It is important that you get your walker fixed, use this when ambulating to help reduce the risk of falls.  Seek immediate care for severe pain or new concerning symptoms at the nearest emergency department for further evaluation.

## 2024-05-05 ENCOUNTER — Ambulatory Visit: Payer: Self-pay

## 2024-05-05 VITALS — BP 119/79 | Ht 71.0 in | Wt 138.0 lb

## 2024-05-05 NOTE — Progress Notes (Unsigned)
 I connected with  Leonard Ramos on 05/05/2024 by a audio enabled telemedicine application and verified that I am speaking with the correct person using two identifiers.  Patient Location: Home  Provider Location: Home Office  Persons Participating in Visit: Patient.  I discussed the limitations of evaluation and management by telemedicine. The patient expressed understanding and agreed to proceed.  Vital Signs: Because this visit was a virtual/telehealth visit, some criteria may be missing or patient reported. Any vitals not documented were not able to be obtained and vitals that have been documented are patient reported.;  This visit was performed by a medical professional under my direct supervision. I was immediately available for consultation/collaboration. I have reviewed and agree with the Annual Wellness Visit documentation.  Chief Complaint  Patient presents with   Medicare Wellness     Subjective:   Leonard Ramos is a 68 y.o. male who presents for a Medicare Annual Wellness Visit.  Fall Screening Patient Fall Risk Level: Low fall risk    Allergies (verified) Trazodone  and nefazodone   Current Medications (verified) Outpatient Encounter Medications as of 05/05/2024  Medication Sig   folic acid  (FOLVITE ) 1 MG tablet Take 1 tablet (1 mg total) by mouth daily.   gabapentin  (NEURONTIN ) 600 MG tablet TAKE 1 TABLET BY MOUTH 3 TIMES  DAILY   HYDROcodone -acetaminophen  (NORCO/VICODIN) 5-325 MG tablet Take 1-2 tablets by mouth every 4 (four) hours as needed for moderate pain or severe pain ((score 4 to 6)).   ibuprofen  (ADVIL ) 800 MG tablet Take 1 tablet (800 mg total) by mouth every 6 (six) hours as needed.   mirtazapine  (REMERON ) 7.5 MG tablet Take 7.5 mg by mouth at bedtime.   omeprazole  (PRILOSEC) 20 MG capsule Take 1 capsule (20 mg total) by mouth daily. (Patient taking differently: Take 20 mg by mouth every morning.)   sildenafil  (VIAGRA ) 100 MG tablet Take 0.5-1  tablets (50-100 mg total) by mouth daily as needed for erectile dysfunction.   traZODone  (DESYREL ) 50 MG tablet TAKE 1/2 TO 1 TABLET BY MOUTH AT BEDTIME AS NEEDED FOR SLEEP   pravastatin  (PRAVACHOL ) 20 MG tablet TAKE 1 TABLET BY MOUTH DAILY (Patient not taking: Reported on 05/05/2024)   No facility-administered encounter medications on file as of 05/05/2024.    History: Past Medical History:  Diagnosis Date   Alcohol  use    drinks 2 40's per day   Anxiety    Aortic atherosclerosis    Arthritis    Cervical spondylosis with myelopathy and radiculopathy    Chronic low back pain    Cocaine abuse (HCC)    last used 01-09-23   Depression    ED (erectile dysfunction)    Full dentures    GERD (gastroesophageal reflux disease)    Hammertoe of left foot    Hyperlipidemia    Liver disease    Lumbosacral spondylolysis    Marijuana abuse    uses marijuana everyday   Neuropathy    hands tingling and feet numbness   Periodontitis, chronic, generalized    Poor dentition    Substance abuse (HCC)    Tobacco use    Vitamin D  deficiency 03/2019   Past Surgical History:  Procedure Laterality Date   ANTERIOR CERVICAL DECOMP/DISCECTOMY FUSION N/A 02/28/2021   Procedure: Anterior Cervical Discectomy Fusion - Cervical three-Cervical four - Cervical four-Cervical five - Cervical five-Cervical six;  Surgeon: Louis Shove, MD;  Location: [facility] OR;  Service: Neurosurgery;  Laterality: N/A;   CAPSULOTOMY Left 01/14/2023  Procedure: CAPSULOTOMY MPJ RELEASE;  Surgeon: Tobie Franky SQUIBB, DPM;  Location: [facility] ORS;  Service: Orthopedics/Podiatry;  Laterality: Left;   HALLUX VALGUS LAPIDUS Left 01/14/2023   Procedure: HALLUX MPJ FUSION;  Surgeon: Tobie Franky SQUIBB, DPM;  Location: [facility] ORS;  Service: Orthopedics/Podiatry;  Laterality: Left;   HAMMER TOE SURGERY Left 01/14/2023   Procedure: HAMMER TOE CORRECTION;  Surgeon: Tobie Franky SQUIBB, DPM;  Location: [facility] ORS;  Service: Orthopedics/Podiatry;  Laterality: Left;    LUMBAR LAMINECTOMY/DECOMPRESSION MICRODISCECTOMY N/A 04/09/2022   Procedure: Lumbar Two-Three, Lumbar Three-Four, Lumbar Four-Five Laminectomy and Foraminotomy;  Surgeon: Louis Shove, MD;  Location: [facility] OR;  Service: Neurosurgery;  Laterality: N/A;   LUMBAR WOUND DEBRIDEMENT N/A 04/09/2022   Procedure: Reexploration of Lumbar wound to remove hematoma;  Surgeon: Louis Shove, MD;  Location: [facility] OR;  Service: Neurosurgery;  Laterality: N/A;   MULTIPLE EXTRACTIONS WITH ALVEOLOPLASTY Bilateral 09/04/2019   Procedure: MULTIPLE EXTRACTION W/ ALVEOLOPLASTY, Removal of left maxillary buccal exostosis;  Surgeon: Sheryle Hamilton, DDS;  Location: [facility] OR;  Service: Oral Surgery;  Laterality: Bilateral;   Family History  Problem Relation Age of Onset   CAD Father    Social History   Occupational History   Not on file  Tobacco Use   Smoking status: Every Day    Current packs/day: 1.00    Average packs/day: 1 pack/day for 40.0 years (40.0 ttl pk-yrs)    Types: Cigarettes   Smokeless tobacco: Never  Vaping Use   Vaping status: Never Used  Substance and Sexual Activity   Alcohol  use: Yes    Alcohol /week: 40.0 standard drinks of alcohol     Types: 40 Standard drinks or equivalent per week    Comment: States I drink beer all day long. Patient states he drinks 2 40's per day   Drug use: Yes    Types: Marijuana, Cocaine    Comment: Patient states I smoke marijuana everyday.  Last use cocaine 01-09-23   Sexual activity: Yes    Partners: Female   Tobacco Counseling Ready to quit: Not Answered Counseling given: Not Answered  SDOH Screenings   Food Insecurity: No Food Insecurity (10/18/2022)  Housing: Low Risk (10/18/2022)  Transportation Needs: No Transportation Needs (10/18/2022)  Utilities: Not At Risk (10/18/2022)  Alcohol  Screen: Low Risk (10/18/2022)  Depression (PHQ2-9): Low Risk (08/19/2023)  Financial Resource Strain: Low Risk (10/18/2022)  Physical Activity: Sufficiently Active (10/18/2022)   Social Connections: Socially Integrated (10/18/2022)  Stress: No Stress Concern Present (10/18/2022)  Tobacco Use: High Risk (05/05/2024)   See flowsheets for full screening details  Depression Screen PHQ 2 & 9 Depression Scale- Over the past 2 weeks, how often have you been bothered by any of the following problems? Little interest or pleasure in doing things: 0 Feeling down, depressed, or hopeless (PHQ Adolescent also includes...irritable): 0 PHQ-2 Total Score: 0     Goals Addressed             This Visit's Progress    Patient Stated       Patient would like to hit the lottery              Objective:    Today's Vitals   05/05/24 0916  BP: 119/79  Weight: 138 lb (62.6 kg)  Height: 5' 11 (1.803 m)   Body mass index is 19.25 kg/m.  Hearing/Vision screen Hearing Screening - Comments:: Patient has no difficulties  Immunizations and Health Maintenance Health Maintenance  Topic Date Due   Fecal DNA (Cologuard)  Never done  Lung Cancer Screening  Never done   Zoster Vaccines- Shingrix (1 of 2) Never done   Medicare Annual Wellness (AWV)  10/18/2023   Influenza Vaccine  12/20/2023   COVID-19 Vaccine (2 - 2025-26 season) 01/20/2024   DTaP/Tdap/Td (2 - Td or Tdap) 03/08/2026   Pneumococcal Vaccine: 50+ Years (3 of 3 - PCV20 or PCV21) 08/16/2027   Hepatitis C Screening  Completed   Meningococcal B Vaccine  Aged Out        Assessment/Plan:  This is a routine wellness examination for [place].  Patient Care Team: Oley Bascom RAMAN, NP as PCP - General (Pulmonary Disease) Pietro Redell RAMAN, MD as PCP - Cardiology (Cardiology) Tobie Tonita POUR, DO as Consulting Physician (Neurology) Loreda Hacker, DPM as Consulting Physician (Podiatry) Louis Shove, MD as Consulting Physician (Neurosurgery) Patel, Donika K, DO as Consulting Physician (Neurology)  I have personally reviewed and noted the following in the patients chart:   Medical and social history Use of  alcohol , tobacco or illicit drugs  Current medications and supplements including opioid prescriptions. Functional ability and status Nutritional status Physical activity Advanced directives List of other physicians Hospitalizations, surgeries, and ER visits in previous 12 months Vitals Screenings to include cognitive, depression, and falls Referrals and appointments  No orders of the defined types were placed in this encounter.  In addition, I have reviewed and discussed with patient certain preventive protocols, quality metrics, and best practice recommendations. A written personalized care plan for preventive services as well as general preventive health recommendations were provided to patient.   Lyle POUR Right, [place]   05/05/2024   No follow-ups on file.  After Visit Summary: (MyChart) Due to this being a telephonic visit, the after visit summary with patients personalized plan was offered to patient via MyChart   Nurse Notes: nothing to report

## 2024-05-05 NOTE — Patient Instructions (Signed)
 Leonard Ramos,  Thank you for taking the time for your Medicare Wellness Visit. I appreciate your continued commitment to your health goals. Please review the care plan we discussed, and feel free to reach out if I can assist you further.  Please note that Annual Wellness Visits do not include a physical exam. Some assessments may be limited, especially if the visit was conducted virtually. If needed, we may recommend an in-person follow-up with your provider.  Ongoing Care Seeing your primary care provider every 3 to 6 months helps us  monitor your health and provide consistent, personalized care.   Referrals If a referral was made during today's visit and you haven't received any updates within two weeks, please contact the referred provider directly to check on the status.  Recommended Screenings:  Health Maintenance  Topic Date Due   Cologuard (Stool DNA test)  Never done   Screening for Lung Cancer  Never done   Zoster (Shingles) Vaccine (1 of 2) Never done   Medicare Annual Wellness Visit  10/18/2023   Flu Shot  12/20/2023   COVID-19 Vaccine (2 - 2025-26 season) 01/20/2024   DTaP/Tdap/Td vaccine (2 - Td or Tdap) 03/08/2026   Pneumococcal Vaccine for age over 38 (3 of 3 - PCV20 or PCV21) 08/16/2027   Hepatitis C Screening  Completed   Meningitis B Vaccine  Aged Out       01/19/2023    5:24 PM  Advanced Directives  Does Patient Have a Medical Advance Directive? No    Vision: Annual vision screenings are recommended for early detection of glaucoma, cataracts, and diabetic retinopathy. These exams can also reveal signs of chronic conditions such as diabetes and high blood pressure.  Dental: Annual dental screenings help detect early signs of oral cancer, gum disease, and other conditions linked to overall health, including heart disease and diabetes.  Please see the attached documents for additional preventive care recommendations.

## 2024-06-06 ENCOUNTER — Encounter (HOSPITAL_COMMUNITY): Payer: Self-pay | Admitting: *Deleted

## 2024-06-06 ENCOUNTER — Ambulatory Visit (HOSPITAL_COMMUNITY)
Admission: EM | Admit: 2024-06-06 | Discharge: 2024-06-06 | Disposition: A | Discharge status: Home / Self Care | Attending: Emergency Medicine | Admitting: Emergency Medicine

## 2024-06-06 DIAGNOSIS — M5441 Lumbago with sciatica, right side: Secondary | ICD-10-CM

## 2024-06-06 MED ORDER — DEXAMETHASONE SOD PHOSPHATE PF 10 MG/ML IJ SOLN
INTRAMUSCULAR | Status: AC
  Filled 2024-06-06: qty 1

## 2024-06-06 MED ORDER — DEXAMETHASONE SOD PHOSPHATE PF 10 MG/ML IJ SOLN
10.0000 mg | Freq: Once | INTRAMUSCULAR | Status: AC
  Administered 2024-06-06: 10 mg via INTRAMUSCULAR

## 2024-06-06 NOTE — Discharge Instructions (Addendum)
 We have given you a steroid injection here in clinic to help with your pain.  Do not take any more ibuprofen  as this combined with the steroid can increase your risk for gastrointestinal bleeding.  For any breakthrough pain you can take Tylenol  as needed.  If the pain persists or returns follow-up with orthopedic for further evaluation.

## 2024-06-06 NOTE — ED Provider Notes (Signed)
 " [facility]    CSN: [id number] Arrival date & time: 06/06/24  1109      History   Chief Complaint Chief Complaint  Patient presents with   Back Pain   Leg Pain    HPI Leonard Ramos is a 69 y.o. male.   Patient presents to clinic over concern of right-sided low back pain that radiates down his right leg.  Had similar episode at the end of last year.  This has been flared up for the past 1 to 2 weeks.    He has not had any trauma, injury or falls.  Denies urinary symptoms.  Denies rash.  Denies inner leg numbness or incontinence.  He does have a walker but has not been using it because the wheel fell off and he thinks this may be contributing to his pain flareup.  Has tried ibuprofen  without any improvement.  Has not been able to follow-up with orthopedic or his primary care doctor.   The history is provided by the patient and medical records.  Back Pain Associated symptoms: leg pain   Leg Pain   Past Medical History:  Diagnosis Date   Alcohol  use    drinks 2 40's per day   Anxiety    Aortic atherosclerosis    Arthritis    Cervical spondylosis with myelopathy and radiculopathy    Chronic low back pain    Cocaine abuse (HCC)    last used 01-09-23   Depression    ED (erectile dysfunction)    Full dentures    GERD (gastroesophageal reflux disease)    Hammertoe of left foot    Hyperlipidemia    Liver disease    Lumbosacral spondylolysis    Marijuana abuse    uses marijuana everyday   Neuropathy    hands tingling and feet numbness   Periodontitis, chronic, generalized    Poor dentition    Substance abuse (HCC)    Tobacco use    Vitamin D  deficiency 03/2019    Patient Active Problem List   Diagnosis Date Noted   Chronic low back pain 08/16/2022   Pain due to onychomycosis of toenails of both feet 05/02/2022   Hav (hallux abducto valgus), unspecified laterality 05/02/2022   Hammer toe of second toe of left foot 05/02/2022   Callus between toes  04/27/2022   Lumbar stenosis with neurogenic claudication 04/09/2022   Lumbosacral spondylolysis 01/26/2022   Cervical spondylosis with myelopathy and radiculopathy 02/28/2021   Tobacco use 07/12/2019   History of hyperglycemia 07/12/2019   Erectile dysfunction 07/12/2019   Chronic left-sided low back pain without sciatica 04/10/2019   Alcohol  use 04/10/2019   Poor dentition 04/10/2019   Chronic dental pain 04/10/2019   Urinary frequency 04/10/2019   Aortic atherosclerosis 03/19/2017   Generalized anxiety disorder 10/07/2015   Depression 10/07/2015   Neuropathy 09/06/2015   Loss of weight 09/06/2015   Tobacco dependence 09/06/2015   Bowel incontinence 03/09/2015   ETOH abuse 03/09/2015   Left sided numbness 03/08/2015   Left-sided weakness 03/08/2015   Absence of bladder continence 03/08/2015    Past Surgical History:  Procedure Laterality Date   ANTERIOR CERVICAL DECOMP/DISCECTOMY FUSION N/A 02/28/2021   Procedure: Anterior Cervical Discectomy Fusion - Cervical three-Cervical four - Cervical four-Cervical five - Cervical five-Cervical six;  Surgeon: Louis Shove, MD;  Location: [facility] OR;  Service: Neurosurgery;  Laterality: N/A;   CAPSULOTOMY Left 01/14/2023   Procedure: CAPSULOTOMY MPJ RELEASE;  Surgeon: Tobie Franky SQUIBB, DPM;  Location:  [facility] ORS;  Service: Orthopedics/Podiatry;  Laterality: Left;   HALLUX VALGUS LAPIDUS Left 01/14/2023   Procedure: HALLUX MPJ FUSION;  Surgeon: Tobie Franky SQUIBB, DPM;  Location: [facility] ORS;  Service: Orthopedics/Podiatry;  Laterality: Left;   HAMMER TOE SURGERY Left 01/14/2023   Procedure: HAMMER TOE CORRECTION;  Surgeon: Tobie Franky SQUIBB, DPM;  Location: [facility] ORS;  Service: Orthopedics/Podiatry;  Laterality: Left;   LUMBAR LAMINECTOMY/DECOMPRESSION MICRODISCECTOMY N/A 04/09/2022   Procedure: Lumbar Two-Three, Lumbar Three-Four, Lumbar Four-Five Laminectomy and Foraminotomy;  Surgeon: Louis Shove, MD;  Location: [facility] OR;  Service: Neurosurgery;  Laterality: N/A;    LUMBAR WOUND DEBRIDEMENT N/A 04/09/2022   Procedure: Reexploration of Lumbar wound to remove hematoma;  Surgeon: Louis Shove, MD;  Location: [facility] OR;  Service: Neurosurgery;  Laterality: N/A;   MULTIPLE EXTRACTIONS WITH ALVEOLOPLASTY Bilateral 09/04/2019   Procedure: MULTIPLE EXTRACTION W/ ALVEOLOPLASTY, Removal of left maxillary buccal exostosis;  Surgeon: Sheryle Hamilton, DDS;  Location: [facility] OR;  Service: Oral Surgery;  Laterality: Bilateral;       Home Medications    Prior to Admission medications  Medication Sig Start Date End Date Taking? Authorizing Provider  folic acid  (FOLVITE ) 1 MG tablet Take 1 tablet (1 mg total) by mouth daily. 04/18/20  Yes Stroud, Natalie M, FNP  gabapentin  (NEURONTIN ) 600 MG tablet TAKE 1 TABLET BY MOUTH 3 TIMES  DAILY 03/06/23  Yes Nichols, Tonya S, NP  mirtazapine  (REMERON ) 7.5 MG tablet Take 7.5 mg by mouth at bedtime. 11/27/23  Yes [provider]  traZODone  (DESYREL ) 50 MG tablet TAKE 1/2 TO 1 TABLET BY MOUTH AT BEDTIME AS NEEDED FOR SLEEP 12/09/23  Yes Nichols, Tonya S, NP  HYDROcodone -acetaminophen  (NORCO/VICODIN) 5-325 MG tablet Take 1-2 tablets by mouth every 4 (four) hours as needed for moderate pain or severe pain ((score 4 to 6)). 04/10/22   Bergman, Meghan D, NP  ibuprofen  (ADVIL ) 800 MG tablet Take 1 tablet (800 mg total) by mouth every 6 (six) hours as needed. 02/18/23   Oley Bascom RAMAN, NP  omeprazole  (PRILOSEC) 20 MG capsule Take 1 capsule (20 mg total) by mouth daily. Patient taking differently: Take 20 mg by mouth every morning. 04/27/22   Oley Bascom RAMAN, NP  pravastatin  (PRAVACHOL ) 20 MG tablet TAKE 1 TABLET BY MOUTH DAILY Patient not taking: Reported on 05/05/2024 10/18/23   Oley Bascom RAMAN, NP  sildenafil  (VIAGRA ) 100 MG tablet Take 0.5-1 tablets (50-100 mg total) by mouth daily as needed for erectile dysfunction. 02/18/23   Oley Bascom RAMAN, NP    Family History Family History  Problem Relation Age of Onset   CAD Father      Social History Social History[1]   Allergies   Trazodone  and nefazodone   Review of Systems Review of Systems  Per HPI  Physical Exam Triage Vital Signs ED Triage Vitals  Encounter Vitals Group     BP 06/06/24 1224 110/77     Girls Systolic BP Percentile --      Girls Diastolic BP Percentile --      Boys Systolic BP Percentile --      Boys Diastolic BP Percentile --      Pulse Rate 06/06/24 1224 99     Resp 06/06/24 1224 16     Temp 06/06/24 1224 98 F (36.7 C)     Temp Source 06/06/24 1224 Oral     SpO2 06/06/24 1224 94 %     Weight --      Height --  Head Circumference --      Peak Flow --      Pain Score 06/06/24 1222 10     Pain Loc --      Pain Education --      Exclude from Growth Chart --    No data found.  Updated Vital Signs BP 110/77 (BP Location: Left Arm)   Pulse 99   Temp 98 F (36.7 C) (Oral)   Resp 16   SpO2 94%   Visual Acuity Right Eye Distance:   Left Eye Distance:   Bilateral Distance:    Right Eye Near:   Left Eye Near:    Bilateral Near:     Physical Exam Vitals and nursing note reviewed.  Constitutional:      Appearance: Normal appearance.  HENT:     Head: Normocephalic and atraumatic.     Right Ear: External ear normal.     Left Ear: External ear normal.     Nose: Nose normal.     Mouth/Throat:     Mouth: Mucous membranes are moist.  Eyes:     Conjunctiva/sclera: Conjunctivae normal.  Cardiovascular:     Rate and Rhythm: Normal rate.  Pulmonary:     Effort: Pulmonary effort is normal. No respiratory distress.  Musculoskeletal:        General: Tenderness present. No swelling or signs of injury.     Thoracic back: No bony tenderness.     Lumbar back: No bony tenderness. Positive right straight leg raise test.       Back:  Skin:    General: Skin is warm and dry.     Findings: No rash.  Neurological:     General: No focal deficit present.     Mental Status: He is alert.  Psychiatric:        Mood and  Affect: Mood normal.      UC Treatments / Results  Labs (all labs ordered are listed, but only abnormal results are displayed) Labs Reviewed - No data to display   EKG   Radiology No results found.  Procedures Procedures (including critical care time)  Medications Ordered in UC Medications  dexamethasone  (DECADRON ) injection 10 mg (has no administration in time range)    Initial Impression / Assessment and Plan / UC Course  I have reviewed the triage vital signs and the nursing notes.  Pertinent labs & imaging results that were available during my care of the patient were reviewed by me and considered in my medical decision making (see chart for details).  Vitals and triage reviewed, patient is hemodynamically stable.  Right-sided low back pain is reproducible with range of motion and with right-sided straight leg raise.  Appears consistent with sciatica.  Will trial IM steroid here in clinic.  Encouraged orthopedic follow-up.  Imaging deferred, atraumatic and without lumbar step-off, crepitus, deformity or tenderness to palpation.  Without evidence of cauda equina.  Plan of care, follow-up care, and return precautions given, no questions at this time.    Final Clinical Impressions(s) / UC Diagnoses   Final diagnoses:  Right-sided low back pain with right-sided sciatica, unspecified chronicity     Discharge Instructions      We have given you a steroid injection here in clinic to help with your pain.  Do not take any more ibuprofen  as this combined with the steroid can increase your risk for gastrointestinal bleeding.  For any breakthrough pain you can take Tylenol  as needed.  If the pain persists  or returns follow-up with orthopedic for further evaluation.     ED Prescriptions   None    PDMP not reviewed this encounter.     [1]  Social History Tobacco Use   Smoking status: Every Day    Current packs/day: 1.00    Average packs/day: 1 pack/day for 40.0  years (40.0 ttl pk-yrs)    Types: Cigarettes   Smokeless tobacco: Never  Vaping Use   Vaping status: Never Used  Substance Use Topics   Alcohol  use: Yes    Alcohol /week: 40.0 standard drinks of alcohol     Types: 40 Standard drinks or equivalent per week    Comment: States I drink beer all day long. Patient states he drinks 2 40's per day   Drug use: Yes    Types: Marijuana, Cocaine    Comment: Patient states I smoke marijuana everyday.  Last use cocaine 01-09-23     Mercer Dagmar MATSU, FNP 06/06/24 1258  "

## 2024-06-06 NOTE — ED Triage Notes (Signed)
 Pt states he has right sided back pain that radiates down his leg X 1-2 weeks. He has been taking IBU.

## 2024-06-10 ENCOUNTER — Ambulatory Visit: Admitting: Family Medicine

## 2024-06-12 ENCOUNTER — Ambulatory Visit (INDEPENDENT_AMBULATORY_CARE_PROVIDER_SITE_OTHER): Admitting: Family Medicine

## 2024-06-12 VITALS — BP 129/84 | Ht 71.0 in | Wt 139.0 lb

## 2024-06-12 DIAGNOSIS — M5416 Radiculopathy, lumbar region: Secondary | ICD-10-CM | POA: Diagnosis not present

## 2024-06-12 MED ORDER — GABAPENTIN 600 MG PO TABS: 600.0000 mg | ORAL_TABLET | Freq: Every day | ORAL | 3 refills | Status: AC

## 2024-06-12 MED ORDER — METHYLPREDNISOLONE 4 MG PO TBPK: ORAL_TABLET | ORAL | 0 refills | Status: AC

## 2024-06-12 NOTE — Progress Notes (Signed)
 " PCP: Oley Bascom RAMAN, NP  Subjective:   HPI: Patient is a 69 y.o. male here for right sided low back pain radiating down towards the right lower leg.  Patient has a pertinent medical history of lumbar pain and surgery including laminectomy and foraminotomies done in 2023 by Dr. Louis.    Over the last year he has had increased right sided low back pain which he states radiates down to his right leg both anterior and posterior down towards his distal leg.  He states that he has felt his right leg giving out before and has had some falls but no injuries.  He denies any syncope.  He recently saw an urgent care who gave him a steroid injection which did not help his pain.  He takes Tylenol  periodically.  He is currently not taking any anti-inflammatories.  He denies any bowel or bladder incontinence.  He denies any groin numbness.  Past Medical History:  Diagnosis Date   Alcohol  use    drinks 2 40's per day   Anxiety    Aortic atherosclerosis    Arthritis    Cervical spondylosis with myelopathy and radiculopathy    Chronic low back pain    Cocaine abuse (HCC)    last used 01-09-23   Depression    ED (erectile dysfunction)    Full dentures    GERD (gastroesophageal reflux disease)    Hammertoe of left foot    Hyperlipidemia    Liver disease    Lumbosacral spondylolysis    Marijuana abuse    uses marijuana everyday   Neuropathy    hands tingling and feet numbness   Periodontitis, chronic, generalized    Poor dentition    Substance abuse (HCC)    Tobacco use    Vitamin D  deficiency 03/2019    Medications Ordered Prior to Encounter[1]  BP 129/84   Ht 5' 11 (1.803 m)   Wt 139 lb (63 kg)   BMI 19.39 kg/m        Objective:   Physical Exam:  Gen: NAD, comfortable in exam room Lumbar  Inspection: No erythema, edema, warmth or sores Palpation: TTP at the right paraspinal lumbar region Special Tests: Positive straight leg test on the right Neuro: Bilateral lower extremity  sensation is equal and intact, strength bilaterally is equal although he is weak with hip flexion, knee extension, plantarflexion and dorsiflexion at 4/5, DTRs the patella tendon are not reactive bilaterally, Achilles tendons 1+ bilaterally  Assessment/Plan:   Leonard Ramos is a 69 y.o. male who was seen today for the following: 1. Lumbar radiculopathy (Primary) - MR Lumbar Spine Wo Contrast; Future - methylPREDNISolone  (MEDROL  DOSEPAK) 4 MG TBPK tablet; Take as written  Dispense: 21 tablet; Refill: 0 - gabapentin  (NEURONTIN ) 600 MG tablet; Take 1 tablet (600 mg total) by mouth at bedtime.  Dispense: 30 tablet; Refill: 3 - Patient has worsening symptoms of radiculopathy over the last year - Previous laminectomy and foraminotomies in 2023 multiple levels - Neuroexam significant for positive straight leg test on the right - Strength and sensation are equal although he is weak bilaterally with absent patellar reflex - Due to these findings we will pursue MRI of the lumbar spine - We will send in a steroid Dosepak for relief of pain - We will also increase his gabapentin  to 1200 mg at night and 600 mg twice during the day daily - Follow-up in 2 weeks  Follow-up/Education:   No follow-ups on file.  May return sooner as needed and encouraged to call/e-mail for additional questions or  worsening symptoms in the interim.  Krystal Lowing, DO Sports Medicine Fellow 06/12/2024 11:36 AM     [1]  Current Outpatient Medications on File Prior to Visit  Medication Sig Dispense Refill   folic acid  (FOLVITE ) 1 MG tablet Take 1 tablet (1 mg total) by mouth daily. 90 tablet 3   gabapentin  (NEURONTIN ) 600 MG tablet TAKE 1 TABLET BY MOUTH 3 TIMES  DAILY 300 tablet 2   HYDROcodone -acetaminophen  (NORCO/VICODIN) 5-325 MG tablet Take 1-2 tablets by mouth every 4 (four) hours as needed for moderate pain or severe pain ((score 4 to 6)). 30 tablet 0   ibuprofen  (ADVIL ) 800 MG tablet Take 1 tablet (800 mg  total) by mouth every 6 (six) hours as needed. 60 tablet 1   mirtazapine  (REMERON ) 7.5 MG tablet Take 7.5 mg by mouth at bedtime.     omeprazole  (PRILOSEC) 20 MG capsule Take 1 capsule (20 mg total) by mouth daily. (Patient taking differently: Take 20 mg by mouth every morning.) 30 capsule 0   pravastatin  (PRAVACHOL ) 20 MG tablet TAKE 1 TABLET BY MOUTH DAILY (Patient not taking: Reported on 05/05/2024) 100 tablet 2   sildenafil  (VIAGRA ) 100 MG tablet Take 0.5-1 tablets (50-100 mg total) by mouth daily as needed for erectile dysfunction. 5 tablet 11   traZODone  (DESYREL ) 50 MG tablet TAKE 1/2 TO 1 TABLET BY MOUTH AT BEDTIME AS NEEDED FOR SLEEP 30 tablet 11   No current facility-administered medications on file prior to visit.   "

## 2024-06-12 NOTE — Progress Notes (Signed)
 [facility]: Attending Note: I have examined the patient, reviewed the chart, discussed the assessment and plan with the Sports Medicine Fellow. I agree with assessment and treatment plan as detailed in the Fellow's note.  History of prior lumbar surgery (laminectomy and foraminotomy) Symptoms concerning for worsening degenerative disc disease as well as concern for spinal stenosis, progression of myelopathy  Agree that surgery is last option.  Need updated MRI to make ultimate determination thigh as he has significant pain and weakness Will try steroid burst with taper to see if we can alleviate pain, improve symptoms some Also try increase night time dose of gabapentin . F/u 2 weeks Schedule MRI

## 2024-06-26 ENCOUNTER — Telehealth: Payer: Self-pay

## 2024-06-26 ENCOUNTER — Other Ambulatory Visit

## 2024-06-26 ENCOUNTER — Ambulatory Visit

## 2024-06-26 MED ORDER — DIAZEPAM 5 MG PO TABS: ORAL_TABLET | ORAL | 1 refills | Status: AC

## 2024-06-26 NOTE — Telephone Encounter (Signed)
-----   Message from [place] C sent at 06/26/2024 11:46 AM EST ----- Pt was unable to complete MRI, He is asking If we can send in valium . He is going to call and reschedule his appt.

## 2024-06-26 NOTE — Telephone Encounter (Signed)
 Sure! I will send in some diazepam  (Valium ) He will need a driver to take him and bring him home. Sig will be on bottle

## 2024-06-26 NOTE — Telephone Encounter (Signed)
Please let him know THANKS! Dorcas Mcmurray
